# Patient Record
Sex: Male | Born: 1965 | Race: White | Hispanic: No | Marital: Married
Health system: Southern US, Community
[De-identification: ages and names within clinical notes are randomized; demographics above are authoritative.]

## PROBLEM LIST (undated history)

## (undated) DIAGNOSIS — I10 Essential (primary) hypertension: Secondary | ICD-10-CM

## (undated) DIAGNOSIS — I4891 Unspecified atrial fibrillation: Secondary | ICD-10-CM

## (undated) DIAGNOSIS — N289 Disorder of kidney and ureter, unspecified: Secondary | ICD-10-CM

## (undated) DIAGNOSIS — R51 Headache: Secondary | ICD-10-CM

## (undated) DIAGNOSIS — T8859XA Other complications of anesthesia, initial encounter: Secondary | ICD-10-CM

## (undated) DIAGNOSIS — T4145XA Adverse effect of unspecified anesthetic, initial encounter: Secondary | ICD-10-CM

## (undated) DIAGNOSIS — N189 Chronic kidney disease, unspecified: Secondary | ICD-10-CM

## (undated) DIAGNOSIS — D696 Thrombocytopenia, unspecified: Secondary | ICD-10-CM

## (undated) DIAGNOSIS — I770 Arteriovenous fistula, acquired: Secondary | ICD-10-CM

## (undated) DIAGNOSIS — M542 Cervicalgia: Secondary | ICD-10-CM

## (undated) DIAGNOSIS — N186 End stage renal disease: Secondary | ICD-10-CM

## (undated) DIAGNOSIS — K219 Gastro-esophageal reflux disease without esophagitis: Secondary | ICD-10-CM

## (undated) DIAGNOSIS — F419 Anxiety disorder, unspecified: Secondary | ICD-10-CM

## (undated) DIAGNOSIS — D649 Anemia, unspecified: Secondary | ICD-10-CM

## (undated) DIAGNOSIS — F1010 Alcohol abuse, uncomplicated: Secondary | ICD-10-CM

## (undated) DIAGNOSIS — D869 Sarcoidosis, unspecified: Secondary | ICD-10-CM

## (undated) DIAGNOSIS — J189 Pneumonia, unspecified organism: Secondary | ICD-10-CM

## (undated) HISTORY — PX: COLONOSCOPY: SHX174

## (undated) HISTORY — PX: WISDOM TOOTH EXTRACTION: SHX21

## (undated) HISTORY — PX: KNEE ARTHROSCOPY: SUR90

## (undated) HISTORY — PX: AV FISTULA PLACEMENT: SHX1204

## (undated) HISTORY — DX: End stage renal disease: N18.6

## (undated) HISTORY — DX: Cervicalgia: M54.2

## (undated) HISTORY — PX: TONSILLECTOMY: SUR1361

---

## 2006-08-29 ENCOUNTER — Ambulatory Visit (HOSPITAL_BASED_OUTPATIENT_CLINIC_OR_DEPARTMENT_OTHER): Admission: RE | Admit: 2006-08-29 | Discharge: 2006-08-29 | Payer: Self-pay | Admitting: Orthopaedic Surgery

## 2012-09-19 ENCOUNTER — Inpatient Hospital Stay (HOSPITAL_COMMUNITY)
Admission: EM | Admit: 2012-09-19 | Discharge: 2012-09-20 | DRG: 316 | Disposition: A | Discharge status: Home / Self Care | Payer: BC Managed Care – PPO | Attending: Internal Medicine | Admitting: Internal Medicine

## 2012-09-19 ENCOUNTER — Encounter (HOSPITAL_COMMUNITY): Payer: Self-pay

## 2012-09-19 DIAGNOSIS — N179 Acute kidney failure, unspecified: Principal | ICD-10-CM | POA: Diagnosis present

## 2012-09-19 DIAGNOSIS — D709 Neutropenia, unspecified: Secondary | ICD-10-CM | POA: Diagnosis present

## 2012-09-19 DIAGNOSIS — F419 Anxiety disorder, unspecified: Secondary | ICD-10-CM

## 2012-09-19 DIAGNOSIS — E872 Acidosis, unspecified: Secondary | ICD-10-CM | POA: Diagnosis present

## 2012-09-19 DIAGNOSIS — I10 Essential (primary) hypertension: Secondary | ICD-10-CM | POA: Diagnosis present

## 2012-09-19 DIAGNOSIS — M549 Dorsalgia, unspecified: Secondary | ICD-10-CM | POA: Diagnosis present

## 2012-09-19 DIAGNOSIS — E871 Hypo-osmolality and hyponatremia: Secondary | ICD-10-CM | POA: Diagnosis present

## 2012-09-19 DIAGNOSIS — F411 Generalized anxiety disorder: Secondary | ICD-10-CM | POA: Diagnosis present

## 2012-09-19 DIAGNOSIS — D696 Thrombocytopenia, unspecified: Secondary | ICD-10-CM | POA: Diagnosis present

## 2012-09-19 HISTORY — DX: Essential (primary) hypertension: I10

## 2012-09-19 HISTORY — DX: Anxiety disorder, unspecified: F41.9

## 2012-09-19 LAB — URINALYSIS, ROUTINE W REFLEX MICROSCOPIC
Bilirubin Urine: NEGATIVE
Glucose, UA: NEGATIVE mg/dL
Hgb urine dipstick: NEGATIVE
Specific Gravity, Urine: 1.006 (ref 1.005–1.030)
Urobilinogen, UA: 0.2 mg/dL (ref 0.0–1.0)

## 2012-09-19 LAB — COMPREHENSIVE METABOLIC PANEL
ALT: 22 U/L (ref 0–53)
AST: 41 U/L — ABNORMAL HIGH (ref 0–37)
CO2: 15 mEq/L — ABNORMAL LOW (ref 19–32)
Calcium: 10.5 mg/dL (ref 8.4–10.5)
Chloride: 92 mEq/L — ABNORMAL LOW (ref 96–112)
GFR calc non Af Amer: 18 mL/min — ABNORMAL LOW (ref >90)
Sodium: 127 mEq/L — ABNORMAL LOW (ref 135–145)
Total Bilirubin: 0.4 mg/dL (ref 0.3–1.2)

## 2012-09-19 LAB — CBC WITH DIFFERENTIAL/PLATELET
Basophils Absolute: 0 10*3/uL (ref 0.0–0.1)
Lymphocytes Relative: 20 % (ref 12–46)
Neutro Abs: 2 10*3/uL (ref 1.7–7.7)
Platelets: 123 10*3/uL — ABNORMAL LOW (ref 150–400)
RDW: 12.2 % (ref 11.5–15.5)
WBC: 3.2 10*3/uL — ABNORMAL LOW (ref 4.0–10.5)

## 2012-09-19 MED ORDER — ADULT MULTIVITAMIN W/MINERALS CH
1.0000 | ORAL_TABLET | Freq: Every day | ORAL | Status: DC
  Administered 2012-09-19 – 2012-09-20 (×2): 1 via ORAL
  Filled 2012-09-19 (×2): qty 1

## 2012-09-19 MED ORDER — SODIUM BICARBONATE 650 MG PO TABS
650.0000 mg | ORAL_TABLET | Freq: Three times a day (TID) | ORAL | Status: DC
  Administered 2012-09-19 – 2012-09-20 (×2): 650 mg via ORAL
  Filled 2012-09-19 (×5): qty 1

## 2012-09-19 MED ORDER — CYCLOBENZAPRINE HCL 10 MG PO TABS: 10.0000 mg | ORAL_TABLET | Freq: Three times a day (TID) | ORAL | Status: DC | PRN

## 2012-09-19 MED ORDER — VITAMIN B-1 100 MG PO TABS
100.0000 mg | ORAL_TABLET | Freq: Every day | ORAL | Status: DC
  Administered 2012-09-19 – 2012-09-20 (×2): 100 mg via ORAL
  Filled 2012-09-19 (×2): qty 1

## 2012-09-19 MED ORDER — THIAMINE HCL 100 MG/ML IJ SOLN
100.0000 mg | Freq: Every day | INTRAMUSCULAR | Status: DC
  Filled 2012-09-19 (×2): qty 1

## 2012-09-19 MED ORDER — SODIUM CHLORIDE 0.9 % IV SOLN: INTRAVENOUS | Status: DC

## 2012-09-19 MED ORDER — ONDANSETRON HCL 4 MG/2ML IJ SOLN: 4.0000 mg | Freq: Four times a day (QID) | INTRAMUSCULAR | Status: DC | PRN

## 2012-09-19 MED ORDER — LORAZEPAM 2 MG/ML IJ SOLN: 1.0000 mg | Freq: Four times a day (QID) | INTRAMUSCULAR | Status: DC | PRN

## 2012-09-19 MED ORDER — ALBUTEROL SULFATE (5 MG/ML) 0.5% IN NEBU: 2.5000 mg | INHALATION_SOLUTION | RESPIRATORY_TRACT | Status: DC | PRN

## 2012-09-19 MED ORDER — GUAIFENESIN-DM 100-10 MG/5ML PO SYRP
5.0000 mL | ORAL_SOLUTION | ORAL | Status: DC | PRN
  Filled 2012-09-19: qty 5

## 2012-09-19 MED ORDER — ONDANSETRON HCL 4 MG PO TABS: 4.0000 mg | ORAL_TABLET | Freq: Four times a day (QID) | ORAL | Status: DC | PRN

## 2012-09-19 MED ORDER — HYDROCODONE-ACETAMINOPHEN 5-325 MG PO TABS: 1.0000 | ORAL_TABLET | ORAL | Status: DC | PRN

## 2012-09-19 MED ORDER — FOLIC ACID 1 MG PO TABS
1.0000 mg | ORAL_TABLET | Freq: Every day | ORAL | Status: DC
  Administered 2012-09-19 – 2012-09-20 (×2): 1 mg via ORAL
  Filled 2012-09-19 (×2): qty 1

## 2012-09-19 MED ORDER — SODIUM CHLORIDE 0.9 % IV SOLN
INTRAVENOUS | Status: AC
  Administered 2012-09-19: 125 mL via INTRAVENOUS

## 2012-09-19 MED ORDER — SODIUM CHLORIDE 0.9 % IV BOLUS (SEPSIS)
1000.0000 mL | Freq: Once | INTRAVENOUS | Status: AC
  Administered 2012-09-19: 1000 mL via INTRAVENOUS

## 2012-09-19 MED ORDER — ALPRAZOLAM 0.5 MG PO TABS: 1.0000 mg | ORAL_TABLET | Freq: Two times a day (BID) | ORAL | Status: DC | PRN

## 2012-09-19 MED ORDER — CHLORDIAZEPOXIDE HCL 5 MG PO CAPS
10.0000 mg | ORAL_CAPSULE | Freq: Three times a day (TID) | ORAL | Status: DC
  Administered 2012-09-19 – 2012-09-20 (×2): 10 mg via ORAL
  Filled 2012-09-19 (×2): qty 2

## 2012-09-19 MED ORDER — LORAZEPAM 1 MG PO TABS: 1.0000 mg | ORAL_TABLET | Freq: Four times a day (QID) | ORAL | Status: DC | PRN

## 2012-09-19 MED ORDER — SODIUM BICARBONATE 8.4 % IV SOLN
INTRAVENOUS | Status: DC
  Filled 2012-09-19 (×2): qty 100

## 2012-09-19 MED ORDER — HEPARIN SODIUM (PORCINE) 5000 UNIT/ML IJ SOLN
5000.0000 [IU] | Freq: Three times a day (TID) | INTRAMUSCULAR | Status: DC
  Administered 2012-09-19 – 2012-09-20 (×3): 5000 [IU] via SUBCUTANEOUS
  Filled 2012-09-19 (×5): qty 1

## 2012-09-19 NOTE — ED Provider Notes (Signed)
History     CSN: [id number]  Arrival date & time 09/19/12  1253   First MD Initiated Contact with Patient 09/19/12 1523      Chief Complaint  Patient presents with  . Labs Only    (Consider location/radiation/quality/duration/timing/severity/associated sxs/prior treatment) HPI Comments: Patient was sent to the ED by his PCP Shirline Frees after labs in the office revealed a Creatine of 4.07.  Patient presented to the office of his PCP today for a routine 6 month check up.   Patient does not have any previous history of Renal Failure.  He thinks that his Creatine was normal at his check up 6 months ago.  Patient denies regular NSAID use.  He states that he has not been dehydrated recently.  No nausea or vomiting.  He denies SOB.  Denies peripheral edema.  He states that he has been urinating normally.  He denies any new medications.    The history is provided by the patient.    Past Medical History  Diagnosis Date  . Anxiety     No past surgical history on file.  No family history on file.  History  Substance Use Topics  . Smoking status: Not on file  . Smokeless tobacco: Not on file  . Alcohol Use: No      Review of Systems  Constitutional: Negative for fever and chills.  Respiratory: Negative for shortness of breath.   Cardiovascular: Negative for chest pain.  Gastrointestinal: Negative for nausea, vomiting and diarrhea.  Genitourinary: Negative for dysuria, frequency, decreased urine volume and difficulty urinating.    Allergies  Review of patient's allergies indicates no known allergies.  Home Medications   Current Outpatient Rx  Name Route Sig Dispense Refill  . ALPRAZOLAM 1 MG PO TABS Oral Take 1 mg by mouth 2 (two) times daily as needed. For anxiety    . CYCLOBENZAPRINE HCL 10 MG PO TABS Oral Take 10 mg by mouth 3 (three) times daily as needed. For muscle spasms.    Marland Kitchen HYDROCODONE-ACETAMINOPHEN 10-325 MG PO TABS Oral Take 2 tablets by mouth 2 (two) times  daily.    Marland Kitchen LISINOPRIL 20 MG PO TABS Oral Take 20 mg by mouth daily.    Marland Kitchen OVER THE COUNTER MEDICATION Both Eyes Place 2-3 drops into both eyes as needed. Liquid tears eye drops  For itchy/red eyes    . SILDENAFIL CITRATE 50 MG PO TABS Oral Take 50 mg by mouth daily as needed. For erectile dysfunction      BP 152/95  Pulse 100  Temp 97.8 F (36.6 C) (Oral)  Resp 20  SpO2 100%  Physical Exam  Nursing note and vitals reviewed. Constitutional: He appears well-developed and well-nourished. No distress.  HENT:  Head: Normocephalic and atraumatic.  Mouth/Throat: Oropharynx is clear and moist.  Neck: Normal range of motion. Neck supple.  Cardiovascular: Normal rate, regular rhythm and normal heart sounds.        Very mild non pitting edema bilaterally  Pulmonary/Chest: Effort normal and breath sounds normal.  Abdominal: Soft.  Musculoskeletal: Normal range of motion.  Neurological: He is alert.  Skin: Skin is warm and dry. He is not diaphoretic.  Psychiatric: He has a normal mood and affect.    ED Course  Procedures (including critical care time)  Labs Reviewed  CBC WITH DIFFERENTIAL - Abnormal; Notable for the following:    WBC 3.2 (*)     Platelets 123 (*)     All other components within normal  limits  COMPREHENSIVE METABOLIC PANEL - Abnormal; Notable for the following:    Sodium 127 (*)     Chloride 92 (*)     CO2 15 (*)     BUN 47 (*)     Creatinine, Ser 3.73 (*)     Total Protein 8.5 (*)     AST 41 (*)     GFR calc non Af Amer 18 (*)     GFR calc Af Amer 21 (*)     All other components within normal limits  URINALYSIS, ROUTINE W REFLEX MICROSCOPIC   No results found.   No diagnosis found.  4:29 PM Discussed with Dr. [name] with [facility] who has agreed to admit the patient.  MDM  Patient presenting today with new onset ARF of unknown etiology.  Creatine of 3.73 in the ED.  Patient is completely asymptomatic.  Patient admitted to [facility] for  further evaluation of ARF.        Sherlyn Lees [place], PA-C 09/20/12 [phone]

## 2012-09-19 NOTE — Progress Notes (Signed)
Pt arrived to the unit via wheel chair. Pt alert and oriented x4. Ambulatory. Pt noted with abrasions on bilateral knees. Pt denies any discomfort. No distress noted. Pt oriented to the unit and staff. Will cont to monitor.

## 2012-09-19 NOTE — ED Notes (Signed)
Admit MD at bedside

## 2012-09-19 NOTE — H&P (Signed)
[facility]                                                                                    Patient Demographics  Steven Best, is a 46 y.o. male  CSN: [id number]  MRN: [id number]  DOB - September 10, 1966  Admit Date - 09/19/2012  Outpatient Primary MD for the patient is Shirline Frees, MD   With History of -  Past Medical History  Diagnosis Date  . Anxiety   . HTN (hypertension)       No past surgical history on file.  in for   Chief Complaint  Patient presents with  . Labs Only     HPI  Steven Best  is a 46 y.o. male,was started on lisinopril in February of this year, at that time his creatinine was noted to be 1.26 per his primary care physician Dr. Kenton Kingfisher, presents to the ER after on routine lab testing he was found to have acute renal failure. Patient himself does not have any subjective complaints, in the ER lab work again confirms acute renal failure along with metabolic acidosis and I was called to admit the patient. Patient has completely negative review of systems.he denies any NSAID use.    Review of Systems    In addition to the HPI above,  No Fever-chills, No Headache, No changes with Vision or hearing, No problems swallowing food or Liquids, No Chest pain, Cough or Shortness of Breath, No Abdominal pain, No Nausea or Vommitting, Bowel movements are regular, No Blood in stool or Urine, No dysuria, No new skin rashes or bruises, No new joints pains-aches,  No new weakness, tingling, numbness in any extremity, +ve weight loss, No polyuria, polydypsia or polyphagia, No significant Mental Stressors.  A full 10 point Review of Systems was done, except as stated above, all other Review of Systems were negative.   Social History History  Substance Use Topics  . Smoking status: Not on file  . Smokeless tobacco: Not on file  . Alcohol Use: Takes 1 to 2 alcoholic drinks everyday     Family History No ESRD  Prior to  Admission medications   Medication Sig Start Date End Date Taking? Authorizing Provider  ALPRAZolam Duanne Moron) 1 MG tablet Take 1 mg by mouth 2 (two) times daily as needed. For anxiety   Yes Historical Provider, MD  cyclobenzaprine (FLEXERIL) 10 MG tablet Take 10 mg by mouth 3 (three) times daily as needed. For muscle spasms.   Yes Historical Provider, MD  HYDROcodone-acetaminophen (NORCO) 10-325 MG per tablet Take 2 tablets by mouth 2 (two) times daily.   Yes Historical Provider, MD  lisinopril (PRINIVIL,ZESTRIL) 20 MG tablet Take 20 mg by mouth daily.   Yes Historical Provider, MD  OVER THE COUNTER MEDICATION Place 2-3 drops into both eyes as needed. Liquid tears eye drops  For itchy/red eyes   Yes Historical Provider, MD  sildenafil (VIAGRA) 50 MG tablet Take 50 mg by mouth daily as needed. For erectile dysfunction   Yes Historical Provider, MD    No Known Allergies  Physical Exam  Vitals  Blood pressure 115/76, pulse 97, temperature 97.8 F (36.6 C),  temperature source Oral, resp. rate 20, SpO2 96.00%.   1. General well-built Caucasian male lying in bed in NAD,    2. Normal affect and insight, Not Suicidal or Homicidal, Awake Alert, Oriented X 3.  3. No F.N deficits, ALL C.Nerves Intact, Strength 5/5 all 4 extremities, Sensation intact all 4 extremities, Plantars down going.  4. Ears and Eyes appear Normal, Conjunctivae clear, PERRLA. Moist Oral Mucosa.  5. Supple Neck, No JVD, No cervical lymphadenopathy appriciated, No Carotid Bruits.  6. Symmetrical Chest wall movement, Good air movement bilaterally, CTAB.  7. RRR, No Gallops, Rubs or Murmurs, No Parasternal Heave.  8. Positive Bowel Sounds, Abdomen Soft, Non tender, No organomegaly appriciated,No rebound -guarding or rigidity.  9.  No Cyanosis, Normal Skin Turgor, No Skin Rash or Bruise.  10. Good muscle tone,  joints appear normal , no effusions, Normal ROM.  11. No Palpable Lymph Nodes in Neck or Axillae    Data  Review  CBC  Lab 09/19/12 1338  WBC 3.2*  HGB 15.6  HCT 43.7  PLT 123*  MCV 93.8  MCH 33.5  MCHC 35.7  RDW 12.2  LYMPHSABS 0.7  MONOABS 0.4  EOSABS 0.1  BASOSABS 0.0  BANDABS --   ------------------------------------------------------------------------------------------------------------------  Chemistries   Lab 09/19/12 1338  NA 127*  K 5.0  CL 92*  CO2 15*  GLUCOSE 90  BUN 47*  CREATININE 3.73*  CALCIUM 10.5  MG --  AST 41*  ALT 22  ALKPHOS 68  BILITOT 0.4    Urinalysis    Component Value Date/Time   COLORURINE YELLOW 09/19/2012 1434   APPEARANCEUR CLEAR 09/19/2012 1434   LABSPEC 1.006 09/19/2012 1434   PHURINE 5.5 09/19/2012 1434   GLUCOSEU NEGATIVE 09/19/2012 1434   HGBUR NEGATIVE 09/19/2012 1434   BILIRUBINUR NEGATIVE 09/19/2012 1434   [place] 09/19/2012 1434   PROTEINUR NEGATIVE 09/19/2012 1434   UROBILINOGEN 0.2 09/19/2012 1434   NITRITE NEGATIVE 09/19/2012 1434   LEUKOCYTESUR NEGATIVE 09/19/2012 1434       Assessment & Plan  1.Acute renal failure likely secondary to lisinopril - lisinopril was started in February at which time his creatinine was 1.26, patient will be admitted to [facility] bed, UA with urine electrolytes,CK will be ordered, renal artery ultrasound to rule out renal artery stenosis, IV fluids along with oral sodium bicarbonate as he has concomitant metabolic acidosis, repeat BMP in the morning if not improved renal will be consulted.   2. Hyponatremia - likely due to #1 above along with questioned prenatal ischemia, serum osmolality, urine sodium and osmolality will be ordered, IV fluids for now repeat BMP in the morning.   3.History of alcohol use - unclear how much alcohol he uses, have counseled him not to drink excessively at home, schedule Librium along with CIWA protocol, folic acid and thiamine.   4.Chronic back pain. No acute issues home dose Flexeril with hydrocodone will be continued.    DVT  Prophylaxis Heparin   AM Labs Ordered, also please review Full Orders  Family Communication: Admission, patients condition and plan of care including tests being ordered have been discussed with the patient, wife, son who indicate understanding and agree with the plan and Code Status.  Code Status Full  Disposition Plan: Home  Time spent in minutes : 35  Condition Jill Side K M.D on 09/19/2012 at 4:37 PM  Between 7am to 7pm - Pager - [phone]  After 7pm go to [email or web address] - password [email or web address]  And look for the  night coverage person covering me after hours  [facility] Office  [phone]

## 2012-09-19 NOTE — ED Notes (Signed)
Sent by md for abn kidney labs, sts had blood drawn today  Received a call after he got home and tld to come for kidney functions

## 2012-09-19 NOTE — ED Notes (Signed)
PATIENT STATES HE WENT TO [facility] TO HAVE HIS ROUTINE VISIT. THE DOCTOR DREW LABS PER NORMAL AND STATES THAT HE FEELS FINE. STATES HE WENT HOME AFTER DR VISIT AND WAS DRINKING A FEW BEERS AND THE NURSE CALLED AND TOLD HIM TO COME TO THE EMERGENCY DEPARTMENT DUE TO PROBLEM WITH THE FINDINGS ON HIS LAB WORK

## 2012-09-20 ENCOUNTER — Encounter (HOSPITAL_COMMUNITY): Payer: Self-pay | Admitting: Emergency Medicine

## 2012-09-20 DIAGNOSIS — N19 Unspecified kidney failure: Secondary | ICD-10-CM

## 2012-09-20 LAB — BASIC METABOLIC PANEL
BUN: 40 mg/dL — ABNORMAL HIGH (ref 6–23)
CO2: 22 mEq/L (ref 19–32)
CO2: 22 mEq/L (ref 19–32)
Calcium: 10.5 mg/dL (ref 8.4–10.5)
Calcium: 10.3 mg/dL (ref 8.4–10.5)
Creatinine, Ser: 3.36 mg/dL — ABNORMAL HIGH (ref 0.50–1.35)
Creatinine, Ser: 3.21 mg/dL — ABNORMAL HIGH (ref 0.50–1.35)
Glucose, Bld: 91 mg/dL (ref 70–99)
Glucose, Bld: 112 mg/dL — ABNORMAL HIGH (ref 70–99)

## 2012-09-20 LAB — URINE CULTURE
Colony Count: NO GROWTH
Culture: NO GROWTH

## 2012-09-20 LAB — CBC
MCH: 33.4 pg (ref 26.0–34.0)
MCV: 94.9 fL (ref 78.0–100.0)
Platelets: 113 10*3/uL — ABNORMAL LOW (ref 150–400)
RDW: 12.3 % (ref 11.5–15.5)

## 2012-09-20 MED ORDER — PNEUMOCOCCAL VAC POLYVALENT 25 MCG/0.5ML IJ INJ: 0.5000 mL | INJECTION | INTRAMUSCULAR | Status: DC

## 2012-09-20 MED ORDER — FOLIC ACID 1 MG PO TABS: 1.0000 mg | ORAL_TABLET | Freq: Every day | ORAL | Status: DC

## 2012-09-20 MED ORDER — THIAMINE HCL 100 MG PO TABS: 100.0000 mg | ORAL_TABLET | Freq: Every day | ORAL | Status: DC

## 2012-09-20 MED ORDER — AMLODIPINE BESYLATE 10 MG PO TABS: 10.0000 mg | ORAL_TABLET | Freq: Every day | ORAL | Status: DC

## 2012-09-20 MED ORDER — SIMETHICONE 80 MG PO CHEW
160.0000 mg | CHEWABLE_TABLET | Freq: Four times a day (QID) | ORAL | Status: DC | PRN
  Administered 2012-09-20: 160 mg via ORAL
  Filled 2012-09-20: qty 2

## 2012-09-20 NOTE — Progress Notes (Signed)
Pt. Got d/c instructions and prescriptions,IV was removed.pt. Ready to go home.

## 2012-09-20 NOTE — Progress Notes (Signed)
*  PRELIMINARY RESULTS* Vascular Ultrasound Renal Artery Duplex has been completed.  There is no obvious evidence of renal artery stenosis bilaterally.   09/20/2012 4:50 PM Maudry Mayhew, RDMS, RDCS

## 2012-09-20 NOTE — Discharge Summary (Signed)
Physician Discharge Summary  Oleh Keath [id number] DOB: 08-29-1966 DOA: 09/19/2012  PCP: Shirline Frees, MD  Admit date: 09/19/2012 Discharge date: 09/20/2012    Recommendations for Outpatient Follow-up:  1. Repeat BMET and CBC in 1 week   Discharge Diagnoses:   Anxiety  HTN (hypertension)  ARF (acute renal failure) vs CKD - ? Due to acei use - asymptomatic - if creatinine remains elevated 1 week after stopping acei - we recommend outpatient nephrology visit Neutropenia and Thrombocytopenia - ? Due to alcohol use    Discharge Condition: good  Diet recommendation: low salt , no alcohol   Filed Weights   09/19/12 2048  Weight: 137.395 kg (302 lb 14.4 oz)    History of present illness:  Steven Best is a 46 y.o. male,was started on lisinopril in February of this year, at that time his creatinine was noted to be 1.26 per his primary care physician Dr. Kenton Kingfisher, presents to the ER after on routine lab testing he was found to have acute renal failure. Patient himself does not have any subjective complaints, in the ER lab work again confirms acute renal failure along with metabolic acidosis and I was called to admit the patient. Patient has completely negative review of systems.he denies any NSAID use.   Hospital Course:  1. Renal failure -the patient was admitted after he was found to have an elevated creatinine and metabolic acidosis at a routine basic metabolic profile. He was asymptomatic. His urinary output was good. Urinalysis from October 23 was negative for white blood cells, red blood cells, protein - effectively ruling out glomerulonephritis, interstitial nephritis. Renal duplex ultrasound was negative for renal artery stenosis. The patient was observed in the hospital for 24 hours and we have noticed that his creatinine remains stable at 3.2 Lisinopril was discontinued and the patient will followup with his primary care physician for repeat basic metabolic profile  in one week. If creatinine remains elevated he needs referral for outpatient visit with nephrology. 2. mild neutropenia and thrombocytopenia - suspect related to alcohol use. Patient is afebrile, without any signs of bleeding. We recommended he cut down on his alcohol consumption and to repeat CBC in the office in one week.  Procedures: Renal duplex US   Consultations:  none  Discharge Exam: Filed Vitals:   09/19/12 2048 09/20/12 0629 09/20/12 0946 09/20/12 1418  BP: 131/69 132/85 127/74 144/87  Pulse: 86 79 90 83  Temp: 97.8 F (36.6 C) 98.1 F (36.7 C) 97.6 F (36.4 C) 98 F (36.7 C)  TempSrc: Oral Oral Oral Oral  Resp: 20 20 20 20   Height: 6\' 4"  (1.93 m)     Weight: 137.395 kg (302 lb 14.4 oz)     SpO2: 97% 95% 100% 99%    General: axox3 Cardiovascular: rrr Respiratory: ctab  Discharge Instructions  Discharge Orders    Future Orders Please Complete By Expires   Diet - low sodium heart healthy      Increase activity slowly          Medication List     As of 09/20/2012  5:27 PM    STOP taking these medications         lisinopril 20 MG tablet   Commonly known as: PRINIVIL,ZESTRIL      TAKE these medications         ALPRAZolam 1 MG tablet   Commonly known as: XANAX   Take 1 mg by mouth 2 (two) times daily as needed. For anxiety  amLODipine 10 MG tablet   Commonly known as: NORVASC   Take 1 tablet (10 mg total) by mouth daily.      cyclobenzaprine 10 MG tablet   Commonly known as: FLEXERIL   Take 10 mg by mouth 3 (three) times daily as needed. For muscle spasms.      folic acid 1 MG tablet   Commonly known as: FOLVITE   Take 1 tablet (1 mg total) by mouth daily.      HYDROcodone-acetaminophen 10-325 MG per tablet   Commonly known as: NORCO   Take 2 tablets by mouth 2 (two) times daily.      OVER THE COUNTER MEDICATION   Place 2-3 drops into both eyes as needed. Liquid tears eye drops    For itchy/red eyes      sildenafil 50 MG tablet    Commonly known as: VIAGRA   Take 50 mg by mouth daily as needed. For erectile dysfunction      thiamine 100 MG tablet   Take 1 tablet (100 mg total) by mouth daily.           Follow-up Information    Follow up with Shirline Frees, MD. Schedule an appointment as soon as possible for a visit in 7 days.   Contact information:   [facility] [place] [address] [phone]           The results of significant diagnostics from this hospitalization (including imaging, microbiology, ancillary and laboratory) are listed below for reference.    Significant Diagnostic Studies: No results found.  Microbiology: No results found for this or any previous visit (from the past 240 hour(s)).   Labs: Basic Metabolic Panel:  Lab [id number] 09/20/12 0535 09/19/12 1338  NA 136 137 127*  K 5.1 5.4* 5.0  CL 102 102 92*  CO2 22 22 15*  GLUCOSE 112* 91 90  BUN 40* 43* 47*  CREATININE 3.21* 3.36* 3.73*  CALCIUM 10.3 10.5 10.5  MG -- -- --  PHOS -- -- --   Liver Function Tests:  Lab 09/19/12 1338  AST 41*  ALT 22  ALKPHOS 68  BILITOT 0.4  PROT 8.5*  ALBUMIN 4.1   No results found for this basename: LIPASE:5,AMYLASE:5 in the last 168 hours No results found for this basename: AMMONIA:5 in the last 168 hours CBC:  Lab 09/20/12 0535 09/19/12 1338  WBC 2.1* 3.2*  NEUTROABS -- 2.0  HGB 14.5 15.6  HCT 41.2 43.7  MCV 94.9 93.8  PLT 113* 123*   Cardiac Enzymes:  Lab 09/19/12 1629  CKTOTAL 29  CKMB --  CKMBINDEX --  TROPONINI --   BNP: BNP (last 3 results) No results found for this basename: PROBNP:3 in the last 8760 hours CBG: No results found for this basename: GLUCAP:5 in the last 168 hours   Time spent 45 minutes  Signed:  Dyllan Kats  [facility] 09/20/2012, 5:27 PM

## 2012-09-25 NOTE — ED Provider Notes (Signed)
Medical screening examination/treatment/procedure(s) were performed by non-physician practitioner and as supervising physician I was immediately available for consultation/collaboration.  Virgel Manifold, MD 09/25/12 [phone]

## 2013-04-02 ENCOUNTER — Telehealth: Payer: Self-pay | Admitting: Oncology

## 2013-04-02 NOTE — Telephone Encounter (Signed)
S/W PT IN RE NP APPT 06/04 @ 10:30 W/DR. [place], Renea Ee PACKET MAILED.

## 2013-04-03 ENCOUNTER — Telehealth: Payer: Self-pay | Admitting: Oncology

## 2013-04-03 NOTE — Telephone Encounter (Signed)
C/D 04/03/13 for appt. 05/01/13

## 2013-04-26 ENCOUNTER — Other Ambulatory Visit: Payer: Self-pay | Admitting: Oncology

## 2013-04-26 DIAGNOSIS — D696 Thrombocytopenia, unspecified: Secondary | ICD-10-CM

## 2013-05-01 ENCOUNTER — Ambulatory Visit (HOSPITAL_BASED_OUTPATIENT_CLINIC_OR_DEPARTMENT_OTHER): Payer: BC Managed Care – PPO | Admitting: Oncology

## 2013-05-01 ENCOUNTER — Ambulatory Visit (HOSPITAL_BASED_OUTPATIENT_CLINIC_OR_DEPARTMENT_OTHER): Payer: BC Managed Care – PPO

## 2013-05-01 ENCOUNTER — Telehealth: Payer: Self-pay | Admitting: Oncology

## 2013-05-01 ENCOUNTER — Other Ambulatory Visit (HOSPITAL_BASED_OUTPATIENT_CLINIC_OR_DEPARTMENT_OTHER): Payer: BC Managed Care – PPO | Admitting: Lab

## 2013-05-01 VITALS — BP 162/101 | HR 77 | Temp 96.7°F | Resp 18 | Ht 76.0 in | Wt 262.6 lb

## 2013-05-01 DIAGNOSIS — D696 Thrombocytopenia, unspecified: Secondary | ICD-10-CM

## 2013-05-01 DIAGNOSIS — N289 Disorder of kidney and ureter, unspecified: Secondary | ICD-10-CM

## 2013-05-01 DIAGNOSIS — D72819 Decreased white blood cell count, unspecified: Secondary | ICD-10-CM

## 2013-05-01 DIAGNOSIS — D649 Anemia, unspecified: Secondary | ICD-10-CM

## 2013-05-01 LAB — CBC WITH DIFFERENTIAL/PLATELET
Eosinophils Absolute: 0.1 10*3/uL (ref 0.0–0.5)
HCT: 32 % — ABNORMAL LOW (ref 38.4–49.9)
LYMPH%: 13.1 % — ABNORMAL LOW (ref 14.0–49.0)
MONO#: 0.5 10*3/uL (ref 0.1–0.9)
NEUT#: 2.1 10*3/uL (ref 1.5–6.5)
NEUT%: 67.4 % (ref 39.0–75.0)
Platelets: 110 10*3/uL — ABNORMAL LOW (ref 140–400)
RBC: 3.26 10*6/uL — ABNORMAL LOW (ref 4.20–5.82)
WBC: 3.2 10*3/uL — ABNORMAL LOW (ref 4.0–10.3)

## 2013-05-01 LAB — COMPREHENSIVE METABOLIC PANEL (CC13)
CO2: 17 mEq/L — ABNORMAL LOW (ref 22–29)
Glucose: 89 mg/dl (ref 70–99)
Sodium: 137 mEq/L (ref 136–145)
Total Bilirubin: 0.59 mg/dL (ref 0.20–1.20)
Total Protein: 8.1 g/dL (ref 6.4–8.3)

## 2013-05-01 LAB — CHCC SMEAR

## 2013-05-01 NOTE — Progress Notes (Signed)
Reason for Referral: Pancytopenia.   HPI: Steven Best is a pleasant 47 year old gentleman native of [place] and currently living in [place] in the last 20 years. He is a gentleman with a past medical history significant for hypertension anxiety as well as obesity. He was in his normal state of health where he presented with an acute illness including a upper respiratory symptoms and found to have acute renal failure and required hospitalization in October of 2013. His acute renal injury have stabilized and have been followed by nephrology since that time. He has not required any intervention including dialysis. During that hospitalization, he was noted to have thrombocytopenia with a platelet counts have ranged 120 with mild leukocytopenia with a white cell count around 3000. He's been followed by his primary care physician very closely and his most recent CBC on 03/21/2013 it was noted his white cell count was 3.3 his hemoglobin was 11.5 and his platelet count was 96,000. His lymphocyte percentage was slightly low at 15.5 and he had slight monocytosis. His MCV was slightly elevated at 97.9. His MCV have been chronically as high as 100.8.  Patient referred to me for evaluation for his pancytopenia. Clinically, he is asymptomatic he had not reported any bleeding problems had not reported any fatigue or tiredness had not report any constitutional symptoms had not reported any opportunistic infection. He is continued to work full time and continue to be active and perform most activities of daily living without any hindrance or decline. He is continued to drink alcohol daily about 2-3 beers a day close to 5-7 days a week.   Past Medical History  Diagnosis Date  . Anxiety   . HTN (hypertension)   :  Current Outpatient Prescriptions  Medication Sig Dispense Refill  . ALPRAZolam (XANAX) 1 MG tablet Take 1 mg by mouth 2 (two) times daily as needed. For anxiety      . cyclobenzaprine  (FLEXERIL) 10 MG tablet Take 10 mg by mouth 3 (three) times daily as needed. For muscle spasms.      . folic acid (FOLVITE) 1 MG tablet Take 1 tablet (1 mg total) by mouth daily.  30 tablet  0  . HYDROcodone-acetaminophen (NORCO) 10-325 MG per tablet Take 2 tablets by mouth 2 (two) times daily.      Marland Kitchen OVER THE COUNTER MEDICATION Place 2-3 drops into both eyes as needed. Liquid tears eye drops  For itchy/red eyes      . thiamine 100 MG tablet Take 100 mg by mouth daily. Vitamin B-1       No current facility-administered medications for this visit.      No Known Allergies:  Family history: His mother has history of heart disease and his father deceased and 02-21-2001. No history of blood disorders noted in his family   History   Social History  . Marital Status: Married    Spouse Name: N/A    Number of Children: N/A  . Years of Education: N/A   Occupational History  . Not on file.   Social History Main Topics  . Smoking status: Former Smoker -- 2.00 packs/day for 16 years    Types: Cigarettes    Quit date: 09/20/1996  . Smokeless tobacco: Never Used  . Alcohol Use: 20.9 oz/week    24 Cans of beer, 5 Shots of liquor, 7 Drinks containing 0.5 oz of alcohol, 0 Glasses of wine per week  . Drug Use: No  . Sexually Active: Yes  Other Topics Concern  . Not on file   Social History Narrative  . No narrative on file  :  A comprehensive review of systems was negative.  Exam: Blood pressure 162/101, pulse 77, temperature 96.7 F (35.9 C), temperature source Oral, resp. rate 18, height 6\' 4"  (1.93 m), weight 262 lb 9.6 oz (119.115 kg). General appearance: alert and cooperative Head: Normocephalic, without obvious abnormality, atraumatic Throat: lips, mucosa, and tongue normal; teeth and gums normal Neck: no adenopathy, no carotid bruit, no JVD, supple, symmetrical, trachea midline and thyroid not enlarged, symmetric, no tenderness/mass/nodules Resp: clear to auscultation  bilaterally Chest wall: no tenderness Cardio: regular rate and rhythm, S1, S2 normal, no murmur, click, rub or gallop GI: soft, non-tender; bowel sounds normal; no masses,  no organomegaly Extremities: extremities normal, atraumatic, no cyanosis or edema Skin: Skin color, texture, turgor normal. No rashes or lesions Lymph nodes: Cervical, supraclavicular, and axillary nodes normal.   Recent Labs  05/01/13 1033  WBC 3.2*  HGB 11.0*  HCT 32.0*  PLT 110*      Blood smear review: Hypochromia was noted in the red cells without any evidence of red cell fragmentation or schistocytes. No platelet clumping was noted the white cells appeared normal with a normal differential.  Assessment and Plan:   47 year old gentleman with the following issues:  1. Leukopenia and thrombocytopenia: These have been relatively mild and chronic in nature dating back to at least 2012. The differential diagnosis discussed today including medication, alcohol intake, myelosuppression due to liver disease and possibly sequestration from portal hypertension. A lymphoproliferative disorder is also a possibility but I think it is unlikely at this time. Certainly his excessive alcohol intake could cause myelosuppression and consequently can cause liver disease and portal hypertension bleeding to these laboratory results. Autoimmune disorders as well as HIV and also manifests with lymphocytopenia and mild thrombocytopenia.  In terms of management, he is counseled been relatively stable and he is asymptomatic so continued followup for the time being and will consider bone marrow biopsy as well as imaging studies occluding complete body CT scan if his counts drop any further. I will also consider viral serology if that has not been done in the past. Discussed today are actually stable and his blood counts is back to his normal baseline at this time.  2. Mild anemia: The differential diagnosis is similar to the one discussed  above but also a plasma cell disorder as well as anemia due to renal disease is probably a consideration. I will obtain a serum protein electrophoresis as well as [id number] folic acid and iron levels to complete the workup.   Have asked him to come back to recheck his counts in about 4 months. All his questions were answered today.

## 2013-09-03 ENCOUNTER — Other Ambulatory Visit: Payer: Self-pay | Admitting: *Deleted

## 2013-09-03 DIAGNOSIS — N184 Chronic kidney disease, stage 4 (severe): Secondary | ICD-10-CM

## 2013-09-03 DIAGNOSIS — Z01810 Encounter for preprocedural cardiovascular examination: Secondary | ICD-10-CM

## 2013-09-06 ENCOUNTER — Telehealth: Payer: Self-pay | Admitting: Oncology

## 2013-09-06 NOTE — Telephone Encounter (Signed)
Pt called today to cx 101/14 lb/fu. Pt being worked up @ [facility] for kidney transplant next wk and doesn't see any need in coming here also. Message to FS.

## 2013-09-10 ENCOUNTER — Ambulatory Visit: Payer: BC Managed Care – PPO | Admitting: Oncology

## 2013-09-10 ENCOUNTER — Other Ambulatory Visit: Payer: BC Managed Care – PPO | Admitting: Lab

## 2013-09-19 ENCOUNTER — Encounter: Payer: Self-pay | Admitting: Vascular Surgery

## 2013-09-20 ENCOUNTER — Other Ambulatory Visit (HOSPITAL_COMMUNITY): Payer: BC Managed Care – PPO

## 2013-09-20 ENCOUNTER — Ambulatory Visit: Payer: BC Managed Care – PPO | Admitting: Vascular Surgery

## 2013-09-20 ENCOUNTER — Encounter (HOSPITAL_COMMUNITY): Payer: BC Managed Care – PPO

## 2013-10-01 ENCOUNTER — Encounter: Payer: Self-pay | Admitting: Vascular Surgery

## 2013-10-02 ENCOUNTER — Encounter (INDEPENDENT_AMBULATORY_CARE_PROVIDER_SITE_OTHER): Payer: Self-pay

## 2013-10-02 ENCOUNTER — Ambulatory Visit (HOSPITAL_COMMUNITY)
Admission: RE | Admit: 2013-10-02 | Discharge: 2013-10-02 | Disposition: A | Discharge status: Home / Self Care | Payer: BC Managed Care – PPO | Source: Ambulatory Visit | Attending: Vascular Surgery | Admitting: Vascular Surgery

## 2013-10-02 ENCOUNTER — Ambulatory Visit (INDEPENDENT_AMBULATORY_CARE_PROVIDER_SITE_OTHER): Payer: BC Managed Care – PPO | Admitting: Vascular Surgery

## 2013-10-02 ENCOUNTER — Ambulatory Visit (INDEPENDENT_AMBULATORY_CARE_PROVIDER_SITE_OTHER)
Admission: RE | Admit: 2013-10-02 | Discharge: 2013-10-02 | Disposition: A | Discharge status: Home / Self Care | Payer: BC Managed Care – PPO | Source: Ambulatory Visit | Attending: Vascular Surgery | Admitting: Vascular Surgery

## 2013-10-02 ENCOUNTER — Encounter: Payer: Self-pay | Admitting: Vascular Surgery

## 2013-10-02 VITALS — BP 123/80 | HR 88 | Ht 76.0 in | Wt 269.0 lb

## 2013-10-02 DIAGNOSIS — Z01810 Encounter for preprocedural cardiovascular examination: Secondary | ICD-10-CM | POA: Insufficient documentation

## 2013-10-02 DIAGNOSIS — N184 Chronic kidney disease, stage 4 (severe): Secondary | ICD-10-CM

## 2013-10-02 DIAGNOSIS — N186 End stage renal disease: Secondary | ICD-10-CM | POA: Insufficient documentation

## 2013-10-02 NOTE — Assessment & Plan Note (Signed)
This patient appears to be a good candidate for a left upper extremity AV fistula. This has been scheduled for 10/18/2013. I have explained the indications for placement of an AV fistula. I've explained that if at all possible we will place an AV fistula.  I have reviewed the risks of placement of an AV fistula including but not limited to: failure of the fistula to mature, need for subsequent interventions, and thrombosis. All the patient's questions were answered and they are agreeable to proceed with surgery.

## 2013-10-02 NOTE — Progress Notes (Signed)
[facility] [place]  Patient name: Steven Best MRN: [id number] DOB: [birth year] Sex: male  REASON FOR CONSULT: Evaluate for hemodialysis access. Referred by Dr. Vanetta Mulders.  HPI: Steven Best is a 47 y.o. male who is not yet on dialysis. He states that he had problems with hypertension and was started on lisinopril. He later developed renal insufficiency. He plans on doing peritoneal dialysis but we were asked to place a fistula for a backup. He is right-handed. He denies any recent uremic symptoms. Specifically, he denies nausea, vomiting, fatigue, anorexia, or palpitations.  I have reviewed his records from [facility]. He is felt to be a good candidate for peritoneal dialysis. The fistula is for a backup. He is also apparently going to be scheduled for evaluation for transplant. His blood pressure has been under good control.    Past Medical History  Diagnosis Date  . Anxiety   . HTN (hypertension)    Family History  Problem Relation Age of Onset  . Diabetes Mother   . Heart disease Mother     before age 66  . Hyperlipidemia Mother   . Hypertension Mother   . Diabetes Father   . Heart disease Father     before age 54  . Hyperlipidemia Father   . Hypertension Father   . Cancer Sister   . Hyperlipidemia Sister   . Hypertension Sister    SOCIAL HISTORY: History  Substance Use Topics  . Smoking status: Former Smoker -- 2.00 packs/day for 16 years    Types: Cigarettes    Quit date: 09/20/1996  . Smokeless tobacco: Never Used  . Alcohol Use: 4.8 oz/week    4 Cans of beer, 4 Shots of liquor, 0 Glasses of wine per week   No Known Allergies Current Outpatient Prescriptions  Medication Sig Dispense Refill  . ALPRAZolam (XANAX) 1 MG tablet Take 1 mg by mouth 2 (two) times daily as needed. For anxiety      . amLODipine (NORVASC) 10 MG tablet Take 10 mg by mouth daily. Take 1/2 tablet daily      . cyclobenzaprine (FLEXERIL)  10 MG tablet Take 10 mg by mouth 3 (three) times daily as needed. For muscle spasms.      . folic acid (FOLVITE) 1 MG tablet Take 1 tablet (1 mg total) by mouth daily.  30 tablet  0  . HYDROcodone-acetaminophen (NORCO) 10-325 MG per tablet Take 2 tablets by mouth 2 (two) times daily.      Marland Kitchen OVER THE COUNTER MEDICATION Place 2-3 drops into both eyes as needed. Liquid tears eye drops  For itchy/red eyes      . thiamine 100 MG tablet Take 100 mg by mouth daily. Vitamin B-1       No current facility-administered medications for this visit.   REVIEW OF SYSTEMS: Valu.Nieves ] denotes positive finding; [  ] denotes negative finding  CARDIOVASCULAR:  [ ]  chest pain   [ ]  chest pressure   [ ]  palpitations   [ ]  orthopnea   [ ]  dyspnea on exertion   [ ]  claudication   [ ]  rest pain   [ ]  DVT   [ ]  phlebitis PULMONARY:   [ ]  productive cough   [ ]  asthma   [ ]  wheezing NEUROLOGIC:   [ ]  weakness  [ ]  paresthesias  [ ]  aphasia  [ ]  amaurosis  [ ]  dizziness HEMATOLOGIC:   [ ]  bleeding problems   [ ]   clotting disorders MUSCULOSKELETAL:  [ ]  joint pain   [ ]  joint swelling [ ]  leg swelling GASTROINTESTINAL: [ ]   blood in stool  [ ]   hematemesis GENITOURINARY:  [ ]   dysuria  [ ]   hematuria PSYCHIATRIC:  [ ]  history of major depression INTEGUMENTARY:  [ ]  rashes  [ ]  ulcers CONSTITUTIONAL:  [ ]  fever   [ ]  chills  PHYSICAL EXAM: Filed Vitals:   10/02/13 1458  BP: 123/80  Pulse: 88  Height: 6\' 4"  (1.93 m)  Weight: 269 lb (122.018 kg)  SpO2: 100%   Body mass index is 32.76 kg/(m^2). GENERAL: The patient is a well-nourished male, in no acute distress. The vital signs are documented above. CARDIOVASCULAR: There is a regular rate and rhythm. Do not detect carotid bruits. He has palpable brachial and radial pulses bilaterally. PULMONARY: There is good air exchange bilaterally without wheezing or rales. ABDOMEN: Soft and non-tender with normal pitched bowel sounds.  MUSCULOSKELETAL: There are no major  deformities or cyanosis. NEUROLOGIC: No focal weakness or paresthesias are detected. SKIN: There are no ulcers or rashes noted. PSYCHIATRIC: The patient has a normal affect.  DATA:  I have independently interpreted his arterial Doppler study today. He has triphasic Doppler signals in the radial and ulnar positions bilaterally. Brachial artery diameter on the right is 0.57 cm. Brachial artery diameter on the left is 0.61 cm.  I have independently interpreted his vein mapping. His forearm and upper arm cephalic vein in both arms appeared be reasonable for a fistula. Likewise both basilic veins appear to be reasonable for a basilic vein transposition.  MEDICAL ISSUES:  End stage renal disease This patient appears to be a good candidate for a left upper extremity AV fistula. This has been scheduled for 10/18/2013. I have explained the indications for placement of an AV fistula. I've explained that if at all possible we will place an AV fistula.  I have reviewed the risks of placement of an AV fistula including but not limited to: failure of the fistula to mature, need for subsequent interventions, and thrombosis. All the patient's questions were answered and they are agreeable to proceed with surgery.     [place] [facility] [place] Beeper: [phone]

## 2013-10-07 ENCOUNTER — Encounter (HOSPITAL_COMMUNITY): Payer: Self-pay

## 2013-10-09 ENCOUNTER — Other Ambulatory Visit: Payer: Self-pay

## 2013-10-14 ENCOUNTER — Encounter (HOSPITAL_COMMUNITY): Payer: Self-pay | Admitting: Pharmacy Technician

## 2013-10-14 ENCOUNTER — Encounter (HOSPITAL_COMMUNITY)
Admission: RE | Admit: 2013-10-14 | Discharge: 2013-10-14 | Disposition: A | Discharge status: Home / Self Care | Payer: BC Managed Care – PPO | Source: Ambulatory Visit | Attending: Vascular Surgery | Admitting: Vascular Surgery

## 2013-10-14 ENCOUNTER — Other Ambulatory Visit (HOSPITAL_COMMUNITY): Payer: Self-pay | Admitting: *Deleted

## 2013-10-14 ENCOUNTER — Encounter (HOSPITAL_COMMUNITY): Payer: Self-pay

## 2013-10-14 DIAGNOSIS — Z01812 Encounter for preprocedural laboratory examination: Secondary | ICD-10-CM | POA: Insufficient documentation

## 2013-10-14 DIAGNOSIS — Z01818 Encounter for other preprocedural examination: Secondary | ICD-10-CM | POA: Insufficient documentation

## 2013-10-14 DIAGNOSIS — Z01810 Encounter for preprocedural cardiovascular examination: Secondary | ICD-10-CM | POA: Insufficient documentation

## 2013-10-14 HISTORY — DX: Adverse effect of unspecified anesthetic, initial encounter: T41.45XA

## 2013-10-14 HISTORY — DX: Headache: R51

## 2013-10-14 HISTORY — DX: Other complications of anesthesia, initial encounter: T88.59XA

## 2013-10-14 HISTORY — DX: Gastro-esophageal reflux disease without esophagitis: K21.9

## 2013-10-14 HISTORY — DX: Anemia, unspecified: D64.9

## 2013-10-14 HISTORY — DX: Pneumonia, unspecified organism: J18.9

## 2013-10-14 HISTORY — DX: Chronic kidney disease, unspecified: N18.9

## 2013-10-14 LAB — CBC
Hemoglobin: 10.6 g/dL — ABNORMAL LOW (ref 13.0–17.0)
MCH: 33.7 pg (ref 26.0–34.0)
MCHC: 36.4 g/dL — ABNORMAL HIGH (ref 30.0–36.0)
MCV: 92.4 fL (ref 78.0–100.0)
Platelets: 108 10*3/uL — ABNORMAL LOW (ref 150–400)
RDW: 13.8 % (ref 11.5–15.5)

## 2013-10-14 LAB — BASIC METABOLIC PANEL
Calcium: 11.7 mg/dL — ABNORMAL HIGH (ref 8.4–10.5)
Creatinine, Ser: 5.95 mg/dL — ABNORMAL HIGH (ref 0.50–1.35)
GFR calc Af Amer: 12 mL/min — ABNORMAL LOW (ref >90)

## 2013-10-14 NOTE — Pre-Procedure Instructions (Signed)
Steven Best  10/14/2013   Your procedure is scheduled on:  Friday, October 18, 2013 at 7:30 AM.   Report to [facility] Entrance "A" at 5:30 AM.   Call this number if you have problems the morning of surgery: [phone]   Remember:   Do not eat food or drink liquids after midnight Thursday, 10/17/13.   Take these medicines the morning of surgery with A SIP OF WATER: amLODipine (NORVASC), cyclobenzaprine (FLEXERIL), ALPRAZolam (XANAX), HYDROcodone-acetaminophen ([place]), eye drops if needed.  Stop Thiamine as of today 10/14/13.               Do not wear jewelry.  Do not wear lotions, powders, or cologne. You may wear deodorant.  Men may shave face and neck.  Do not bring valuables to the hospital.  [facility] is not responsible                  for any belongings or valuables.               Contacts, dentures or bridgework may not be worn into surgery.  Leave suitcase in the car. After surgery it may be brought to your room.  For patients admitted to the hospital, discharge time is determined by your                treatment team.               Patients discharged the day of surgery will not be allowed to drive  home.  Name and phone number of your driver: Family/friend   Special Instructions: Shower using CHG 2 nights before surgery and the night before surgery.  If you shower the day of surgery use CHG.  Use special wash - you have one bottle of CHG for all showers.  You should use approximately 1/3 of the bottle for each shower.   Please read over the following fact sheets that you were given: Pain Booklet, Coughing and Deep Breathing and Surgical Site Infection Prevention

## 2013-10-15 NOTE — Progress Notes (Signed)
Anesthesia Chart Review:  Patient is a 47 year old male scheduled for creation of LUE AVF on 10/18/13 by Dr. Scot Dock.  He is not yet on hemodialysis.  He actually plans to eventually start peritoneal dialysis, but needs a fistula for back-up.  Nephrologist is Dr. Moshe Cipro.  History noted.  He has difficulty with hypotension with a prior knee surgery.  Preoperative EKG, CXR, and labs noted.  He will get an ISTAT on the day of surgery.  If results are acceptable then anticipate that he can proceed as planned.  George Hugh Otto [facility] Stay Center/Anesthesiology Phone [phone] 10/15/2013 12:37 PM

## 2013-10-17 MED ORDER — CEFUROXIME SODIUM 1.5 G IJ SOLR
1.5000 g | INTRAMUSCULAR | Status: AC
  Administered 2013-10-18: 1.5 g via INTRAVENOUS
  Filled 2013-10-17: qty 1.5

## 2013-10-18 ENCOUNTER — Encounter (HOSPITAL_COMMUNITY): Payer: Self-pay | Admitting: Anesthesiology

## 2013-10-18 ENCOUNTER — Encounter (HOSPITAL_COMMUNITY): Payer: BC Managed Care – PPO | Admitting: Vascular Surgery

## 2013-10-18 ENCOUNTER — Ambulatory Visit (HOSPITAL_COMMUNITY): Payer: BC Managed Care – PPO | Admitting: Anesthesiology

## 2013-10-18 ENCOUNTER — Ambulatory Visit (HOSPITAL_COMMUNITY)
Admission: RE | Admit: 2013-10-18 | Discharge: 2013-10-18 | Disposition: A | Discharge status: Home / Self Care | Payer: BC Managed Care – PPO | Source: Ambulatory Visit | Attending: Vascular Surgery | Admitting: Vascular Surgery

## 2013-10-18 ENCOUNTER — Other Ambulatory Visit: Payer: Self-pay

## 2013-10-18 ENCOUNTER — Encounter (HOSPITAL_COMMUNITY)
Admission: RE | Disposition: A | Discharge status: Home / Self Care | Payer: Self-pay | Source: Ambulatory Visit | Attending: Vascular Surgery

## 2013-10-18 ENCOUNTER — Telehealth: Payer: Self-pay | Admitting: Vascular Surgery

## 2013-10-18 DIAGNOSIS — N186 End stage renal disease: Secondary | ICD-10-CM

## 2013-10-18 DIAGNOSIS — N184 Chronic kidney disease, stage 4 (severe): Secondary | ICD-10-CM | POA: Insufficient documentation

## 2013-10-18 DIAGNOSIS — Z87891 Personal history of nicotine dependence: Secondary | ICD-10-CM | POA: Insufficient documentation

## 2013-10-18 DIAGNOSIS — I129 Hypertensive chronic kidney disease with stage 1 through stage 4 chronic kidney disease, or unspecified chronic kidney disease: Secondary | ICD-10-CM | POA: Insufficient documentation

## 2013-10-18 DIAGNOSIS — J449 Chronic obstructive pulmonary disease, unspecified: Secondary | ICD-10-CM | POA: Insufficient documentation

## 2013-10-18 DIAGNOSIS — J4489 Other specified chronic obstructive pulmonary disease: Secondary | ICD-10-CM | POA: Insufficient documentation

## 2013-10-18 DIAGNOSIS — K219 Gastro-esophageal reflux disease without esophagitis: Secondary | ICD-10-CM | POA: Insufficient documentation

## 2013-10-18 HISTORY — PX: AV FISTULA PLACEMENT: SHX1204

## 2013-10-18 LAB — POCT I-STAT 4, (NA,K, GLUC, HGB,HCT)
HCT: 31 % — ABNORMAL LOW (ref 39.0–52.0)
Sodium: 136 mEq/L (ref 135–145)

## 2013-10-18 SURGERY — ARTERIOVENOUS (AV) FISTULA CREATION
Anesthesia: General | Site: Arm Lower | Laterality: Left | Wound class: Clean

## 2013-10-18 MED ORDER — OXYCODONE HCL 5 MG/5ML PO SOLN: 5.0000 mg | Freq: Once | ORAL | Status: AC | PRN

## 2013-10-18 MED ORDER — FENTANYL CITRATE 0.05 MG/ML IJ SOLN: 50.0000 ug | Freq: Once | INTRAMUSCULAR | Status: DC

## 2013-10-18 MED ORDER — FENTANYL CITRATE 0.05 MG/ML IJ SOLN: 25.0000 ug | INTRAMUSCULAR | Status: DC | PRN

## 2013-10-18 MED ORDER — LIDOCAINE HCL (PF) 1 % IJ SOLN
INTRAMUSCULAR | Status: AC
  Filled 2013-10-18: qty 30

## 2013-10-18 MED ORDER — THROMBIN 20000 UNITS EX SOLR
CUTANEOUS | Status: AC
  Filled 2013-10-18: qty 20000

## 2013-10-18 MED ORDER — LIDOCAINE HCL (PF) 1 % IJ SOLN
INTRAMUSCULAR | Status: DC | PRN
  Administered 2013-10-18: 30 mL

## 2013-10-18 MED ORDER — PROTAMINE SULFATE 10 MG/ML IV SOLN
INTRAVENOUS | Status: DC | PRN
  Administered 2013-10-18 (×3): 10 mg via INTRAVENOUS

## 2013-10-18 MED ORDER — FENTANYL CITRATE 0.05 MG/ML IJ SOLN
INTRAMUSCULAR | Status: DC | PRN
  Administered 2013-10-18 (×4): 50 ug via INTRAVENOUS

## 2013-10-18 MED ORDER — SODIUM CHLORIDE 0.9 % IV SOLN
INTRAVENOUS | Status: DC
  Administered 2013-10-18: 08:00:00 via INTRAVENOUS

## 2013-10-18 MED ORDER — 0.9 % SODIUM CHLORIDE (POUR BTL) OPTIME
TOPICAL | Status: DC | PRN
  Administered 2013-10-18: 1000 mL

## 2013-10-18 MED ORDER — PROMETHAZINE HCL 25 MG/ML IJ SOLN: 6.2500 mg | INTRAMUSCULAR | Status: DC | PRN

## 2013-10-18 MED ORDER — OXYCODONE HCL 5 MG PO TABS
5.0000 mg | ORAL_TABLET | Freq: Once | ORAL | Status: AC | PRN
  Administered 2013-10-18: 5 mg via ORAL

## 2013-10-18 MED ORDER — PAPAVERINE HCL 30 MG/ML IJ SOLN
INTRAMUSCULAR | Status: AC
  Filled 2013-10-18: qty 2

## 2013-10-18 MED ORDER — LIDOCAINE HCL (CARDIAC) 20 MG/ML IV SOLN
INTRAVENOUS | Status: DC | PRN
  Administered 2013-10-18: 60 mg via INTRAVENOUS

## 2013-10-18 MED ORDER — HEPARIN SODIUM (PORCINE) 1000 UNIT/ML IJ SOLN
INTRAMUSCULAR | Status: DC | PRN
  Administered 2013-10-18: 7000 [IU] via INTRAVENOUS

## 2013-10-18 MED ORDER — OXYCODONE HCL 5 MG PO TABS: 5.0000 mg | ORAL_TABLET | Freq: Four times a day (QID) | ORAL | Status: DC | PRN

## 2013-10-18 MED ORDER — MIDAZOLAM HCL 2 MG/2ML IJ SOLN: 1.0000 mg | INTRAMUSCULAR | Status: DC | PRN

## 2013-10-18 MED ORDER — SODIUM CHLORIDE 0.9 % IR SOLN
Status: DC | PRN
  Administered 2013-10-18: 08:00:00

## 2013-10-18 MED ORDER — ONDANSETRON HCL 4 MG/2ML IJ SOLN
INTRAMUSCULAR | Status: DC | PRN
  Administered 2013-10-18: 4 mg via INTRAVENOUS

## 2013-10-18 MED ORDER — OXYCODONE HCL 5 MG PO TABS
ORAL_TABLET | ORAL | Status: AC
  Filled 2013-10-18: qty 1

## 2013-10-18 MED ORDER — PROPOFOL 10 MG/ML IV BOLUS
INTRAVENOUS | Status: DC | PRN
  Administered 2013-10-18: 200 mg via INTRAVENOUS
  Administered 2013-10-18: 100 mg via INTRAVENOUS

## 2013-10-18 SURGICAL SUPPLY — 38 items
ARMBAND PINK RESTRICT EXTREMIT (MISCELLANEOUS) ×2 IMPLANT
CANISTER SUCTION 2500CC (MISCELLANEOUS) ×2 IMPLANT
CLIP TI MEDIUM 6 (CLIP) ×2 IMPLANT
CLIP TI WIDE RED SMALL 6 (CLIP) ×2 IMPLANT
COVER PROBE W GEL 5X96 (DRAPES) ×2 IMPLANT
COVER SURGICAL LIGHT HANDLE (MISCELLANEOUS) ×2 IMPLANT
DECANTER SPIKE VIAL GLASS SM (MISCELLANEOUS) IMPLANT
DERMABOND ADVANCED (GAUZE/BANDAGES/DRESSINGS) ×1
DERMABOND ADVANCED .7 DNX12 (GAUZE/BANDAGES/DRESSINGS) ×1 IMPLANT
DRAIN PENROSE 1/2X12 LTX STRL (WOUND CARE) IMPLANT
ELECT REM PT RETURN 9FT ADLT (ELECTROSURGICAL) ×2
ELECTRODE REM PT RTRN 9FT ADLT (ELECTROSURGICAL) ×1 IMPLANT
GLOVE BIO SURGEON STRL SZ 6.5 (GLOVE) ×4 IMPLANT
GLOVE BIO SURGEON STRL SZ7.5 (GLOVE) ×2 IMPLANT
GLOVE BIOGEL PI IND STRL 6.5 (GLOVE) ×2 IMPLANT
GLOVE BIOGEL PI IND STRL 8 (GLOVE) ×1 IMPLANT
GLOVE BIOGEL PI INDICATOR 6.5 (GLOVE) ×2
GLOVE BIOGEL PI INDICATOR 8 (GLOVE) ×1
GLOVE ECLIPSE 7.5 STRL STRAW (GLOVE) ×4 IMPLANT
GLOVE SURG SS PI 6.5 STRL IVOR (GLOVE) ×2 IMPLANT
GOWN STRL NON-REIN LRG LVL3 (GOWN DISPOSABLE) ×6 IMPLANT
GOWN STRL REIN XL XLG (GOWN DISPOSABLE) ×4 IMPLANT
KIT BASIN OR (CUSTOM PROCEDURE TRAY) ×2 IMPLANT
KIT ROOM TURNOVER OR (KITS) ×2 IMPLANT
NEEDLE HYPO 25GX1X1/2 BEV (NEEDLE) IMPLANT
NS IRRIG 1000ML POUR BTL (IV SOLUTION) ×2 IMPLANT
PACK CV ACCESS (CUSTOM PROCEDURE TRAY) ×2 IMPLANT
PAD ARMBOARD 7.5X6 YLW CONV (MISCELLANEOUS) ×4 IMPLANT
SPONGE GAUZE 4X4 12PLY (GAUZE/BANDAGES/DRESSINGS) ×2 IMPLANT
SPONGE SURGIFOAM ABS GEL 100 (HEMOSTASIS) IMPLANT
SUT PROLENE 6 0 BV (SUTURE) ×2 IMPLANT
SUT VIC AB 3-0 SH 27 (SUTURE) ×1
SUT VIC AB 3-0 SH 27X BRD (SUTURE) ×1 IMPLANT
SUT VICRYL 4-0 PS2 18IN ABS (SUTURE) ×2 IMPLANT
TOWEL OR 17X24 6PK STRL BLUE (TOWEL DISPOSABLE) ×2 IMPLANT
TOWEL OR 17X26 10 PK STRL BLUE (TOWEL DISPOSABLE) ×2 IMPLANT
UNDERPAD 30X30 INCONTINENT (UNDERPADS AND DIAPERS) ×2 IMPLANT
WATER STERILE IRR 1000ML POUR (IV SOLUTION) ×2 IMPLANT

## 2013-10-18 NOTE — Interval H&P Note (Signed)
History and Physical Interval Note:  10/18/2013 7:53 AM  Steven Best  has presented today for surgery, with the diagnosis of End Stage Renal Disease   The various methods of treatment have been discussed with the patient and family. After consideration of risks, benefits and other options for treatment, the patient has consented to  Procedure(s): ARTERIOVENOUS (AV) FISTULA CREATION-LEFT ARM (Left) as a surgical intervention .  The patient's history has been reviewed, patient examined, no change in status, stable for surgery.  I have reviewed the patient's chart and labs.  Questions were answered to the patient's satisfaction.     [name] S

## 2013-10-18 NOTE — Transfer of Care (Signed)
Immediate Anesthesia Transfer of Care Note  Patient: Steven Best  Procedure(s) Performed: Procedure(s): ARTERIOVENOUS (AV) FISTULA CREATION; ULTRASOUND GUIDED (Left)  Patient Location: PACU  Anesthesia Type:General  Level of Consciousness: awake, alert , oriented and patient cooperative  Airway & Oxygen Therapy: Patient Spontanous Breathing and Patient connected to nasal cannula oxygen  Post-op Assessment: Report given to PACU RN and Post -op Vital signs reviewed and stable  Post vital signs: Reviewed  Complications: No apparent anesthesia complications

## 2013-10-18 NOTE — H&P (View-Only) (Signed)
[facility] of [place]  Patient name: Steven Best MRN: [id number] DOB: 1966/10/09 Sex: male  REASON FOR CONSULT: Evaluate for hemodialysis access. Referred by Dr. Vanetta Mulders.  HPI: Steven Best is a 47 y.o. male who is not yet on dialysis. He states that he had problems with hypertension and was started on lisinopril. He later developed renal insufficiency. He plans on doing peritoneal dialysis but we were asked to place a fistula for a backup. He is right-handed. He denies any recent uremic symptoms. Specifically, he denies nausea, vomiting, fatigue, anorexia, or palpitations.  I have reviewed his records from [facility]. He is felt to be a good candidate for peritoneal dialysis. The fistula is for a backup. He is also apparently going to be scheduled for evaluation for transplant. His blood pressure has been under good control.    Past Medical History  Diagnosis Date  . Anxiety   . HTN (hypertension)    Family History  Problem Relation Age of Onset  . Diabetes Mother   . Heart disease Mother     before age 52  . Hyperlipidemia Mother   . Hypertension Mother   . Diabetes Father   . Heart disease Father     before age 14  . Hyperlipidemia Father   . Hypertension Father   . Cancer Sister   . Hyperlipidemia Sister   . Hypertension Sister    SOCIAL HISTORY: History  Substance Use Topics  . Smoking status: Former Smoker -- 2.00 packs/day for 16 years    Types: Cigarettes    Quit date: 09/20/1996  . Smokeless tobacco: Never Used  . Alcohol Use: 4.8 oz/week    4 Cans of beer, 4 Shots of liquor, 0 Glasses of wine per week   No Known Allergies Current Outpatient Prescriptions  Medication Sig Dispense Refill  . ALPRAZolam (XANAX) 1 MG tablet Take 1 mg by mouth 2 (two) times daily as needed. For anxiety      . amLODipine (NORVASC) 10 MG tablet Take 10 mg by mouth daily. Take 1/2 tablet daily      . cyclobenzaprine (FLEXERIL)  10 MG tablet Take 10 mg by mouth 3 (three) times daily as needed. For muscle spasms.      . folic acid (FOLVITE) 1 MG tablet Take 1 tablet (1 mg total) by mouth daily.  30 tablet  0  . HYDROcodone-acetaminophen (NORCO) 10-325 MG per tablet Take 2 tablets by mouth 2 (two) times daily.      Marland Kitchen OVER THE COUNTER MEDICATION Place 2-3 drops into both eyes as needed. Liquid tears eye drops  For itchy/red eyes      . thiamine 100 MG tablet Take 100 mg by mouth daily. Vitamin B-1       No current facility-administered medications for this visit.   REVIEW OF SYSTEMS: Valu.Nieves ] denotes positive finding; [  ] denotes negative finding  CARDIOVASCULAR:  [ ]  chest pain   [ ]  chest pressure   [ ]  palpitations   [ ]  orthopnea   [ ]  dyspnea on exertion   [ ]  claudication   [ ]  rest pain   [ ]  DVT   [ ]  phlebitis PULMONARY:   [ ]  productive cough   [ ]  asthma   [ ]  wheezing NEUROLOGIC:   [ ]  weakness  [ ]  paresthesias  [ ]  aphasia  [ ]  amaurosis  [ ]  dizziness HEMATOLOGIC:   [ ]  bleeding problems   [ ]   clotting disorders MUSCULOSKELETAL:  [ ]  joint pain   [ ]  joint swelling [ ]  leg swelling GASTROINTESTINAL: [ ]   blood in stool  [ ]   hematemesis GENITOURINARY:  [ ]   dysuria  [ ]   hematuria PSYCHIATRIC:  [ ]  history of major depression INTEGUMENTARY:  [ ]  rashes  [ ]  ulcers CONSTITUTIONAL:  [ ]  fever   [ ]  chills  PHYSICAL EXAM: Filed Vitals:   10/02/13 1458  BP: 123/80  Pulse: 88  Height: 6\' 4"  (1.93 m)  Weight: 269 lb (122.018 kg)  SpO2: 100%   Body mass index is 32.76 kg/(m^2). GENERAL: The patient is a well-nourished male, in no acute distress. The vital signs are documented above. CARDIOVASCULAR: There is a regular rate and rhythm. Do not detect carotid bruits. He has palpable brachial and radial pulses bilaterally. PULMONARY: There is good air exchange bilaterally without wheezing or rales. ABDOMEN: Soft and non-tender with normal pitched bowel sounds.  MUSCULOSKELETAL: There are no major  deformities or cyanosis. NEUROLOGIC: No focal weakness or paresthesias are detected. SKIN: There are no ulcers or rashes noted. PSYCHIATRIC: The patient has a normal affect.  DATA:  I have independently interpreted his arterial Doppler study today. He has triphasic Doppler signals in the radial and ulnar positions bilaterally. Brachial artery diameter on the right is 0.57 cm. Brachial artery diameter on the left is 0.61 cm.  I have independently interpreted his vein mapping. His forearm and upper arm cephalic vein in both arms appeared be reasonable for a fistula. Likewise both basilic veins appear to be reasonable for a basilic vein transposition.  MEDICAL ISSUES:  End stage renal disease This patient appears to be a good candidate for a left upper extremity AV fistula. This has been scheduled for 10/18/2013. I have explained the indications for placement of an AV fistula. I've explained that if at all possible we will place an AV fistula.  I have reviewed the risks of placement of an AV fistula including but not limited to: failure of the fistula to mature, need for subsequent interventions, and thrombosis. All the patient's questions were answered and they are agreeable to proceed with surgery.     [place] [facility] [place] Beeper: [phone]

## 2013-10-18 NOTE — Anesthesia Postprocedure Evaluation (Signed)
  Anesthesia Post-op Note  Patient: Steven Best  Procedure(s) Performed: Procedure(s): ARTERIOVENOUS (AV) FISTULA CREATION; ULTRASOUND GUIDED (Left)  Patient Location: PACU  Anesthesia Type:General  Level of Consciousness: awake  Airway and Oxygen Therapy: Patient Spontanous Breathing  Post-op Pain: mild  Post-op Assessment: Post-op Vital signs reviewed, Patient's Cardiovascular Status Stable, Respiratory Function Stable, Patent Airway, No signs of Nausea or vomiting and Pain level controlled  Post-op Vital Signs: Reviewed and stable  Complications: No apparent anesthesia complications

## 2013-10-18 NOTE — Op Note (Signed)
   NAME: JUANANDRES PAYANT    MRN: [id number] DOB: November 23, 1966    DATE OF OPERATION: 10/18/2013  PREOP DIAGNOSIS: stage IV chronic kidney disease  POSTOP DIAGNOSIS: same  PROCEDURE: left radial cephalic AV fistula  SURGEON: Judeth Cornfield. Scot Dock, MD, FACS  ASSIST: Leontine Locket, PA  ANESTHESIA: Gen.   EBL: minimal  INDICATIONS: CHIDUBEM PAE is a 47 y.o. male who is not yet on dialysis. He presents for new access.  FINDINGS: the forearm cephalic vein was approximately 4 mm. The radial artery was approximately 2-1/2 mm.  TECHNIQUE: The patient was taken to the operating room and received a general anesthetic. The left upper extremity was prepped and draped in usual sterile fashion. An oblique incision was made at the left wrist. Through this incision the cephalic vein was dissected free and ligated. Was a fairly small vein about 4 mm. Radial artery was dissected free beneath the fascia. The patient was heparinized. I was able to spatulate a small branch to allow for a more spatulated anastomosis. The radial artery was clamped proximally and distally and a longitudinal arteriotomy was made. The vein was sewn end-to-side to the artery using continuous 6-0 Prolene suture. There was a palpable thrill in the fistula at the completion. The heparin was partially reversed with protamine. The wound was closed the deep layer 3-0 Vicryl and the skin closed with 4-0 Vicryl. Dermabond was applied. The patient tolerated the procedure well and was transferred to the recovery room in stable condition. All needle and sponge counts were correct.  Deitra Mayo, MD, FACS [facility]  DATE OF DICTATION:   10/18/2013

## 2013-10-18 NOTE — Preoperative (Signed)
Beta Blockers   Reason not to administer Beta Blockers:Not Applicable 

## 2013-10-18 NOTE — Telephone Encounter (Addendum)
Message copied by Gena Fray on Fri Oct 18, 2013  3:21 PM ------      Message from: Denman George      Created: Fri Oct 18, 2013  1:59 PM      Regarding: FW: charge and f/u       Please sched. 6 wk f/u with CSD            ----- Message -----         From: Angelia Mould, MD         Sent: 10/18/2013   9:35 AM           To: Patrici Ranks, Alfonso Patten, RN, #      Subject: charge and f/u                                           PROCEDURE: left radial cephalic AV fistula            SURGEON: Judeth Cornfield. Scot Dock, MD, FACS            ASSIST: Leontine Locket, PA            He needs a follow up visit in 6 weeks with a duplex of this fistula to check on the maturation of this fistula.      Thanks      CD       ------  10/18/13: lm for patient, dpm

## 2013-10-18 NOTE — Anesthesia Procedure Notes (Signed)
Procedure Name: LMA Insertion Date/Time: 10/18/2013 8:16 AM Performed by: Luciana Axe K Pre-anesthesia Checklist: Patient identified, Emergency Drugs available, Suction available, Patient being monitored and Timeout performed Patient Re-evaluated:Patient Re-evaluated prior to inductionOxygen Delivery Method: Circle system utilized Preoxygenation: Pre-oxygenation with 100% oxygen Intubation Type: IV induction Ventilation: Mask ventilation without difficulty and Oral airway inserted - appropriate to patient size LMA: LMA inserted LMA Size: 5.0 Number of attempts: 1 Placement Confirmation: positive ETCO2,  CO2 detector and breath sounds checked- equal and bilateral Tube secured with: Tape Dental Injury: Teeth and Oropharynx as per pre-operative assessment

## 2013-10-18 NOTE — Anesthesia Preprocedure Evaluation (Addendum)
Anesthesia Evaluation  Patient identified by MRN, date of birth, ID band Patient awake    Reviewed: Allergy & Precautions, H&P , NPO status , Patient's Chart, lab work & pertinent test results  History of Anesthesia Complications (+) Family history of anesthesia reaction and history of anesthetic complications  Airway Mallampati: II TM Distance: >3 FB Neck ROM: Full    Dental  (+) Chipped and Dental Advisory Given   Pulmonary COPDformer smoker,  breath sounds clear to auscultation- rhonchi  Pulmonary exam normal       Cardiovascular hypertension, Pt. on medications Rhythm:Regular Rate:Normal     Neuro/Psych  Headaches, Anxiety    GI/Hepatic Neg liver ROS, GERD-  Controlled,  Endo/Other  negative endocrine ROS  Renal/GU ESRFRenal disease (K+ 3.8, no dialysis yet)     Musculoskeletal   Abdominal (+) + obese,   Peds  Hematology  (+) anemia ,   Anesthesia Other Findings plt 108  Reproductive/Obstetrics                        Anesthesia Physical Anesthesia Plan  ASA: III  Anesthesia Plan: General   Post-op Pain Management:    Induction: Intravenous  Airway Management Planned: LMA  Additional Equipment:   Intra-op Plan:   Post-operative Plan: Extubation in OR  Informed Consent: I have reviewed the patients History and Physical, chart, labs and discussed the procedure including the risks, benefits and alternatives for the proposed anesthesia with the patient or authorized representative who has indicated his/her understanding and acceptance.   Dental advisory given  Plan Discussed with: CRNA and Surgeon  Anesthesia Plan Comments: (Plan routine monitors, GA- LMA OK)       Anesthesia Quick Evaluation

## 2013-10-22 ENCOUNTER — Encounter (HOSPITAL_COMMUNITY): Payer: Self-pay | Admitting: Vascular Surgery

## 2013-10-22 NOTE — Addendum Note (Signed)
Addendum created 10/22/13 1526 by Leda Quail, MD   Modules edited: Anesthesia Attestations

## 2013-11-05 ENCOUNTER — Other Ambulatory Visit: Payer: Self-pay | Admitting: *Deleted

## 2013-11-05 DIAGNOSIS — N186 End stage renal disease: Secondary | ICD-10-CM

## 2013-11-05 DIAGNOSIS — Z4931 Encounter for adequacy testing for hemodialysis: Secondary | ICD-10-CM

## 2013-12-04 ENCOUNTER — Encounter: Payer: BC Managed Care – PPO | Admitting: Vascular Surgery

## 2013-12-04 ENCOUNTER — Other Ambulatory Visit (HOSPITAL_COMMUNITY): Payer: BC Managed Care – PPO

## 2013-12-10 ENCOUNTER — Encounter: Payer: Self-pay | Admitting: Vascular Surgery

## 2013-12-11 ENCOUNTER — Encounter: Payer: BC Managed Care – PPO | Admitting: Vascular Surgery

## 2013-12-11 ENCOUNTER — Other Ambulatory Visit (HOSPITAL_COMMUNITY): Payer: BC Managed Care – PPO

## 2013-12-24 ENCOUNTER — Encounter: Payer: Self-pay | Admitting: Vascular Surgery

## 2013-12-25 ENCOUNTER — Encounter: Payer: Self-pay | Admitting: Vascular Surgery

## 2013-12-25 ENCOUNTER — Ambulatory Visit (INDEPENDENT_AMBULATORY_CARE_PROVIDER_SITE_OTHER): Payer: BC Managed Care – PPO | Admitting: Vascular Surgery

## 2013-12-25 ENCOUNTER — Encounter (INDEPENDENT_AMBULATORY_CARE_PROVIDER_SITE_OTHER): Payer: Self-pay

## 2013-12-25 ENCOUNTER — Ambulatory Visit (HOSPITAL_COMMUNITY)
Admission: RE | Admit: 2013-12-25 | Discharge: 2013-12-25 | Disposition: A | Discharge status: Home / Self Care | Payer: BC Managed Care – PPO | Source: Ambulatory Visit | Attending: Vascular Surgery | Admitting: Vascular Surgery

## 2013-12-25 VITALS — BP 131/85 | HR 100 | Ht 76.0 in | Wt 275.0 lb

## 2013-12-25 DIAGNOSIS — N186 End stage renal disease: Secondary | ICD-10-CM | POA: Insufficient documentation

## 2013-12-25 DIAGNOSIS — Z992 Dependence on renal dialysis: Principal | ICD-10-CM

## 2013-12-25 DIAGNOSIS — Z4931 Encounter for adequacy testing for hemodialysis: Secondary | ICD-10-CM | POA: Insufficient documentation

## 2013-12-25 NOTE — Assessment & Plan Note (Signed)
This fistula appears to be maturing nicely. This should be adequate for dialysis if it were needed in approximately 6-8 weeks. I will see him back as needed.

## 2013-12-25 NOTE — Progress Notes (Signed)
   Patient name: Steven Best MRN: [id number] DOB: 01-24-1966 Sex: male  REASON FOR VISIT: Follow up after left radiocephalic AV fistula  HPI: Steven Best is a 48 y.o. male who had a left radiocephalic AV fistula placed on 10/18/2013. He comes in for a 6 week follow up visit. He is not yet on dialysis. He denies any pain or paresthesias in his left upper extremity.  REVIEW OF SYSTEMS: Valu.Nieves ] denotes positive finding; [  ] denotes negative finding  CARDIOVASCULAR:  [ ]  chest pain   [ ]  dyspnea on exertion    CONSTITUTIONAL:  [ ]  fever   [ ]  chills  PHYSICAL EXAM: Filed Vitals:   12/25/13 1011  BP: 131/85  Pulse: 100  Height: 6\' 4"  (1.93 m)  Weight: 275 lb (124.739 kg)  SpO2: 100%   Body mass index is 33.49 kg/(m^2). GENERAL: The patient is a well-nourished male, in no acute distress. The vital signs are documented above. CARDIOVASCULAR: There is a regular rate and rhythm. PULMONARY: There is good air exchange bilaterally without wheezing or rales. His left radiocephalic fistula has an excellent thrill and bruit. His incision is healed nicely.  DUPLEX: Duplex of his fistula shows that the fistula was maturing nicely. There were no areas of significantly increased callosity. Diameters range from 0.61 cm-0.83 cm. Depths range from 0.32 cm-0.54 cm.  MEDICAL ISSUES:  End stage renal disease This fistula appears to be maturing nicely. This should be adequate for dialysis if it were needed in approximately 6-8 weeks. I will see him back as needed.   [place] [facility] [place] Beeper: [phone]

## 2014-01-09 ENCOUNTER — Other Ambulatory Visit (HOSPITAL_COMMUNITY): Payer: Self-pay | Admitting: Gastroenterology

## 2014-01-09 DIAGNOSIS — K7460 Unspecified cirrhosis of liver: Secondary | ICD-10-CM

## 2014-01-15 ENCOUNTER — Ambulatory Visit (HOSPITAL_COMMUNITY): Payer: BC Managed Care – PPO

## 2014-01-24 ENCOUNTER — Ambulatory Visit (HOSPITAL_COMMUNITY)
Admission: RE | Admit: 2014-01-24 | Discharge: 2014-01-24 | Disposition: A | Discharge status: Home / Self Care | Payer: BC Managed Care – PPO | Source: Ambulatory Visit | Attending: Gastroenterology | Admitting: Gastroenterology

## 2014-01-24 DIAGNOSIS — I129 Hypertensive chronic kidney disease with stage 1 through stage 4 chronic kidney disease, or unspecified chronic kidney disease: Secondary | ICD-10-CM | POA: Insufficient documentation

## 2014-01-24 DIAGNOSIS — K7460 Unspecified cirrhosis of liver: Secondary | ICD-10-CM

## 2014-01-24 DIAGNOSIS — N189 Chronic kidney disease, unspecified: Secondary | ICD-10-CM | POA: Insufficient documentation

## 2014-01-26 HISTORY — PX: ESOPHAGOGASTRODUODENOSCOPY: SHX1529

## 2014-01-30 ENCOUNTER — Encounter (HOSPITAL_COMMUNITY): Payer: Self-pay | Admitting: Pharmacist

## 2014-01-30 ENCOUNTER — Encounter (HOSPITAL_COMMUNITY): Payer: Self-pay | Admitting: *Deleted

## 2014-01-30 DIAGNOSIS — I770 Arteriovenous fistula, acquired: Secondary | ICD-10-CM

## 2014-01-30 DIAGNOSIS — J189 Pneumonia, unspecified organism: Secondary | ICD-10-CM

## 2014-01-30 HISTORY — DX: Pneumonia, unspecified organism: J18.9

## 2014-01-30 HISTORY — DX: Arteriovenous fistula, acquired: I77.0

## 2014-02-07 ENCOUNTER — Other Ambulatory Visit: Payer: Self-pay | Admitting: Gastroenterology

## 2014-02-07 NOTE — Addendum Note (Signed)
Addended by: Arta Silence on: 02/07/2014 05:08 PM   Modules accepted: Orders

## 2014-02-12 ENCOUNTER — Encounter (HOSPITAL_COMMUNITY): Payer: BC Managed Care – PPO | Admitting: Anesthesiology

## 2014-02-12 ENCOUNTER — Ambulatory Visit (HOSPITAL_COMMUNITY): Payer: BC Managed Care – PPO | Admitting: Anesthesiology

## 2014-02-12 ENCOUNTER — Encounter (HOSPITAL_COMMUNITY)
Admission: RE | Disposition: A | Discharge status: Home / Self Care | Payer: Self-pay | Source: Ambulatory Visit | Attending: Gastroenterology

## 2014-02-12 ENCOUNTER — Ambulatory Visit (HOSPITAL_COMMUNITY)
Admission: RE | Admit: 2014-02-12 | Discharge: 2014-02-12 | Disposition: A | Discharge status: Home / Self Care | Payer: BC Managed Care – PPO | Source: Ambulatory Visit | Attending: Gastroenterology | Admitting: Gastroenterology

## 2014-02-12 ENCOUNTER — Encounter (HOSPITAL_COMMUNITY): Payer: Self-pay | Admitting: *Deleted

## 2014-02-12 DIAGNOSIS — Z87891 Personal history of nicotine dependence: Secondary | ICD-10-CM | POA: Insufficient documentation

## 2014-02-12 DIAGNOSIS — I120 Hypertensive chronic kidney disease with stage 5 chronic kidney disease or end stage renal disease: Secondary | ICD-10-CM | POA: Insufficient documentation

## 2014-02-12 DIAGNOSIS — K319 Disease of stomach and duodenum, unspecified: Secondary | ICD-10-CM | POA: Insufficient documentation

## 2014-02-12 DIAGNOSIS — K766 Portal hypertension: Secondary | ICD-10-CM | POA: Insufficient documentation

## 2014-02-12 DIAGNOSIS — N186 End stage renal disease: Secondary | ICD-10-CM | POA: Insufficient documentation

## 2014-02-12 DIAGNOSIS — K7460 Unspecified cirrhosis of liver: Secondary | ICD-10-CM | POA: Insufficient documentation

## 2014-02-12 HISTORY — PX: GASTRIC VARICES BANDING: SHX5519

## 2014-02-12 HISTORY — DX: Arteriovenous fistula, acquired: I77.0

## 2014-02-12 HISTORY — PX: ESOPHAGOGASTRODUODENOSCOPY (EGD) WITH PROPOFOL: SHX5813

## 2014-02-12 SURGERY — ESOPHAGOGASTRODUODENOSCOPY (EGD) WITH PROPOFOL
Anesthesia: Monitor Anesthesia Care

## 2014-02-12 MED ORDER — PROPOFOL 10 MG/ML IV BOLUS
INTRAVENOUS | Status: AC
  Filled 2014-02-12: qty 20

## 2014-02-12 MED ORDER — FENTANYL CITRATE 0.05 MG/ML IJ SOLN
INTRAMUSCULAR | Status: DC | PRN
  Administered 2014-02-12 (×2): 50 ug via INTRAVENOUS

## 2014-02-12 MED ORDER — MIDAZOLAM HCL 2 MG/2ML IJ SOLN
INTRAMUSCULAR | Status: AC
  Filled 2014-02-12: qty 2

## 2014-02-12 MED ORDER — FENTANYL CITRATE 0.05 MG/ML IJ SOLN
INTRAMUSCULAR | Status: AC
  Filled 2014-02-12: qty 2

## 2014-02-12 MED ORDER — GLYCOPYRROLATE 0.2 MG/ML IJ SOLN
INTRAMUSCULAR | Status: DC | PRN
  Administered 2014-02-12: 0.2 mg via INTRAVENOUS

## 2014-02-12 MED ORDER — PROPOFOL INFUSION 10 MG/ML OPTIME
INTRAVENOUS | Status: DC | PRN
  Administered 2014-02-12: 140 ug/kg/min via INTRAVENOUS

## 2014-02-12 MED ORDER — SODIUM CHLORIDE 0.9 % IV SOLN
INTRAVENOUS | Status: DC
  Administered 2014-02-12: 1000 mL via INTRAVENOUS

## 2014-02-12 MED ORDER — LIDOCAINE HCL (CARDIAC) 20 MG/ML IV SOLN
INTRAVENOUS | Status: AC
  Filled 2014-02-12: qty 5

## 2014-02-12 MED ORDER — LACTATED RINGERS IV SOLN: INTRAVENOUS | Status: DC | PRN

## 2014-02-12 MED ORDER — BUTAMBEN-TETRACAINE-BENZOCAINE 2-2-14 % EX AERO
INHALATION_SPRAY | CUTANEOUS | Status: DC | PRN
  Administered 2014-02-12: 2 via TOPICAL

## 2014-02-12 MED ORDER — MIDAZOLAM HCL 5 MG/5ML IJ SOLN
INTRAMUSCULAR | Status: DC | PRN
  Administered 2014-02-12 (×2): 1 mg via INTRAVENOUS

## 2014-02-12 MED ORDER — LIDOCAINE HCL (CARDIAC) 20 MG/ML IV SOLN
INTRAVENOUS | Status: DC | PRN
  Administered 2014-02-12: 100 mg via INTRAVENOUS

## 2014-02-12 SURGICAL SUPPLY — 14 items

## 2014-02-12 NOTE — Op Note (Signed)
[facility] [place], [address]   ENDOSCOPY PROCEDURE REPORT  PATIENT: Steven Best, Steven Best  MR#: [id number] BIRTHDATE: Mar 22, 1966 , 47  yrs. old GENDER: Male ENDOSCOPIST: Arta Silence, MD REFERRED BY:  Azalia Bilis, M.D. PROCEDURE DATE:  02/12/2014 PROCEDURE:  EGD, diagnostic ASA CLASS:     Class IV INDICATIONS:  cirrhosis, variceal screening. MEDICATIONS: MAC sedation, administered by CRNA TOPICAL ANESTHETIC: Cetacaine Spray  DESCRIPTION OF PROCEDURE: After the risks benefits and alternatives of the procedure were thoroughly explained, informed consent was obtained.  The [phone] endoscope was introduced through the mouth and advanced to the second portion of the duodenum. Without limitations.  The instrument was slowly withdrawn as the mucosa was fully examined.     Findings:  Irregular Z-line, otherwise normal esophagus; specifically, no evidence of esophageal varices.  Lots of retained food in stomach, obscuring several views of the gastric wall.  As best I could tell, there was mild portal hypertensive gastropathy. Retroflexion into the cardia showed no evidence of gastric varices. Widely patent pylorus without evidence of gastric outlet obstruction.  Normal duodenum to the second portion.          The scope was then withdrawn from the patient and the procedure completed.  ENDOSCOPIC IMPRESSION:     As above.  No gastric or esophageal varices.  Mild portal gastropathy.  Retained gastric contents raises possibility of gastroparesis.  RECOMMENDATIONS:     1.  Watch for potential complications of procedure. 2.  Repeat EGD for variceal screening in 2 years. 3.  I have asked patient to give me number of his kidney transplant surgeon, so I can discuss the case with them re: his kidney transplant candidacy. 4.  Follow-up with [facility] in 6 months.  eSigned:  Arta Silence, MD 02/12/2014 10:31 AM   CC:

## 2014-02-12 NOTE — Transfer of Care (Signed)
Immediate Anesthesia Transfer of Care Note  Patient: Steven Best  Procedure(s) Performed: Procedure(s) with comments: ESOPHAGOGASTRODUODENOSCOPY (EGD) WITH PROPOFOL (N/A) GASTRIC VARICES BANDING (N/A) - possible banding  Patient Location: PACU  Anesthesia Type:MAC  Level of Consciousness: awake  Airway & Oxygen Therapy: Patient Spontanous Breathing  Post-op Assessment: Report given to PACU RN and Post -op Vital signs reviewed and stable  Post vital signs: Reviewed and stable  Complications: No apparent anesthesia complications

## 2014-02-12 NOTE — Discharge Instructions (Signed)
Endoscopy °Care After °Please read the instructions outlined below and refer to this sheet in the next few weeks. These discharge instructions provide you with general information on caring for yourself after you leave the hospital. Your doctor may also give you specific instructions. While your treatment has been planned according to the most current medical practices available, unavoidable complications occasionally occur. If you have any problems or questions after discharge, please call Dr. Mykel Sponaugle ([facility]) at [phone]. ° °HOME CARE INSTRUCTIONS °Activity °· You may resume your regular activity but move at a slower pace for the next 24 hours.  °· Take frequent rest periods for the next 24 hours.  °· Walking will help expel (get rid of) the air and reduce the bloated feeling in your abdomen.  °· No driving for 24 hours (because of the anesthesia (medicine) used during the test).  °· You may shower.  °· Do not sign any important legal documents or operate any machinery for 24 hours (because of the anesthesia used during the test).  °Nutrition °· Drink plenty of fluids.  °· You may resume your normal diet.  °· Begin with a light meal and progress to your normal diet.  °· Avoid alcoholic beverages for 24 hours or as instructed by your caregiver.  °Medications °You may resume your normal medications unless your caregiver tells you otherwise. °What you can expect today °· You may experience abdominal discomfort such as a feeling of fullness or "gas" pains.  °· You may experience a sore throat for 2 to 3 days. This is normal. Gargling with salt water may help this.  °·  °SEEK IMMEDIATE MEDICAL CARE IF: °· You have excessive nausea (feeling sick to your stomach) and/or vomiting.  °· You have severe abdominal pain and distention (swelling).  °· You have trouble swallowing.  °· You have a temperature over 100° F (37.8° C).  °· You have rectal bleeding or vomiting of blood.  °Document Released:  06/28/2004 Document Revised: 07/27/2011 Document Reviewed: 01/09/2008 °ExitCare® Patient Information ©2012 [employer]. °

## 2014-02-12 NOTE — H&P (Signed)
Patient interval history reviewed.  Patient examined again.  There has been no change from documented H/P dated 01/29/14 (scanned into chart from our office) except as documented above.  Assessment:  1.  Cirrhosis.  Plan:  1. Endoscopy to screen for varices. 2.  Risks (bleeding, infection, bowel perforation that could require surgery, sedation-related changes in cardiopulmonary systems), benefits (identification and possible treatment of source of symptoms, exclusion of certain causes of symptoms), and alternatives (watchful waiting, radiographic imaging studies, empiric medical treatment) of upper endoscopy (EGD) were explained to patient/family in detail and patient wishes to proceed.

## 2014-02-12 NOTE — Preoperative (Signed)
Beta Blockers   Reason not to administer Beta Blockers:Not Applicable 

## 2014-02-12 NOTE — Anesthesia Preprocedure Evaluation (Signed)
Anesthesia Evaluation  Patient identified by MRN, date of birth, ID band Patient awake    Reviewed: Allergy & Precautions, H&P , NPO status , Patient's Chart, lab work & pertinent test results  History of Anesthesia Complications Negative for: history of anesthetic complications  Airway Mallampati: II TM Distance: >3 FB Neck ROM: Full    Dental no notable dental hx.    Pulmonary neg pulmonary ROS, former smoker,  breath sounds clear to auscultation  Pulmonary exam normal       Cardiovascular hypertension, Pt. on medications Rhythm:Regular Rate:Normal     Neuro/Psych negative neurological ROS  negative psych ROS   GI/Hepatic negative GI ROS, (+) Cirrhosis -       ,   Endo/Other  negative endocrine ROS  Renal/GU CRF and ESRFRenal disease  negative genitourinary   Musculoskeletal negative musculoskeletal ROS (+)   Abdominal   Peds negative pediatric ROS (+)  Hematology negative hematology ROS (+)   Anesthesia Other Findings   Reproductive/Obstetrics negative OB ROS                           Anesthesia Physical Anesthesia Plan  ASA: IV  Anesthesia Plan: MAC   Post-op Pain Management:    Induction: Intravenous  Airway Management Planned:   Additional Equipment:   Intra-op Plan:   Post-operative Plan: Extubation in OR  Informed Consent: I have reviewed the patients History and Physical, chart, labs and discussed the procedure including the risks, benefits and alternatives for the proposed anesthesia with the patient or authorized representative who has indicated his/her understanding and acceptance.   Dental advisory given  Plan Discussed with: CRNA  Anesthesia Plan Comments:         Anesthesia Quick Evaluation

## 2014-02-13 ENCOUNTER — Encounter (HOSPITAL_COMMUNITY): Payer: Self-pay | Admitting: Gastroenterology

## 2014-02-13 NOTE — Anesthesia Postprocedure Evaluation (Signed)
  Anesthesia Post-op Note  Patient: Steven Best  Procedure(s) Performed: Procedure(s) (LRB): ESOPHAGOGASTRODUODENOSCOPY (EGD) WITH PROPOFOL (N/A) GASTRIC VARICES BANDING (N/A)  Patient Location: PACU  Anesthesia Type: MAC  Level of Consciousness: awake and alert   Airway and Oxygen Therapy: Patient Spontanous Breathing  Post-op Pain: mild  Post-op Assessment: Post-op Vital signs reviewed, Patient's Cardiovascular Status Stable, Respiratory Function Stable, Patent Airway and No signs of Nausea or vomiting  Last Vitals:  Filed Vitals:   02/12/14 1044  BP: 129/71  Temp:   Resp: 18    Post-op Vital Signs: stable   Complications: No apparent anesthesia complications

## 2014-03-05 ENCOUNTER — Other Ambulatory Visit (HOSPITAL_COMMUNITY): Payer: Self-pay | Admitting: Gastroenterology

## 2014-03-05 DIAGNOSIS — K7460 Unspecified cirrhosis of liver: Secondary | ICD-10-CM

## 2014-03-06 ENCOUNTER — Other Ambulatory Visit (HOSPITAL_COMMUNITY): Payer: Self-pay | Admitting: Gastroenterology

## 2014-03-06 ENCOUNTER — Telehealth (HOSPITAL_COMMUNITY): Payer: Self-pay | Admitting: Gastroenterology

## 2014-03-06 DIAGNOSIS — K7460 Unspecified cirrhosis of liver: Secondary | ICD-10-CM

## 2014-03-06 NOTE — Telephone Encounter (Signed)
Called pt and then called his wife's phone #. Left VM on both phones for them to call to get his appt info JM

## 2014-03-11 ENCOUNTER — Encounter (HOSPITAL_COMMUNITY): Payer: Self-pay | Admitting: Pharmacy Technician

## 2014-03-11 ENCOUNTER — Other Ambulatory Visit: Payer: Self-pay | Admitting: Radiology

## 2014-03-12 ENCOUNTER — Ambulatory Visit (HOSPITAL_COMMUNITY)
Admission: RE | Admit: 2014-03-12 | Discharge: 2014-03-12 | Disposition: A | Discharge status: Home / Self Care | Payer: BC Managed Care – PPO | Source: Ambulatory Visit | Attending: Gastroenterology | Admitting: Gastroenterology

## 2014-03-12 ENCOUNTER — Encounter (HOSPITAL_COMMUNITY): Payer: Self-pay

## 2014-03-12 ENCOUNTER — Other Ambulatory Visit (HOSPITAL_COMMUNITY): Payer: Self-pay | Admitting: Gastroenterology

## 2014-03-12 DIAGNOSIS — G43909 Migraine, unspecified, not intractable, without status migrainosus: Secondary | ICD-10-CM | POA: Insufficient documentation

## 2014-03-12 DIAGNOSIS — N189 Chronic kidney disease, unspecified: Secondary | ICD-10-CM | POA: Insufficient documentation

## 2014-03-12 DIAGNOSIS — Z87891 Personal history of nicotine dependence: Secondary | ICD-10-CM | POA: Insufficient documentation

## 2014-03-12 DIAGNOSIS — F411 Generalized anxiety disorder: Secondary | ICD-10-CM | POA: Insufficient documentation

## 2014-03-12 DIAGNOSIS — K7460 Unspecified cirrhosis of liver: Secondary | ICD-10-CM | POA: Insufficient documentation

## 2014-03-12 DIAGNOSIS — K219 Gastro-esophageal reflux disease without esophagitis: Secondary | ICD-10-CM | POA: Insufficient documentation

## 2014-03-12 DIAGNOSIS — I129 Hypertensive chronic kidney disease with stage 1 through stage 4 chronic kidney disease, or unspecified chronic kidney disease: Secondary | ICD-10-CM | POA: Insufficient documentation

## 2014-03-12 DIAGNOSIS — K759 Inflammatory liver disease, unspecified: Secondary | ICD-10-CM | POA: Insufficient documentation

## 2014-03-12 HISTORY — PX: LIVER BIOPSY: SHX301

## 2014-03-12 LAB — PROTIME-INR
INR: 1.05 (ref 0.00–1.49)
Prothrombin Time: 13.5 seconds (ref 11.6–15.2)

## 2014-03-12 LAB — CBC WITH DIFFERENTIAL/PLATELET
BASOS PCT: 1 % (ref 0–1)
Basophils Absolute: 0 10*3/uL (ref 0.0–0.1)
Eosinophils Absolute: 0.1 10*3/uL (ref 0.0–0.7)
Eosinophils Relative: 4 % (ref 0–5)
HCT: 29.4 % — ABNORMAL LOW (ref 39.0–52.0)
HEMOGLOBIN: 10.4 g/dL — AB (ref 13.0–17.0)
LYMPHS ABS: 0.4 10*3/uL — AB (ref 0.7–4.0)
Lymphocytes Relative: 13 % (ref 12–46)
MCH: 32.8 pg (ref 26.0–34.0)
MCHC: 35.4 g/dL (ref 30.0–36.0)
MCV: 92.7 fL (ref 78.0–100.0)
MONO ABS: 0.4 10*3/uL (ref 0.1–1.0)
MONOS PCT: 13 % — AB (ref 3–12)
NEUTROS ABS: 2 10*3/uL (ref 1.7–7.7)
NEUTROS PCT: 69 % (ref 43–77)
Platelets: 122 10*3/uL — ABNORMAL LOW (ref 150–400)
RBC: 3.17 MIL/uL — AB (ref 4.22–5.81)
RDW: 13.2 % (ref 11.5–15.5)
WBC: 2.9 10*3/uL — ABNORMAL LOW (ref 4.0–10.5)

## 2014-03-12 LAB — BASIC METABOLIC PANEL
BUN: 62 mg/dL — ABNORMAL HIGH (ref 6–23)
CALCIUM: 10.6 mg/dL — AB (ref 8.4–10.5)
CHLORIDE: 97 meq/L (ref 96–112)
CO2: 19 meq/L (ref 19–32)
Creatinine, Ser: 7.43 mg/dL — ABNORMAL HIGH (ref 0.50–1.35)
GFR calc Af Amer: 9 mL/min — ABNORMAL LOW (ref >90)
GFR calc non Af Amer: 8 mL/min — ABNORMAL LOW (ref >90)
GLUCOSE: 101 mg/dL — AB (ref 70–99)
POTASSIUM: 4.5 meq/L (ref 3.7–5.3)
SODIUM: 137 meq/L (ref 137–147)

## 2014-03-12 LAB — APTT: APTT: 35 s (ref 24–37)

## 2014-03-12 MED ORDER — SODIUM CHLORIDE 0.9 % IV SOLN: INTRAVENOUS | Status: DC

## 2014-03-12 MED ORDER — HYDROCODONE-ACETAMINOPHEN 5-325 MG PO TABS
1.0000 | ORAL_TABLET | ORAL | Status: DC | PRN
  Administered 2014-03-12: 2 via ORAL
  Filled 2014-03-12: qty 2

## 2014-03-12 MED ORDER — MIDAZOLAM HCL 2 MG/2ML IJ SOLN
INTRAMUSCULAR | Status: AC
  Filled 2014-03-12: qty 4

## 2014-03-12 MED ORDER — FENTANYL CITRATE 0.05 MG/ML IJ SOLN
INTRAMUSCULAR | Status: AC
  Filled 2014-03-12: qty 4

## 2014-03-12 MED ORDER — MIDAZOLAM HCL 2 MG/2ML IJ SOLN
INTRAMUSCULAR | Status: AC | PRN
  Administered 2014-03-12: 2 mg via INTRAVENOUS
  Administered 2014-03-12 (×2): 1 mg via INTRAVENOUS

## 2014-03-12 MED ORDER — FENTANYL CITRATE 0.05 MG/ML IJ SOLN
INTRAMUSCULAR | Status: AC | PRN
  Administered 2014-03-12 (×2): 50 ug via INTRAVENOUS
  Administered 2014-03-12 (×2): 25 ug via INTRAVENOUS
  Administered 2014-03-12: 50 ug via INTRAVENOUS
  Administered 2014-03-12 (×2): 25 ug via INTRAVENOUS

## 2014-03-12 MED ORDER — FENTANYL CITRATE 0.05 MG/ML IJ SOLN
INTRAMUSCULAR | Status: AC
  Filled 2014-03-12: qty 2

## 2014-03-12 NOTE — Sedation Documentation (Signed)
pressure IVC 50mmhg  RA 20/13 (16)

## 2014-03-12 NOTE — Procedures (Signed)
Interventional Radiology Procedure Note  Procedure: 1.) Transjugular hepatic venous pressures 2.) Transjugular liver biopsy Complications: None Recommendations: - Bedrest x 2 hrs - Path pending  Signed,  Criselda Peaches, MD Vascular & Interventional Radiologist [facility]

## 2014-03-12 NOTE — H&P (Signed)
Chief Complaint: "I'm here for a liver biopsy" Referring Physician:Outlaw HPI: Steven Best is an 48 y.o. male with cirrhosis as well as renal disease. He is referred for transjugular liver biopsy with wedge pressures. PMHx and meds reviewed. Has CKD and a (L)forearm AVF that he is not currently using. He plans to start Peritoneal dialysis at some point, but the AVF is just in case he needs to have urgent HD.   Past Medical History:  Past Medical History  Diagnosis Date  . Anxiety   . HTN (hypertension)   . GERD (gastroesophageal reflux disease)     no longer, since having weight loss  . Headache(784.0)     Migraines, not as bad now  . Anemia   . Complication of anesthesia     bp dropped, difficulty to wake up with 1st knee surgery  . Pneumonia 01-30-14    "bacterial infection in lungs"-none recent  . Chronic kidney disease     [facility]Dr. Abner Greenspan dialysis yet.  . AVF (arteriovenous fistula) 01-30-14    Left arm created 11'14    Past Surgical History:  Past Surgical History  Procedure Laterality Date  . Knee arthroscopy Bilateral   . Av fistula placement Left 10/18/2013    Procedure: ARTERIOVENOUS (AV) FISTULA CREATION; ULTRASOUND GUIDED;  Surgeon: Angelia Mould, MD;  Location: [facility];  Service: Vascular;  Laterality: Left;  . Tonsillectomy      child  . Esophagogastroduodenoscopy (egd) with propofol N/A 02/12/2014    Procedure: ESOPHAGOGASTRODUODENOSCOPY (EGD) WITH PROPOFOL;  Surgeon: Arta Silence, MD;  Location: [facility];  Service: Endoscopy;  Laterality: N/A;  . Gastric varices banding N/A 02/12/2014    Procedure: GASTRIC VARICES BANDING;  Surgeon: Arta Silence, MD;  Location: [facility];  Service: Endoscopy;  Laterality: N/A;  possible banding    Family History:  Family History  Problem Relation Age of Onset  . Diabetes Mother   . Heart disease Mother     before age 16  . Hyperlipidemia Mother   . Hypertension Mother   .  COPD Mother   . Diabetes Father   . Heart disease Father     before age 62  . Hyperlipidemia Father   . Hypertension Father   . Cancer Sister   . Hyperlipidemia Sister   . Hypertension Sister     Social History:  reports that he quit smoking about 17 years ago. His smoking use included Cigarettes. He has a 32 pack-year smoking history. He has never used smokeless tobacco. He reports that he drinks about 4.8 ounces of alcohol per week. He reports that he does not use illicit drugs.  Allergies: No Known Allergies  Medications:   Medication List    ASK your doctor about these medications       ALPRAZolam 1 MG tablet  Commonly known as:  XANAX  Take 1 mg by mouth 2 (two) times daily as needed. For anxiety     amLODipine 10 MG tablet  Commonly known as:  NORVASC  Take 5 mg by mouth daily.     cyclobenzaprine 10 MG tablet  Commonly known as:  FLEXERIL  Take 10 mg by mouth 3 (three) times daily as needed. For muscle spasms.     HYDROcodone-acetaminophen 10-325 MG per tablet  Commonly known as:  NORCO  Take 2 tablets by mouth 2 (two) times daily as needed for severe pain.     sevelamer carbonate 800 MG tablet  Commonly known as:  RENVELA  Take 800 mg by mouth 3 (three) times daily with meals.     sodium chloride 2 % ophthalmic solution  Commonly known as:  MURO 128  Place 2 drops into both eyes every 4 (four) hours as needed for irritation.     vitamin B-12 250 MCG tablet  Commonly known as:  CYANOCOBALAMIN  Take 250 mcg by mouth daily.        Please HPI for pertinent positives, otherwise complete 10 system ROS negative.  Physical Exam: BP 135/69  Pulse 98  Temp(Src) 98 F (36.7 C) (Oral)  Resp 18  Ht 6' 4.5" (1.943 m)  Wt 290 lb (131.543 kg)  BMI 34.84 kg/m2  SpO2 100% Body mass index is 34.84 kg/(m^2).   General Appearance:  Alert, cooperative, no distress, appears stated age  Head:  Normocephalic, without obvious abnormality, atraumatic  ENT:  Unremarkable airway  Neck: Supple, symmetrical, trachea midline. No JVD  Lungs:   Clear to auscultation bilaterally, no w/r/r, respirations unlabored without use of accessory muscles.  Chest Wall:  No tenderness or deformity  Heart:  Regular rate and rhythm, S1, S2 normal, no murmur, rub or gallop.  Abdomen:   Soft, non-tender, non distended.  Extremities: Palpable (L)forearm AVF with good thrill  Pulses: 2+ and symmetric  Neurologic: Normal affect, no gross deficits.   Results for orders placed during the hospital encounter of 03/12/14 (from the past 48 hour(s))  APTT     Status: None   Collection Time    03/12/14  8:45 AM      Result Value Ref Range   aPTT 35  24 - 37 seconds  CBC WITH DIFFERENTIAL     Status: Abnormal   Collection Time    03/12/14  8:45 AM      Result Value Ref Range   WBC 2.9 (*) 4.0 - 10.5 K/uL   RBC 3.17 (*) 4.22 - 5.81 MIL/uL   Hemoglobin 10.4 (*) 13.0 - 17.0 g/dL   HCT 29.4 (*) 39.0 - 52.0 %   MCV 92.7  78.0 - 100.0 fL   MCH 32.8  26.0 - 34.0 pg   MCHC 35.4  30.0 - 36.0 g/dL   RDW 13.2  11.5 - 15.5 %   Platelets 122 (*) 150 - 400 K/uL   Neutrophils Relative % 69  43 - 77 %   Neutro Abs 2.0  1.7 - 7.7 K/uL   Lymphocytes Relative 13  12 - 46 %   Lymphs Abs 0.4 (*) 0.7 - 4.0 K/uL   Monocytes Relative 13 (*) 3 - 12 %   Monocytes Absolute 0.4  0.1 - 1.0 K/uL   Eosinophils Relative 4  0 - 5 %   Eosinophils Absolute 0.1  0.0 - 0.7 K/uL   Basophils Relative 1  0 - 1 %   Basophils Absolute 0.0  0.0 - 0.1 K/uL  PROTIME-INR     Status: None   Collection Time    03/12/14  8:45 AM      Result Value Ref Range   Prothrombin Time 13.5  11.6 - 15.2 seconds   INR 1.05  0.00 - 9.38  BASIC METABOLIC PANEL     Status: Abnormal   Collection Time    03/12/14  8:45 AM      Result Value Ref Range   Sodium 137  137 - 147 mEq/L   Potassium 4.5  3.7 - 5.3 mEq/L   Chloride 97  96 - 112 mEq/L   CO2 19  19 - 32 mEq/L   Glucose, Bld 101 (*) 70 - 99 mg/dL   BUN 62 (*) 6  - 23 mg/dL   Creatinine, Ser 7.43 (*) 0.50 - 1.35 mg/dL   Calcium 10.6 (*) 8.4 - 10.5 mg/dL   GFR calc non Af Amer 8 (*) >90 mL/min   GFR calc Af Amer 9 (*) >90 mL/min   Comment: (NOTE)     The eGFR has been calculated using the CKD EPI equation.     This calculation has not been validated in all clinical situations.     eGFR's persistently <90 mL/min signify possible Chronic Kidney     Disease.   No results found.  Assessment/Plan Cirrhosis CKD For transjugular liver biopsy and wedge pressures. Discussed procedure, risks, complications, use of sedation with patient. Labs reviewed Consent signed in chart  Ascencion Dike PA-C 03/12/2014, 10:03 AM

## 2014-03-12 NOTE — Sedation Documentation (Signed)
2nd run Free hepatic 93mmhg Occluded hepatic 31 mmhg

## 2014-03-12 NOTE — Sedation Documentation (Signed)
Free hepatic pressure 16mmhg Occluded hepatic vein pressure 52mmhg

## 2014-03-12 NOTE — Sedation Documentation (Signed)
3rd Hepatic free 20 mmhg Occluded  78mmhg

## 2014-03-12 NOTE — Discharge Instructions (Signed)
Needle Biopsy °Care After °These instructions give you information on caring for yourself after your procedure. Your doctor may also give you more specific instructions. Call your doctor if you have any problems or questions after your procedure. °HOME CARE °· Rest for 4 hours after your biopsy, except for getting up to go to the bathroom or as told. °· Keep the places where the needles were put in clean and dry. °· Do not put powder or lotion on the sites. °· Do not shower until 24 hours after the test. Remove all bandages (dressings) before showering. °· Remove all bandages at least once every day. Gently clean the sites with soap and water. Keep putting a new bandage on until the skin is closed. °Finding out the results of your test °Ask your doctor when your test results will be ready. Make sure you follow up and get the test results. °GET HELP RIGHT AWAY IF:  °· You have shortness of breath or trouble breathing. °· You have pain or cramping in your belly (abdomen). °· You feel sick to your stomach (nauseous) or throw up (vomit). °· Any of the places where the needles were put in: °· Are puffy (swollen) or red. °· Are sore or hot to the touch. °· Are draining yellowish-white fluid (pus). °· Are bleeding after 10 minutes of pressing down on the site. Have someone keep pressing on any place that is bleeding until you see a doctor. °· You have any unusual pain that will not stop. °· You have a fever. °If you go to the emergency room, tell the nurse that you had a biopsy. Take this paper with you to show the nurse. °MAKE SURE YOU:  °· Understand these instructions. °· Will watch your condition. °· Will get help right away if you are not doing well or get worse. °Document Released: 10/27/2008 Document Revised: 02/06/2012 Document Reviewed: 10/27/2008 °ExitCare® Patient Information ©2014 [employer]. ° ° °

## 2014-03-17 ENCOUNTER — Inpatient Hospital Stay (HOSPITAL_COMMUNITY)
Admission: EM | Admit: 2014-03-17 | Discharge: 2014-03-25 | DRG: 417 | Disposition: A | Discharge status: Home / Self Care | Payer: BC Managed Care – PPO | Attending: Internal Medicine | Admitting: Internal Medicine

## 2014-03-17 ENCOUNTER — Emergency Department (HOSPITAL_COMMUNITY): Payer: BC Managed Care – PPO

## 2014-03-17 ENCOUNTER — Other Ambulatory Visit: Payer: Self-pay

## 2014-03-17 ENCOUNTER — Encounter (HOSPITAL_COMMUNITY): Payer: Self-pay | Admitting: Emergency Medicine

## 2014-03-17 ENCOUNTER — Inpatient Hospital Stay (HOSPITAL_COMMUNITY): Payer: BC Managed Care – PPO

## 2014-03-17 DIAGNOSIS — E785 Hyperlipidemia, unspecified: Secondary | ICD-10-CM | POA: Diagnosis present

## 2014-03-17 DIAGNOSIS — N179 Acute kidney failure, unspecified: Secondary | ICD-10-CM

## 2014-03-17 DIAGNOSIS — R599 Enlarged lymph nodes, unspecified: Secondary | ICD-10-CM | POA: Diagnosis present

## 2014-03-17 DIAGNOSIS — I120 Hypertensive chronic kidney disease with stage 5 chronic kidney disease or end stage renal disease: Secondary | ICD-10-CM | POA: Diagnosis present

## 2014-03-17 DIAGNOSIS — K219 Gastro-esophageal reflux disease without esophagitis: Secondary | ICD-10-CM | POA: Diagnosis present

## 2014-03-17 DIAGNOSIS — G8918 Other acute postprocedural pain: Secondary | ICD-10-CM | POA: Diagnosis not present

## 2014-03-17 DIAGNOSIS — N185 Chronic kidney disease, stage 5: Secondary | ICD-10-CM | POA: Diagnosis present

## 2014-03-17 DIAGNOSIS — Z992 Dependence on renal dialysis: Secondary | ICD-10-CM

## 2014-03-17 DIAGNOSIS — K759 Inflammatory liver disease, unspecified: Secondary | ICD-10-CM | POA: Diagnosis present

## 2014-03-17 DIAGNOSIS — E872 Acidosis, unspecified: Secondary | ICD-10-CM | POA: Diagnosis present

## 2014-03-17 DIAGNOSIS — E876 Hypokalemia: Secondary | ICD-10-CM | POA: Diagnosis not present

## 2014-03-17 DIAGNOSIS — Z8249 Family history of ischemic heart disease and other diseases of the circulatory system: Secondary | ICD-10-CM

## 2014-03-17 DIAGNOSIS — D6959 Other secondary thrombocytopenia: Secondary | ICD-10-CM | POA: Diagnosis present

## 2014-03-17 DIAGNOSIS — Z6836 Body mass index (BMI) 36.0-36.9, adult: Secondary | ICD-10-CM

## 2014-03-17 DIAGNOSIS — K8020 Calculus of gallbladder without cholecystitis without obstruction: Secondary | ICD-10-CM

## 2014-03-17 DIAGNOSIS — K766 Portal hypertension: Secondary | ICD-10-CM | POA: Diagnosis present

## 2014-03-17 DIAGNOSIS — D631 Anemia in chronic kidney disease: Secondary | ICD-10-CM | POA: Diagnosis present

## 2014-03-17 DIAGNOSIS — R161 Splenomegaly, not elsewhere classified: Secondary | ICD-10-CM | POA: Diagnosis present

## 2014-03-17 DIAGNOSIS — E669 Obesity, unspecified: Secondary | ICD-10-CM | POA: Diagnosis present

## 2014-03-17 DIAGNOSIS — N170 Acute kidney failure with tubular necrosis: Secondary | ICD-10-CM | POA: Diagnosis not present

## 2014-03-17 DIAGNOSIS — C8590 Non-Hodgkin lymphoma, unspecified, unspecified site: Secondary | ICD-10-CM | POA: Diagnosis present

## 2014-03-17 DIAGNOSIS — D649 Anemia, unspecified: Secondary | ICD-10-CM | POA: Diagnosis present

## 2014-03-17 DIAGNOSIS — N039 Chronic nephritic syndrome with unspecified morphologic changes: Secondary | ICD-10-CM

## 2014-03-17 DIAGNOSIS — F1011 Alcohol abuse, in remission: Secondary | ICD-10-CM | POA: Diagnosis present

## 2014-03-17 DIAGNOSIS — D869 Sarcoidosis, unspecified: Secondary | ICD-10-CM | POA: Diagnosis present

## 2014-03-17 DIAGNOSIS — N320 Bladder-neck obstruction: Secondary | ICD-10-CM | POA: Diagnosis not present

## 2014-03-17 DIAGNOSIS — R109 Unspecified abdominal pain: Secondary | ICD-10-CM

## 2014-03-17 DIAGNOSIS — F411 Generalized anxiety disorder: Secondary | ICD-10-CM | POA: Diagnosis present

## 2014-03-17 DIAGNOSIS — R000 Tachycardia, unspecified: Secondary | ICD-10-CM | POA: Diagnosis not present

## 2014-03-17 DIAGNOSIS — K7030 Alcoholic cirrhosis of liver without ascites: Secondary | ICD-10-CM | POA: Diagnosis present

## 2014-03-17 DIAGNOSIS — N186 End stage renal disease: Secondary | ICD-10-CM | POA: Diagnosis present

## 2014-03-17 DIAGNOSIS — D696 Thrombocytopenia, unspecified: Secondary | ICD-10-CM | POA: Diagnosis present

## 2014-03-17 DIAGNOSIS — Z833 Family history of diabetes mellitus: Secondary | ICD-10-CM

## 2014-03-17 DIAGNOSIS — Z87891 Personal history of nicotine dependence: Secondary | ICD-10-CM

## 2014-03-17 DIAGNOSIS — R590 Localized enlarged lymph nodes: Secondary | ICD-10-CM | POA: Diagnosis present

## 2014-03-17 DIAGNOSIS — K810 Acute cholecystitis: Principal | ICD-10-CM | POA: Diagnosis present

## 2014-03-17 DIAGNOSIS — I10 Essential (primary) hypertension: Secondary | ICD-10-CM

## 2014-03-17 HISTORY — DX: Alcohol abuse, uncomplicated: F10.10

## 2014-03-17 HISTORY — DX: Thrombocytopenia, unspecified: D69.6

## 2014-03-17 LAB — CBC WITH DIFFERENTIAL/PLATELET
Basophils Absolute: 0 10*3/uL (ref 0.0–0.1)
Basophils Relative: 1 % (ref 0–1)
EOS PCT: 4 % (ref 0–5)
Eosinophils Absolute: 0.2 10*3/uL (ref 0.0–0.7)
HEMATOCRIT: 30 % — AB (ref 39.0–52.0)
HEMOGLOBIN: 10.5 g/dL — AB (ref 13.0–17.0)
LYMPHS ABS: 0.4 10*3/uL — AB (ref 0.7–4.0)
Lymphocytes Relative: 8 % — ABNORMAL LOW (ref 12–46)
MCH: 32.3 pg (ref 26.0–34.0)
MCHC: 35 g/dL (ref 30.0–36.0)
MCV: 92.3 fL (ref 78.0–100.0)
MONOS PCT: 13 % — AB (ref 3–12)
Monocytes Absolute: 0.6 10*3/uL (ref 0.1–1.0)
Neutro Abs: 3.3 10*3/uL (ref 1.7–7.7)
Neutrophils Relative %: 74 % (ref 43–77)
Platelets: 56 10*3/uL — ABNORMAL LOW (ref 150–400)
RBC: 3.25 MIL/uL — AB (ref 4.22–5.81)
RDW: 13.3 % (ref 11.5–15.5)
WBC: 4.5 10*3/uL (ref 4.0–10.5)

## 2014-03-17 LAB — LIPASE, BLOOD: Lipase: 52 U/L (ref 11–59)

## 2014-03-17 LAB — COMPREHENSIVE METABOLIC PANEL
ALBUMIN: 4.2 g/dL (ref 3.5–5.2)
ALK PHOS: 143 U/L — AB (ref 39–117)
ALT: 29 U/L (ref 0–53)
AST: 21 U/L (ref 0–37)
BUN: 72 mg/dL — ABNORMAL HIGH (ref 6–23)
CO2: 19 mEq/L (ref 19–32)
Calcium: 12.2 mg/dL — ABNORMAL HIGH (ref 8.4–10.5)
Chloride: 99 mEq/L (ref 96–112)
Creatinine, Ser: 7.86 mg/dL — ABNORMAL HIGH (ref 0.50–1.35)
GFR calc Af Amer: 8 mL/min — ABNORMAL LOW (ref >90)
GFR calc non Af Amer: 7 mL/min — ABNORMAL LOW (ref >90)
Glucose, Bld: 116 mg/dL — ABNORMAL HIGH (ref 70–99)
POTASSIUM: 4.6 meq/L (ref 3.7–5.3)
SODIUM: 138 meq/L (ref 137–147)
Total Bilirubin: 0.5 mg/dL (ref 0.3–1.2)
Total Protein: 9.1 g/dL — ABNORMAL HIGH (ref 6.0–8.3)

## 2014-03-17 LAB — PROTIME-INR
INR: 1.01 (ref 0.00–1.49)
Prothrombin Time: 13.1 seconds (ref 11.6–15.2)

## 2014-03-17 LAB — I-STAT TROPONIN, ED: TROPONIN I, POC: 0 ng/mL (ref 0.00–0.08)

## 2014-03-17 MED ORDER — DIPHENHYDRAMINE HCL 50 MG/ML IJ SOLN
25.0000 mg | Freq: Once | INTRAMUSCULAR | Status: AC
Start: 2014-03-17 — End: 2014-03-17
  Administered 2014-03-17: 25 mg via INTRAVENOUS

## 2014-03-17 MED ORDER — SODIUM CHLORIDE 0.9 % IV SOLN
INTRAVENOUS | Status: DC
  Administered 2014-03-17 – 2014-03-20 (×5): via INTRAVENOUS

## 2014-03-17 MED ORDER — AMLODIPINE BESYLATE 5 MG PO TABS
5.0000 mg | ORAL_TABLET | Freq: Every day | ORAL | Status: DC
  Administered 2014-03-19 – 2014-03-21 (×3): 5 mg via ORAL
  Filled 2014-03-17 (×4): qty 1

## 2014-03-17 MED ORDER — CYCLOBENZAPRINE HCL 10 MG PO TABS
10.0000 mg | ORAL_TABLET | Freq: Three times a day (TID) | ORAL | Status: DC | PRN
  Administered 2014-03-20: 10 mg via ORAL
  Filled 2014-03-17: qty 1

## 2014-03-17 MED ORDER — LEVOFLOXACIN IN D5W 500 MG/100ML IV SOLN
500.0000 mg | INTRAVENOUS | Status: DC
  Administered 2014-03-19 – 2014-03-23 (×3): 500 mg via INTRAVENOUS
  Filled 2014-03-17 (×4): qty 100

## 2014-03-17 MED ORDER — PANTOPRAZOLE SODIUM 40 MG IV SOLR
40.0000 mg | Freq: Two times a day (BID) | INTRAVENOUS | Status: DC
  Administered 2014-03-17 – 2014-03-21 (×9): 40 mg via INTRAVENOUS
  Filled 2014-03-17 (×12): qty 40

## 2014-03-17 MED ORDER — MORPHINE SULFATE 4 MG/ML IJ SOLN
4.0000 mg | Freq: Once | INTRAMUSCULAR | Status: AC
  Administered 2014-03-17: 4 mg via INTRAVENOUS
  Filled 2014-03-17: qty 1

## 2014-03-17 MED ORDER — SODIUM CHLORIDE 0.9 % IJ SOLN
3.0000 mL | Freq: Two times a day (BID) | INTRAMUSCULAR | Status: DC
  Administered 2014-03-19 – 2014-03-24 (×4): 3 mL via INTRAVENOUS

## 2014-03-17 MED ORDER — FENTANYL CITRATE 0.05 MG/ML IJ SOLN
50.0000 ug | INTRAMUSCULAR | Status: AC | PRN
  Administered 2014-03-17 (×2): 50 ug via INTRAVENOUS
  Filled 2014-03-17 (×2): qty 2

## 2014-03-17 MED ORDER — DIPHENHYDRAMINE HCL 50 MG/ML IJ SOLN
INTRAMUSCULAR | Status: AC
  Filled 2014-03-17: qty 1

## 2014-03-17 MED ORDER — HYDROMORPHONE HCL PF 1 MG/ML IJ SOLN
1.0000 mg | INTRAMUSCULAR | Status: DC | PRN
  Administered 2014-03-17 – 2014-03-22 (×40): 2 mg via INTRAVENOUS
  Administered 2014-03-23 – 2014-03-25 (×4): 1 mg via INTRAVENOUS
  Filled 2014-03-17 (×6): qty 2
  Filled 2014-03-17: qty 1
  Filled 2014-03-17 (×12): qty 2
  Filled 2014-03-17: qty 1
  Filled 2014-03-17 (×3): qty 2
  Filled 2014-03-17: qty 1
  Filled 2014-03-17 (×16): qty 2
  Filled 2014-03-17: qty 1
  Filled 2014-03-17 (×4): qty 2

## 2014-03-17 MED ORDER — SODIUM CHLORIDE (HYPERTONIC) 2 % OP SOLN: 2.0000 [drp] | OPHTHALMIC | Status: DC | PRN

## 2014-03-17 MED ORDER — CYANOCOBALAMIN 500 MCG PO TABS
250.0000 ug | ORAL_TABLET | Freq: Every day | ORAL | Status: DC
  Administered 2014-03-19 – 2014-03-25 (×7): 250 ug via ORAL
  Filled 2014-03-17 (×8): qty 1

## 2014-03-17 MED ORDER — POLYVINYL ALCOHOL 1.4 % OP SOLN
1.0000 [drp] | OPHTHALMIC | Status: DC | PRN
  Filled 2014-03-17: qty 15

## 2014-03-17 MED ORDER — ONDANSETRON HCL 4 MG/2ML IJ SOLN
4.0000 mg | Freq: Once | INTRAMUSCULAR | Status: AC
  Administered 2014-03-17: 4 mg via INTRAVENOUS
  Filled 2014-03-17: qty 2

## 2014-03-17 MED ORDER — LEVOFLOXACIN IN D5W 750 MG/150ML IV SOLN
750.0000 mg | Freq: Once | INTRAVENOUS | Status: AC
  Administered 2014-03-18: 750 mg via INTRAVENOUS
  Filled 2014-03-17: qty 150

## 2014-03-17 MED ORDER — IOHEXOL 300 MG/ML  SOLN: 25.0000 mL | INTRAMUSCULAR | Status: AC

## 2014-03-17 MED ORDER — METRONIDAZOLE IN NACL 5-0.79 MG/ML-% IV SOLN
500.0000 mg | Freq: Three times a day (TID) | INTRAVENOUS | Status: DC
  Administered 2014-03-17 – 2014-03-25 (×23): 500 mg via INTRAVENOUS
  Filled 2014-03-17 (×26): qty 100

## 2014-03-17 MED ORDER — SEVELAMER CARBONATE 800 MG PO TABS
800.0000 mg | ORAL_TABLET | Freq: Three times a day (TID) | ORAL | Status: DC
  Administered 2014-03-19 – 2014-03-22 (×7): 800 mg via ORAL
  Filled 2014-03-17 (×16): qty 1

## 2014-03-17 NOTE — ED Provider Notes (Signed)
  Physical Exam  BP 154/82  Pulse 97  Temp(Src) 98.2 F (36.8 C) (Oral)  Resp 17  Wt 285 lb (129.275 kg)  SpO2 100%  Physical Exam  ED Course  Procedures  Care assumed at sign out from Dr. Elise Benne. Patient had IR liver biopsy a week ago. Has persistent RUQ pain since then. Sign out pending CT. 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 Acute vs chronic chole. He also is more thrombocytopenic but not more anemic. I doubt TTP. He is in pain despite pain meds. Will admit for pain control. I called Dr. Shawn Stall from IR, who can biopsy the nodes. Will transfer to [facility] since oncology is located there.      Wandra Arthurs, MD 03/17/14 [phone]

## 2014-03-17 NOTE — ED Provider Notes (Signed)
CSN: [id number]     Arrival date & time 03/17/14  1155 History   First MD Initiated Contact with Patient 03/17/14 1428     Chief Complaint  Patient presents with  . Abdominal Pain  . Post-op Problem     HPI Pt was seen at 1430.  Per pt, c/o gradual onset and persistence of constant RUQ abd "pain" for the past 4 to 5 days.  Has been associated with decreased appetite.  Describes the abd pain as "stabbing," "dull," and "throbbing."  States his pain began after he had a liver biopsy 5 days ago. Denies N/V/D, no fevers, no back pain, no rash, no CP/SOB, no black or blood in stools.      Past Medical History  Diagnosis Date  . Anxiety   . HTN (hypertension)   . GERD (gastroesophageal reflux disease)     no longer, since having weight loss  . Headache(784.0)     Migraines, not as bad now  . Anemia   . Complication of anesthesia     bp dropped, difficulty to wake up with 1st knee surgery  . Pneumonia 01-30-14    "bacterial infection in lungs"-none recent  . Chronic kidney disease     [facility]Dr. Abner Greenspan dialysis yet.  . AVF (arteriovenous fistula) 01-30-14    Left arm created 11'14  . Liver cirrhosis   . Alcohol abuse     quit 2014  . Thrombocytopenia    Past Surgical History  Procedure Laterality Date  . Knee arthroscopy Bilateral   . Av fistula placement Left 10/18/2013    Procedure: ARTERIOVENOUS (AV) FISTULA CREATION; ULTRASOUND GUIDED;  Surgeon: Angelia Mould, MD;  Location: [facility];  Service: Vascular;  Laterality: Left;  . Tonsillectomy      child  . Esophagogastroduodenoscopy (egd) with propofol N/A 02/12/2014    Procedure: ESOPHAGOGASTRODUODENOSCOPY (EGD) WITH PROPOFOL;  Surgeon: Arta Silence, MD;  Location: [facility];  Service: Endoscopy;  Laterality: N/A;  . Gastric varices banding N/A 02/12/2014    Procedure: GASTRIC VARICES BANDING;  Surgeon: Arta Silence, MD;  Location: [facility];  Service: Endoscopy;  Laterality: N/A;  possible  banding  . Esophagogastroduodenoscopy  01/2014    no gastric or esophageal varies, question gastroparesis  . Liver biopsy  03/12/2014    IR transjugular liver biopsy   Family History  Problem Relation Age of Onset  . Diabetes Mother   . Heart disease Mother     before age 25  . Hyperlipidemia Mother   . Hypertension Mother   . COPD Mother   . Diabetes Father   . Heart disease Father     before age 51  . Hyperlipidemia Father   . Hypertension Father   . Cancer Sister   . Hyperlipidemia Sister   . Hypertension Sister    History  Substance Use Topics  . Smoking status: Former Smoker -- 2.00 packs/day for 16 years    Types: Cigarettes    Quit date: 09/20/1996  . Smokeless tobacco: Never Used  . Alcohol Use: 4.8 oz/week    0 Glasses of wine, 4 Cans of beer, 4 Shots of liquor per week     Comment: Quit 3 months ago 11'14    Review of Systems ROS: Statement: All systems negative except as marked or noted in the HPI; Constitutional: Negative for fever and chills. ; ; Eyes: Negative for eye pain, redness and discharge. ; ; ENMT: Negative for ear pain, hoarseness, nasal congestion, sinus pressure and  sore throat. ; ; Cardiovascular: Negative for chest pain, palpitations, diaphoresis, dyspnea and peripheral edema. ; ; Respiratory: Negative for cough, wheezing and stridor. ; ; Gastrointestinal: +abd pain. Negative for nausea, vomiting, diarrhea, blood in stool, hematemesis, jaundice and rectal bleeding. . ; ; Genitourinary: Negative for dysuria, flank pain and hematuria. ; ; Musculoskeletal: Negative for back pain and neck pain. Negative for swelling and trauma.; ; Skin: Negative for pruritus, rash, abrasions, blisters, bruising and skin lesion.; ; Neuro: Negative for headache, lightheadedness and neck stiffness. Negative for weakness, altered level of consciousness , altered mental status, extremity weakness, paresthesias, involuntary movement, seizure and syncope.      Allergies  Review  of patient's allergies indicates no known allergies.  Home Medications   Prior to Admission medications   Medication Sig Start Date End Date Taking? Authorizing Provider  ALPRAZolam Duanne Moron) 1 MG tablet Take 1 mg by mouth 2 (two) times daily as needed. For anxiety   Yes Historical Provider, MD  amLODipine (NORVASC) 10 MG tablet Take 5 mg by mouth daily.    Yes Historical Provider, MD  cyclobenzaprine (FLEXERIL) 10 MG tablet Take 10 mg by mouth 3 (three) times daily as needed. For muscle spasms.   Yes Historical Provider, MD  HYDROcodone-acetaminophen (NORCO) 10-325 MG per tablet Take 2 tablets by mouth 2 (two) times daily as needed for severe pain.    Yes Historical Provider, MD  sevelamer carbonate (RENVELA) 800 MG tablet Take 800 mg by mouth 3 (three) times daily with meals.   Yes Historical Provider, MD  sodium chloride (MURO 128) 2 % ophthalmic solution Place 2 drops into both eyes every 4 (four) hours as needed for irritation.   Yes Historical Provider, MD  vitamin B-12 (CYANOCOBALAMIN) 250 MCG tablet Take 250 mcg by mouth daily.   Yes Historical Provider, MD   BP 149/82  Pulse 98  Temp(Src) 98.2 F (36.8 C) (Oral)  Resp 17  Wt 285 lb (129.275 kg)  SpO2 99% Physical Exam 1435: Physical examination:  Nursing notes reviewed; Vital signs and O2 SAT reviewed;  Constitutional: Well developed, Well nourished, Well hydrated, In no acute distress; Head:  Normocephalic, atraumatic; Eyes: EOMI, PERRL, No scleral icterus; ENMT: Mouth and pharynx normal, Mucous membranes moist; Neck: Supple, Full range of motion, No lymphadenopathy; Cardiovascular: Tachycardic rate and rhythm, No gallop; Respiratory: Breath sounds clear & equal bilaterally, No wheezes.  Speaking full sentences with ease, Normal respiratory effort/excursion; Chest: Nontender, Movement normal; Abdomen: Soft, +RUQ tenderness to palp. No rebound. Nondistended, Normal bowel sounds; Genitourinary: No CVA tenderness; Spine:  No midline CS,  TS, LS tenderness. No rash.;; Extremities: Pulses normal, No tenderness, No edema, No calf edema or asymmetry.; Neuro: AA&Ox3, Major CN grossly intact.  Speech clear. No gross focal motor or sensory deficits in extremities. Climbs on and off stretcher easily by himself. Gait steady.; Skin: Color normal, Warm, Dry.   ED Course  Procedures   EKG Interpretation   Date/Time:  Monday March 17 2014 12:05:37 EDT Ventricular Rate:  116 PR Interval:  150 QRS Duration: 92 QT Interval:  316 QTC Calculation: 439 R Axis:   -28 Text Interpretation:  Sinus tachycardia Left axis deviation Left  ventricular hypertrophy with repolarization abnormality Abnormal ECG When  compared with ECG of 10/14/2013 No significant change was found Confirmed  by [facility]  MD, Nunzio Cory [phone]) on 03/17/2014 3:18:22 PM      MDM  MDM Reviewed: previous chart, nursing note and vitals Reviewed previous: labs and ECG Interpretation: ECG,  labs and x-ray    Results for orders placed during the hospital encounter of 03/17/14  COMPREHENSIVE METABOLIC PANEL      Result Value Ref Range   Sodium 138  137 - 147 mEq/L   Potassium 4.6  3.7 - 5.3 mEq/L   Chloride 99  96 - 112 mEq/L   CO2 19  19 - 32 mEq/L   Glucose, Bld 116 (*) 70 - 99 mg/dL   BUN 72 (*) 6 - 23 mg/dL   Creatinine, Ser 7.86 (*) 0.50 - 1.35 mg/dL   Calcium 12.2 (*) 8.4 - 10.5 mg/dL   Total Protein 9.1 (*) 6.0 - 8.3 g/dL   Albumin 4.2  3.5 - 5.2 g/dL   AST 21  0 - 37 U/L   ALT 29  0 - 53 U/L   Alkaline Phosphatase 143 (*) 39 - 117 U/L   Total Bilirubin 0.5  0.3 - 1.2 mg/dL   GFR calc non Af Amer 7 (*) >90 mL/min   GFR calc Af Amer 8 (*) >90 mL/min  LIPASE, BLOOD      Result Value Ref Range   Lipase 52  11 - 59 U/L  CBC WITH DIFFERENTIAL      Result Value Ref Range   WBC 4.5  4.0 - 10.5 K/uL   RBC 3.25 (*) 4.22 - 5.81 MIL/uL   Hemoglobin 10.5 (*) 13.0 - 17.0 g/dL   HCT 30.0 (*) 39.0 - 52.0 %   MCV 92.3  78.0 - 100.0 fL   MCH 32.3  26.0 - 34.0 pg    MCHC 35.0  30.0 - 36.0 g/dL   RDW 13.3  11.5 - 15.5 %   Platelets 56 (*) 150 - 400 K/uL   Neutrophils Relative % 74  43 - 77 %   Lymphocytes Relative 8 (*) 12 - 46 %   Monocytes Relative 13 (*) 3 - 12 %   Eosinophils Relative 4  0 - 5 %   Basophils Relative 1  0 - 1 %   Neutro Abs 3.3  1.7 - 7.7 K/uL   Lymphs Abs 0.4 (*) 0.7 - 4.0 K/uL   Monocytes Absolute 0.6  0.1 - 1.0 K/uL   Eosinophils Absolute 0.2  0.0 - 0.7 K/uL   Basophils Absolute 0.0  0.0 - 0.1 K/uL  I-STAT TROPOININ, ED      Result Value Ref Range   Troponin i, poc 0.00  0.00 - 0.08 ng/mL   Comment 3            Dg Chest 2 View 03/17/2014   CLINICAL DATA:  Shortness of breath  EXAM: CHEST  2 VIEW  COMPARISON:  None.  FINDINGS: The cardiomediastinal silhouette is unremarkable.  There is no evidence of focal airspace disease, pulmonary edema, suspicious pulmonary nodule/mass, pleural effusion, or pneumothorax. No acute bony abnormalities are identified.  IMPRESSION: No active cardiopulmonary disease.   Electronically Signed   By: Hassan Rowan M.D.   On: 03/17/2014 15:30    1700:  Pt's VS remain stable. IV pain meds given. CT A/P pending, dispo based on results. Sign out to Dr. Darl Householder.          Alfonzo Feller, DO 03/17/14 1701

## 2014-03-17 NOTE — ED Notes (Addendum)
Pt reports he had a liver biopsy last Wednesday. States since procedure he has been having pain on his right side, has been SOB, and decreased appetite. Pt awake, pale, CR>2, reports 5lb weight loss since last week.

## 2014-03-17 NOTE — H&P (Signed)
Hospitalist Admission History and Physical  Patient name: Steven Best Medical record number: [id number] Date of birth: Oct 13, 1966 Age: 48 y.o. Gender: male  Primary Care Provider: Shirline Frees, MD Gastroenterology: [facility] Renal: Emmit Pomfret     Chief Complaint: abdominal pain   History of Present Illness:This is a 48 y.o. year old male with multiple medical problems including end-stage renal disease still making urine with maturing left upper extremity fistula, alcoholic cirrhosis, thrombocytopenia presenting with worsening pain. Patient is reported to have had a liver biopsy performed within last week by interventional radiology for further evaluation of cirrhosis. Patient states that since the biopsy was performed, he has had worsening abdominal pain that is minimally improved oral narcotics at home. Patient states he's also had worsening lower back and leg pain over the past 2 months. Patient denies any other significant systemic symptoms including nausea, hematemesis, diarrhea, dysuria, fever, chills, petechiae, jaundice. States that he quit drinking greater than 4 months ago. Was sent for biopsy by Dr. Paulita Fujita. Present to the ER today because of worsening pain. Had a CT scan of the abdomen showed no active complications from specific biopsy site but did show extensive upper abdominal and retroperitoneal adenopathy concerning for lymphoma. There is also mild distended gallbladder gallbladder thickening.? Lingular pneumonia versus atelectasis. Right adrenal adenoma. Creatinine today at 7.6. Baseline seems to be between 5 and 7. Calcium 12.2. Alkaline phosphatase 143. Lipase within normal limits at 52. Platelet count is 56. Hemodynamically stable. Afebrile. Satting 99-100% on room air.  Patient Active Problem List   Diagnosis Date Noted  . Lymphoma 03/17/2014  . End stage renal disease 10/02/2013  . ARF (acute renal failure) 09/19/2012  . Anxiety   . HTN (hypertension)    Past  Medical History: Past Medical History  Diagnosis Date  . Anxiety   . HTN (hypertension)   . GERD (gastroesophageal reflux disease)     no longer, since having weight loss  . Headache(784.0)     Migraines, not as bad now  . Anemia   . Complication of anesthesia     bp dropped, difficulty to wake up with 1st knee surgery  . Pneumonia 01-30-14    "bacterial infection in lungs"-none recent  . Chronic kidney disease     [facility]Dr. Abner Greenspan dialysis yet.  . AVF (arteriovenous fistula) 01-30-14    Left arm created 11'14  . Liver cirrhosis   . Alcohol abuse     quit 2014  . Thrombocytopenia     Past Surgical History: Past Surgical History  Procedure Laterality Date  . Knee arthroscopy Bilateral   . Av fistula placement Left 10/18/2013    Procedure: ARTERIOVENOUS (AV) FISTULA CREATION; ULTRASOUND GUIDED;  Surgeon: Angelia Mould, MD;  Location: [facility];  Service: Vascular;  Laterality: Left;  . Tonsillectomy      child  . Esophagogastroduodenoscopy (egd) with propofol N/A 02/12/2014    Procedure: ESOPHAGOGASTRODUODENOSCOPY (EGD) WITH PROPOFOL;  Surgeon: Arta Silence, MD;  Location: [facility];  Service: Endoscopy;  Laterality: N/A;  . Gastric varices banding N/A 02/12/2014    Procedure: GASTRIC VARICES BANDING;  Surgeon: Arta Silence, MD;  Location: [facility];  Service: Endoscopy;  Laterality: N/A;  possible banding  . Esophagogastroduodenoscopy  01/2014    no gastric or esophageal varies, question gastroparesis  . Liver biopsy  03/12/2014    IR transjugular liver biopsy    Social History: History   Social History  . Marital Status: Married    Spouse Name: N/A  Number of Children: N/A  . Years of Education: N/A   Social History Main Topics  . Smoking status: Former Smoker -- 2.00 packs/day for 16 years    Types: Cigarettes    Quit date: 09/20/1996  . Smokeless tobacco: Never Used  . Alcohol Use: 4.8 oz/week    0 Glasses of wine, 4 Cans of  beer, 4 Shots of liquor per week     Comment: Quit 3 months ago 11'14  . Drug Use: No  . Sexual Activity: Yes   Other Topics Concern  . Not on file   Social History Narrative  . No narrative on file    Family History: Family History  Problem Relation Age of Onset  . Diabetes Mother   . Heart disease Mother     before age 41  . Hyperlipidemia Mother   . Hypertension Mother   . COPD Mother   . Diabetes Father   . Heart disease Father     before age 49  . Hyperlipidemia Father   . Hypertension Father   . Cancer Sister   . Hyperlipidemia Sister   . Hypertension Sister     Allergies: No Known Allergies  Current Facility-Administered Medications  Medication Dose Route Frequency Provider Last Rate Last Dose  . amLODipine (NORVASC) tablet 5 mg  5 mg Oral Daily Shanda Howells, MD      . cyclobenzaprine (FLEXERIL) tablet 10 mg  10 mg Oral TID PRN Shanda Howells, MD      . HYDROmorphone (DILAUDID) injection 1-2 mg  1-2 mg Intravenous Q2H PRN Shanda Howells, MD      . Derrill Memo ON 03/18/2014] sevelamer carbonate (RENVELA) tablet 800 mg  800 mg Oral TID WC Shanda Howells, MD      . sodium chloride (MURO 128) 2 % ophthalmic solution 2 drop  2 drop Both Eyes Q4H PRN Shanda Howells, MD      . sodium chloride 0.9 % injection 3 mL  3 mL Intravenous Q12H Shanda Howells, MD      . vitamin B-12 (CYANOCOBALAMIN) tablet 250 mcg  250 mcg Oral Daily Shanda Howells, MD       Current Outpatient Prescriptions  Medication Sig Dispense Refill  . ALPRAZolam (XANAX) 1 MG tablet Take 1 mg by mouth 2 (two) times daily as needed. For anxiety      . amLODipine (NORVASC) 10 MG tablet Take 5 mg by mouth daily.       . cyclobenzaprine (FLEXERIL) 10 MG tablet Take 10 mg by mouth 3 (three) times daily as needed. For muscle spasms.      Marland Kitchen HYDROcodone-acetaminophen (NORCO) 10-325 MG per tablet Take 2 tablets by mouth 2 (two) times daily as needed for severe pain.       . sevelamer carbonate (RENVELA) 800 MG tablet Take  800 mg by mouth 3 (three) times daily with meals.      . sodium chloride (MURO 128) 2 % ophthalmic solution Place 2 drops into both eyes every 4 (four) hours as needed for irritation.      . vitamin B-12 (CYANOCOBALAMIN) 250 MCG tablet Take 250 mcg by mouth daily.       Review Of Systems: 12 point ROS negative except as noted above in HPI.  Physical Exam: Filed Vitals:   03/17/14 1845  BP:   Pulse: 103  Temp:   Resp:     General: alert, cooperative and moderately obese HEENT: PERRLA and extra ocular movement intact Heart: S1, S2 normal, no murmur,  rub or gallop, regular rate and rhythm Lungs: clear to auscultation, no wheezes or rales and unlabored breathing Abdomen:+ RUQ TTP, no fluid waves  Extremities: extremities normal, atraumatic, no cyanosis or edema Skin:no rashes, no ecchymoses Neurology: normal without focal findings  Labs and Imaging: Lab Results  Component Value Date/Time   NA 138 03/17/2014  2:30 PM   NA 137 05/01/2013 10:33 AM   K 4.6 03/17/2014  2:30 PM   K 4.5 05/01/2013 10:33 AM   CL 99 03/17/2014  2:30 PM   CL 105 05/01/2013 10:33 AM   CO2 19 03/17/2014  2:30 PM   CO2 17* 05/01/2013 10:33 AM   BUN 72* 03/17/2014  2:30 PM   BUN 85.7* 05/01/2013 10:33 AM   CREATININE 7.86* 03/17/2014  2:30 PM   CREATININE 6.6 Repeated and Verified* 05/01/2013 10:33 AM   GLUCOSE 116* 03/17/2014  2:30 PM   GLUCOSE 89 05/01/2013 10:33 AM   Lab Results  Component Value Date   WBC 4.5 03/17/2014   HGB 10.5* 03/17/2014   HCT 30.0* 03/17/2014   MCV 92.3 03/17/2014   PLT 56* 03/17/2014    Ct Abdomen Pelvis Wo Contrast  03/17/2014   CLINICAL DATA:  Right side pain, short of breath and decreased appetite since transjugular liver biopsy last Wednesday.  EXAM: CT ABDOMEN AND PELVIS WITHOUT CONTRAST  TECHNIQUE: Multidetector CT imaging of the abdomen and pelvis was performed following the standard protocol without intravenous contrast.  COMPARISON:  Chest x-ray earlier today 03/17/2014; images from  transjugular hepatic biopsy 03/12/2014  FINDINGS: Lower Chest: Incompletely imaged nonspecific peribronchovascular ground-glass attenuation opacity in the inferior lingula. No evidence of a pneumothorax or pleural effusion. No suspicious pulmonary mass or nodule. Visualized cardiac structures within normal limits for size. Suggestion of atherosclerotic calcification in the coronary arteries. No pericardial effusion. Unremarkable distal thoracic esophagus. Nonspecific 1 cm paraesophageal lymph node (image 13).  Abdomen: Unremarkable CT appearance of the stomach, duodenum, and left adrenal gland. 2 cm low-attenuation (4.25 HU) right adrenal nodule consistent with a benign adenoma. Normal hepatic contour and morphology. No evidence of perihepatic hemorrhage or subcapsular hematoma. No overtly cirrhotic morphology. The gallbladder is distended and contains high attenuation material consistent with sludge and/ stones. No intra or extrahepatic biliary ductal dilatation. There is evidence of mild gallbladder wall thickening as well as a small amounts of inflammatory stranding. The spleen is enlarged. Unremarkable appearance of the pancreas.  Numerous abnormally enlarged upper abdominal lymph nodes. A 1.7 cm short axis node is identified in the gastrohepatic ligament (image 25) numerous additional nodes in the porta hepatis and hepatoduodenal ligament. The largest lies posterior to the pancreatic head and measures 5.0 x 2.6 cm. There is interstitial stranding in the region with mild thickening of the right anterior para renal fascia. Numerous retroperitoneal lymph nodes are conspicuous in number if not by size. A left para-aortic node measures 1.3 cm in short axis. No hemo peritoneum.  No evidence of obstruction or focal bowel wall thickening. Normal appendix in the right lower quadrant. The terminal ileum is unremarkable.  Pelvis: Unremarkable bladder, prostate gland and seminal vesicles. No free fluid or suspicious  adenopathy.  Bones/Soft Tissues: No acute fracture or aggressive appearing lytic or blastic osseous lesion.  Vascular: Limited evaluation in the absence of intravenous contrast. Atherosclerotic vascular disease without significant stenosis or aneurysmal dilatation.  IMPRESSION: 1. No evidence of acute complication related to the patient's biopsy procedure last week. Specifically, no pneumothorax, pleural effusion, perihepatic hemorrhage or subcapsular hematoma.  2. Extensive upper abdominal and retroperitoneal adenopathy highly concerning for lymphoma. Metastatic adenopathy is considered less likely. The largest node measures 5 x 2.6 cm just posterior to the pancreatic head. There is associated interstitial stranding in the adjacent fat which is favored to reflect lymphatic congestion. Recommend further evaluation with PET-CT. 3. Mildly distended gallbladder containing sludge and/or small stones. There is mild thickening of the gallbladder wall. Acute or chronic cholecystitis is not excluded. Given the patient's right upper quadrant pain, consider further evaluation with right upper quadrant ultrasound to assess for cholecystitis. 4. Nonspecific and incompletely imaged ground-glass attenuation opacity in the inferior lingula. Differential considerations include atelectasis and bronchopneumonia. 5. Atherosclerotic vascular calcifications. Suspect coronary artery calcifications in the incompletely imaged heart. 6. Splenomegaly. This may be related to intrinsic liver disease and portal hypertension, or lymphoma. 7. Right adrenal adenoma.   Electronically Signed   By: Jacqulynn Cadet M.D.   On: 03/17/2014 18:02   Dg Chest 2 View  03/17/2014   CLINICAL DATA:  Shortness of breath  EXAM: CHEST  2 VIEW  COMPARISON:  None.  FINDINGS: The cardiomediastinal silhouette is unremarkable.  There is no evidence of focal airspace disease, pulmonary edema, suspicious pulmonary nodule/mass, pleural effusion, or pneumothorax. No  acute bony abnormalities are identified.  IMPRESSION: No active cardiopulmonary disease.   Electronically Signed   By: Hassan Rowan M.D.   On: 03/17/2014 15:30     Assessment and Plan: SAVVAS SHAMBERGER is a 48 y.o. year old male presenting with Pain   Neuro/Pain: Suspect  pain is likely multifactorial contributions of lymphoma noted in right upper abdomen and retroperitoneal space as well as possible cholecystitis. Hypercalcemia may also be a potential etiology. Clinically without evidence of ascending cholangitis on exam. Will place on levaquin and flagyl for GI coverage given ? Overlapping HCAP. Continue with IV dilaudid. May escalate to fentanyl IV/sub q if needed. GI and H-O consult. Per report ER discussed case with IR. They are willing to perform biopsy either at [facility] or [facility]. Defer to H-O recs.   GI: baseline cirrhotic disease. Noted portal gastropathy with esophageal varices on upper endoscopy 01/2014. Preliminarily Child-Pugh score  A pending INR (also for MELD score).  Followed by Dr. Paulita Fujita. Will consult. Check ammonia level. High dose PPI. GI coverage for ? Cholecystitis as above. Consider addition of low dose non selective beta blocker.   Heme: Imaging concerning for lymphoma as above. Continue pain control. Hematology consult. Thrombocytopenia. Likely hepatic in etiology. drop in platelets from 100-50 in the setting of recent procedure. Hemoglobin at baseline in the setting of anemia of chronic disease. Check INR. Avoid anticoagulation and NSAIDs.  Renal: End-stage disease. Still making urine. Maturing left upper extremity fistula. Noted to be a potential transplant candidate. We'll formally consult renal. Avoid NSAIDs and ACE inhibitors. Clinically euvolemic on exam. Noted hypercalcemia in the setting of potential metastatic disease. Check ionized calcium intact PTH. Gentle hydration. Will defer use of IV diuretics to renal given ESRD.  Consider IV calcitonin.    Prophylaxis:  SCDs Disposition: pending further evaluation  Code Status:Full Code.        Shanda Howells MD  Pager: [phone]

## 2014-03-17 NOTE — ED Notes (Signed)
Patient transported to CT 

## 2014-03-17 NOTE — ED Notes (Signed)
Red blotchy rash noted around site of IV.  Pt sts it feels "itchy" but has not scratched it. EDP made aware.  Benadryl given.

## 2014-03-17 NOTE — ED Notes (Signed)
1 failed IV attempt in the right hand

## 2014-03-17 NOTE — ED Notes (Signed)
CT notified that pt is finished with contrast.  

## 2014-03-17 NOTE — ED Notes (Signed)
Pt remains in radiology 

## 2014-03-18 ENCOUNTER — Encounter (HOSPITAL_COMMUNITY): Payer: Self-pay | Admitting: *Deleted

## 2014-03-18 DIAGNOSIS — K810 Acute cholecystitis: Principal | ICD-10-CM

## 2014-03-18 DIAGNOSIS — D649 Anemia, unspecified: Secondary | ICD-10-CM

## 2014-03-18 DIAGNOSIS — D696 Thrombocytopenia, unspecified: Secondary | ICD-10-CM | POA: Diagnosis present

## 2014-03-18 DIAGNOSIS — R590 Localized enlarged lymph nodes: Secondary | ICD-10-CM | POA: Diagnosis present

## 2014-03-18 DIAGNOSIS — R599 Enlarged lymph nodes, unspecified: Secondary | ICD-10-CM

## 2014-03-18 DIAGNOSIS — N185 Chronic kidney disease, stage 5: Secondary | ICD-10-CM | POA: Diagnosis present

## 2014-03-18 DIAGNOSIS — R1011 Right upper quadrant pain: Secondary | ICD-10-CM

## 2014-03-18 LAB — IRON AND TIBC
Iron: 69 ug/dL (ref 42–135)
SATURATION RATIOS: 24 % (ref 20–55)
TIBC: 288 ug/dL (ref 215–435)
UIBC: 219 ug/dL (ref 125–400)

## 2014-03-18 LAB — CBC WITH DIFFERENTIAL/PLATELET
BASOS PCT: 1 % (ref 0–1)
Basophils Absolute: 0 10*3/uL (ref 0.0–0.1)
Eosinophils Absolute: 0.2 10*3/uL (ref 0.0–0.7)
Eosinophils Relative: 4 % (ref 0–5)
HCT: 27.4 % — ABNORMAL LOW (ref 39.0–52.0)
Hemoglobin: 9.5 g/dL — ABNORMAL LOW (ref 13.0–17.0)
Lymphocytes Relative: 10 % — ABNORMAL LOW (ref 12–46)
Lymphs Abs: 0.5 10*3/uL — ABNORMAL LOW (ref 0.7–4.0)
MCH: 32.3 pg (ref 26.0–34.0)
MCHC: 34.7 g/dL (ref 30.0–36.0)
MCV: 93.2 fL (ref 78.0–100.0)
Monocytes Absolute: 0.7 10*3/uL (ref 0.1–1.0)
Monocytes Relative: 15 % — ABNORMAL HIGH (ref 3–12)
NEUTROS ABS: 3.3 10*3/uL (ref 1.7–7.7)
NEUTROS PCT: 70 % (ref 43–77)
PLATELETS: 55 10*3/uL — AB (ref 150–400)
RBC: 2.94 MIL/uL — ABNORMAL LOW (ref 4.22–5.81)
RDW: 13.4 % (ref 11.5–15.5)
WBC: 4.8 10*3/uL (ref 4.0–10.5)

## 2014-03-18 LAB — COMPREHENSIVE METABOLIC PANEL
ALK PHOS: 229 U/L — AB (ref 39–117)
ALT: 37 U/L (ref 0–53)
AST: 41 U/L — ABNORMAL HIGH (ref 0–37)
Albumin: 3.7 g/dL (ref 3.5–5.2)
BUN: 72 mg/dL — ABNORMAL HIGH (ref 6–23)
CO2: 16 meq/L — AB (ref 19–32)
Calcium: 11.2 mg/dL — ABNORMAL HIGH (ref 8.4–10.5)
Chloride: 99 mEq/L (ref 96–112)
Creatinine, Ser: 7.52 mg/dL — ABNORMAL HIGH (ref 0.50–1.35)
GFR calc Af Amer: 9 mL/min — ABNORMAL LOW (ref >90)
GFR, EST NON AFRICAN AMERICAN: 8 mL/min — AB (ref >90)
GLUCOSE: 103 mg/dL — AB (ref 70–99)
Potassium: 4.5 mEq/L (ref 3.7–5.3)
SODIUM: 137 meq/L (ref 137–147)
Total Bilirubin: 0.9 mg/dL (ref 0.3–1.2)
Total Protein: 8.2 g/dL (ref 6.0–8.3)

## 2014-03-18 LAB — PTH, INTACT AND CALCIUM
Calcium, Total (PTH): 11.1 mg/dL — ABNORMAL HIGH (ref 8.4–10.5)
PTH: 9.2 pg/mL — AB (ref 14.0–72.0)

## 2014-03-18 LAB — VITAMIN D 25 HYDROXY (VIT D DEFICIENCY, FRACTURES): Vit D, 25-Hydroxy: 10 ng/mL — ABNORMAL LOW (ref 30–89)

## 2014-03-18 MED ORDER — BIOTENE DRY MOUTH MT LIQD
15.0000 mL | Freq: Two times a day (BID) | OROMUCOSAL | Status: DC
  Administered 2014-03-18 – 2014-03-25 (×12): 15 mL via OROMUCOSAL

## 2014-03-18 NOTE — Consult Note (Signed)
Corinne Reason for consult: Abdominal Pain, cirrhosis, abnormal liver biopsy Referring Physician: [facility]. PCP: Dr. Kenton Kingfisher. Primary G.I.: Dr. Paulita Fujita. Nephrologist: Dr. Konrad Dolores is an 48 y.o. male.  HPI: he was seen by Dr. Paulita Fujita recently for an opinion about his liver disease. He has chronic renal failure and has been evaluated for possible renal transplant. He had imaging studies showing a nodular liver and splenomegaly consistent with cirrhosis. Has a history of heavy alcohol for several years. He also is been a quite obese and over the past several years is lost) hundred pounds with exercise and diet modification. He has had a history of cirrhosis and his family and 2 sisters both of whom were obese. It was felt that he likely had early cirrhosis on the basis of alcohol and possibly NASH. He had labs performed by Dr. Paulita Fujita to get a better handle on his liver disease. These did reveal hemoglobin 10.7 platelet count on 36. Patient has had thrombocytopenia with platelet counts as close 96 in the past. He also was found to have elevated monocytes up to 25% for unclear reasons. Hepatitis B and C. serology were negative. His potassium is been borderline elevated 5.7 M. creatinine has been running about 6.8. INR was normal as were liver tests. It was felt that the most helpful tests would be a liver biopsy. This was performed by IR via transjugular approach on 4/15. The biopsy showed granulomatous hepatitis would marked increase in iron. Stains were negative for acid fast organisms and fungus. There was some periportal fibrosis that the biopsy was not diagnostic of cirrhosis. The patient notes that during the procedure he had right lower quadrant pain which continue to worsen over the next several days despite the use of hydrocodone at home and he was readmitted with worsening pain 4/20. Senses in addition, his creatinine is risen to 7.86 liver tests of  remain basically normal other than elevated total protein. [place] been normal platelet count was low 56. The patient has been treated with Levaquin and Flagyl, protonix, and pain medication. CT scan was obtained and there was no evidence of aperihepatic hemorrhage or abscess. There were multiple lymph nodes or enlarged throughout the abdomen primary in the perihepatic area. There was also marked retroperitoneal lymphadenopathy and concern was for a primary lymphatic tumor. The gallbladder was distended containing sludge and stones with thickening. It was felt that there was no clear evidence of a complication from the box. Due to the patient's renal failure, the procedure was done without contrast. Ultrasound of the gallbladder showed a gallbladder full of sludge marked wall thickening with. Cholecystectomy fluid in a positive Murphy sign consistent with acute cholecystitis.. The patient notes that he was unaware that he had gallstones. His mother, father, and one sister have all had their gallbladder is removed for biliary disease.  Past Medical History  Diagnosis Date  . Anxiety   . HTN (hypertension)   . GERD (gastroesophageal reflux disease)     no longer, since having weight loss  . Headache(784.0)     Migraines, not as bad now  . Anemia   . Complication of anesthesia     bp dropped, difficulty to wake up with 1st knee surgery  . Pneumonia 01-30-14    "bacterial infection in lungs"-none recent  . Chronic kidney disease     [facility]Dr. Abner Greenspan dialysis yet.  . AVF (arteriovenous fistula) 01-30-14    Left arm created 11'14  . Liver  cirrhosis   . Alcohol abuse     quit 2014  . Thrombocytopenia     Past Surgical History  Procedure Laterality Date  . Knee arthroscopy Bilateral   . Av fistula placement Left 10/18/2013    Procedure: ARTERIOVENOUS (AV) FISTULA CREATION; ULTRASOUND GUIDED;  Surgeon: Angelia Mould, MD;  Location: [place];  Service: Vascular;   Laterality: Left;  . Tonsillectomy      child  . Esophagogastroduodenoscopy (egd) with propofol N/A 02/12/2014    Procedure: ESOPHAGOGASTRODUODENOSCOPY (EGD) WITH PROPOFOL;  Surgeon: Arta Silence, MD;  Location: [facility];  Service: Endoscopy;  Laterality: N/A;  . Gastric varices banding N/A 02/12/2014    Procedure: GASTRIC VARICES BANDING;  Surgeon: Arta Silence, MD;  Location: [facility];  Service: Endoscopy;  Laterality: N/A;  possible banding  . Esophagogastroduodenoscopy  01/2014    no gastric or esophageal varies, question gastroparesis  . Liver biopsy  03/12/2014    IR transjugular liver biopsy    Family History  Problem Relation Age of Onset  . Diabetes Mother   . Heart disease Mother     before age 29  . Hyperlipidemia Mother   . Hypertension Mother   . COPD Mother   . Diabetes Father   . Heart disease Father     before age 21  . Hyperlipidemia Father   . Hypertension Father   . Cancer Sister   . Hyperlipidemia Sister   . Hypertension Sister     Social History:  reports that he quit smoking about 17 years ago. His smoking use included Cigarettes. He has a 32 pack-year smoking history. He has never used smokeless tobacco. He reports that he drinks about 4.8 ounces of alcohol per week. He reports that he does not use illicit drugs.  Allergies: No Known Allergies  Medications; Prior to Admission medications   Medication Sig Start Date End Date Taking? Authorizing Provider  ALPRAZolam Duanne Moron) 1 MG tablet Take 1 mg by mouth 2 (two) times daily as needed. For anxiety   Yes Historical Provider, MD  amLODipine (NORVASC) 10 MG tablet Take 5 mg by mouth daily.    Yes Historical Provider, MD  cyclobenzaprine (FLEXERIL) 10 MG tablet Take 10 mg by mouth 3 (three) times daily as needed. For muscle spasms.   Yes Historical Provider, MD  HYDROcodone-acetaminophen (NORCO) 10-325 MG per tablet Take 2 tablets by mouth 2 (two) times daily as needed for severe pain.    Yes  Historical Provider, MD  sevelamer carbonate (RENVELA) 800 MG tablet Take 800 mg by mouth 3 (three) times daily with meals.   Yes Historical Provider, MD  sodium chloride (MURO 128) 2 % ophthalmic solution Place 2 drops into both eyes every 4 (four) hours as needed for irritation.   Yes Historical Provider, MD  vitamin B-12 (CYANOCOBALAMIN) 250 MCG tablet Take 250 mcg by mouth daily.   Yes Historical Provider, MD   . amLODipine  5 mg Oral Daily  . antiseptic oral rinse  15 mL Mouth Rinse BID  . cyanocobalamin  250 mcg Oral Daily  . [START ON 03/19/2014] levofloxacin (LEVAQUIN) IV  500 mg Intravenous Q48H  . metronidazole  500 mg Intravenous Q8H  . pantoprazole (PROTONIX) IV  40 mg Intravenous Q12H  . sevelamer carbonate  800 mg Oral TID WC  . sodium chloride  3 mL Intravenous Q12H   PRN Meds cyclobenzaprine, HYDROmorphone (DILAUDID) injection, polyvinyl alcohol Results for orders placed during the hospital encounter of 03/17/14 (from the past 48  hour(s))  COMPREHENSIVE METABOLIC PANEL     Status: Abnormal   Collection Time    03/17/14  2:30 PM      Result Value Ref Range   Sodium 138  137 - 147 mEq/L   Potassium 4.6  3.7 - 5.3 mEq/L   Chloride 99  96 - 112 mEq/L   CO2 19  19 - 32 mEq/L   Glucose, Bld 116 (*) 70 - 99 mg/dL   BUN 72 (*) 6 - 23 mg/dL   Creatinine, Ser 7.86 (*) 0.50 - 1.35 mg/dL   Calcium 12.2 (*) 8.4 - 10.5 mg/dL   Total Protein 9.1 (*) 6.0 - 8.3 g/dL   Albumin 4.2  3.5 - 5.2 g/dL   AST 21  0 - 37 U/L   ALT 29  0 - 53 U/L   Alkaline Phosphatase 143 (*) 39 - 117 U/L   Total Bilirubin 0.5  0.3 - 1.2 mg/dL   GFR calc non Af Amer 7 (*) >90 mL/min   GFR calc Af Amer 8 (*) >90 mL/min   Comment: (NOTE)     The eGFR has been calculated using the CKD EPI equation.     This calculation has not been validated in all clinical situations.     eGFR's persistently <90 mL/min signify possible Chronic Kidney     Disease.  LIPASE, BLOOD     Status: None   Collection Time     03/17/14  2:30 PM      Result Value Ref Range   Lipase 52  11 - 59 U/L  I-STAT TROPOININ, ED     Status: None   Collection Time    03/17/14  3:11 PM      Result Value Ref Range   Troponin i, poc 0.00  0.00 - 0.08 ng/mL   Comment 3            Comment: Due to the release kinetics of cTnI,     a negative result within the first hours     of the onset of symptoms does not rule out     myocardial infarction with certainty.     If myocardial infarction is still suspected,     repeat the test at appropriate intervals.  CBC WITH DIFFERENTIAL     Status: Abnormal   Collection Time    03/17/14  3:17 PM      Result Value Ref Range   WBC 4.5  4.0 - 10.5 K/uL   RBC 3.25 (*) 4.22 - 5.81 MIL/uL   Hemoglobin 10.5 (*) 13.0 - 17.0 g/dL   HCT 30.0 (*) 39.0 - 52.0 %   MCV 92.3  78.0 - 100.0 fL   MCH 32.3  26.0 - 34.0 pg   MCHC 35.0  30.0 - 36.0 g/dL   RDW 13.3  11.5 - 15.5 %   Platelets 56 (*) 150 - 400 K/uL   Comment: SPECIMEN CHECKED FOR CLOTS     REPEATED TO VERIFY     PLATELET COUNT CONFIRMED BY SMEAR   Neutrophils Relative % 74  43 - 77 %   Lymphocytes Relative 8 (*) 12 - 46 %   Monocytes Relative 13 (*) 3 - 12 %   Eosinophils Relative 4  0 - 5 %   Basophils Relative 1  0 - 1 %   Neutro Abs 3.3  1.7 - 7.7 K/uL   Lymphs Abs 0.4 (*) 0.7 - 4.0 K/uL   Monocytes Absolute 0.6  0.1 -  1.0 K/uL   Eosinophils Absolute 0.2  0.0 - 0.7 K/uL   Basophils Absolute 0.0  0.0 - 0.1 K/uL  PROTIME-INR     Status: None   Collection Time    03/17/14  7:15 PM      Result Value Ref Range   Prothrombin Time 13.1  11.6 - 15.2 seconds   INR 1.01  0.00 - 1.49  VITAMIN D 25 HYDROXY     Status: Abnormal   Collection Time    03/17/14  7:32 PM      Result Value Ref Range   Vit D, 25-Hydroxy 10 (*) 30 - 89 ng/mL   Comment: (NOTE)     Result repeated and verified.     This assay accurately quantifies Vitamin D, which is the sum of the     25-Hydroxy forms of Vitamin D2 and D3.  Studies have shown that the      optimum concentration of 25-Hydroxy Vitamin D is 30 ng/mL or higher.      Concentrations of Vitamin D between 20 and 29 ng/mL are considered to     be insufficient and concentrations less than 20 ng/mL are considered     to be deficient for Vitamin D.     Performed at [facility]  03/17/2014   CLINICAL DATA:  Right side pain, short of breath and decreased appetite since transjugular liver biopsy last Wednesday.  EXAM: CT ABDOMEN AND PELVIS WITHOUT CONTRAST  TECHNIQUE: Multidetector CT imaging of the abdomen and pelvis was performed following the standard protocol without intravenous contrast.  COMPARISON:  Chest x-ray earlier today 03/17/2014; images from transjugular hepatic biopsy 03/12/2014  FINDINGS: Lower Chest: Incompletely imaged nonspecific peribronchovascular ground-glass attenuation opacity in the inferior lingula. No evidence of a pneumothorax or pleural effusion. No suspicious pulmonary mass or nodule. Visualized cardiac structures within normal limits for size. Suggestion of atherosclerotic calcification in the coronary arteries. No pericardial effusion. Unremarkable distal thoracic esophagus. Nonspecific 1 cm paraesophageal lymph node (image 13).  Abdomen: Unremarkable CT appearance of the stomach, duodenum, and left adrenal gland. 2 cm low-attenuation (4.25 HU) right adrenal nodule consistent with a benign adenoma. Normal hepatic contour and morphology. No evidence of perihepatic hemorrhage or subcapsular hematoma. No overtly cirrhotic morphology. The gallbladder is distended and contains high attenuation material consistent with sludge and/ stones. No intra or extrahepatic biliary ductal dilatation. There is evidence of mild gallbladder wall thickening as well as a small amounts of inflammatory stranding. The spleen is enlarged. Unremarkable appearance of the pancreas.  Numerous abnormally enlarged upper abdominal lymph nodes. A 1.7 cm short axis node  is identified in the gastrohepatic ligament (image 25) numerous additional nodes in the porta hepatis and hepatoduodenal ligament. The largest lies posterior to the pancreatic head and measures 5.0 x 2.6 cm. There is interstitial stranding in the region with mild thickening of the right anterior para renal fascia. Numerous retroperitoneal lymph nodes are conspicuous in number if not by size. A left para-aortic node measures 1.3 cm in short axis. No hemo peritoneum.  No evidence of obstruction or focal bowel wall thickening. Normal appendix in the right lower quadrant. The terminal ileum is unremarkable.  Pelvis: Unremarkable bladder, prostate gland and seminal vesicles. No free fluid or suspicious adenopathy.  Bones/Soft Tissues: No acute fracture or aggressive appearing lytic or blastic osseous lesion.  Vascular: Limited evaluation in the absence of intravenous contrast. Atherosclerotic vascular disease without significant stenosis or aneurysmal  dilatation.  IMPRESSION: 1. No evidence of acute complication related to the patient's biopsy procedure last week. Specifically, no pneumothorax, pleural effusion, perihepatic hemorrhage or subcapsular hematoma. 2. Extensive upper abdominal and retroperitoneal adenopathy highly concerning for lymphoma. Metastatic adenopathy is considered less likely. The largest node measures 5 x 2.6 cm just posterior to the pancreatic head. There is associated interstitial stranding in the adjacent fat which is favored to reflect lymphatic congestion. Recommend further evaluation with PET-CT. 3. Mildly distended gallbladder containing sludge and/or small stones. There is mild thickening of the gallbladder wall. Acute or chronic cholecystitis is not excluded. Given the patient's right upper quadrant pain, consider further evaluation with right upper quadrant ultrasound to assess for cholecystitis. 4. Nonspecific and incompletely imaged ground-glass attenuation opacity in the inferior  lingula. Differential considerations include atelectasis and bronchopneumonia. 5. Atherosclerotic vascular calcifications. Suspect coronary artery calcifications in the incompletely imaged heart. 6. Splenomegaly. This may be related to intrinsic liver disease and portal hypertension, or lymphoma. 7. Right adrenal adenoma.   Electronically Signed   By: Jacqulynn Cadet M.D.   On: 03/17/2014 18:02   Dg Chest 2 View  03/17/2014   CLINICAL DATA:  Shortness of breath  EXAM: CHEST  2 VIEW  COMPARISON:  None.  FINDINGS: The cardiomediastinal silhouette is unremarkable.  There is no evidence of focal airspace disease, pulmonary edema, suspicious pulmonary nodule/mass, pleural effusion, or pneumothorax. No acute bony abnormalities are identified.  IMPRESSION: No active cardiopulmonary disease.   Electronically Signed   By: Hassan Rowan M.D.   On: 03/17/2014 15:30   US Abdomen Limited  03/18/2014   CLINICAL DATA:  Abdominal pain  EXAM: US ABDOMEN LIMITED - RIGHT UPPER QUADRANT  COMPARISON:  CT ABD/PELV WO CM dated 03/17/2014; SP TRANSCATHETER BX dated 03/12/2014  FINDINGS: Gallbladder:  The gallbladder contains heterogeneous sludge. There is wall thickening. Positive sonographic Murphy sign. There is also pericholecystic fluid. No definite stones are identified.  Common bile duct:  Diameter: 8 mm in caliber.  Liver:  No focal lesion identified. Within normal limits in parenchymal echogenicity.  IMPRESSION: Findings above are worrisome for a calculus acute cholecystitis. Correlate clinically as for the need for nuclear medicine imaging.  Common bile duct is dilated at 8 mm. Biliary obstruction is not excluded.   Electronically Signed   By: Maryclare Bean M.D.   On: 03/18/2014 00:06               Blood pressure 123/76, pulse 88, temperature 98.3 F (36.8 C), temperature source Oral, resp. rate 18, height '6\' 4"'  (1.93 m), weight 130.727 kg (288 lb 3.2 oz), SpO2 100.00%.  Physical exam:   General-- alert nonicteric  white male he states that he is having right upper quadrant pain Heart-- regular rate and rhythm without murmurs are gallops Lungs--clear Abdomen-- none distended bowel sounds absent. Marked right upper quadrant tenderness to palpation   Assessment: 1. Right Upper Quadrant Pain. CT and ultrasound strongly suggestive of cholecystitis. Patient is on pain medications on antibiotics. He has a strong family history of gallbladder disease and multiple members. The only confusing thing is that he just had this liver biopsy in the pain started at the time of the transjugular liver biopsy. In any event, there's enough radiographic evidence to suggest going ahead and obtained hepatobiliary scan and probable surgical opinion. 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 Patient also has significant alcohol history. No AFB or fungus on smear's. Most common explanation for this would be  sarcoid, PBC, drugs, or possibly Wegner's. A systemic disease could probably explain his lymphadenopathy and chronic renal failure. 3. CKD. Creatinine markedly elevated in patient is being considered for transplant.  Plan: 1. Agree with pain relief and empiric antibiotics. 2. We'll obtained hepatobiliary scan and likely will need surgical opinion. The patient ends up going to surgery for cholecystectomy, will certainly need to obtain better liver biopsies as well as biopsy lymph nodes.   Winfield Cunas. 03/18/2014, 11:09 AM

## 2014-03-18 NOTE — Consult Note (Signed)
I have seen and examined the patient and agree with the assessment and plans.  This is a very difficult situation given both his low platelet count and his adenopathy.  If I am able to do a lap chole, I would not be able to biopsy any of the retroperitoneal lymph nodes.  I would only be able to get to those with open surgery. He may benefit from consulting interventional radiology for an opinion as to whether they can biopsy the nodes. Given the platelet count and adenopathy, he would also benefit from a hem/onc consult. I currently could not palpate any superficial enlarged nodes. Will follow.  Jahari Billy A. Ninfa Linden  MD, FACS

## 2014-03-18 NOTE — Consult Note (Signed)
Steven Best 1966-04-27  [id number].    Requesting MD: Dr. Leonie Douglas  (GI) Chief Complaint/Reason for Consult: RUQ abdominal pain  HPI:  48 y/o white obese male with PMH HTN, HLD and H/o ETOH/tobacco abuse, thrombocytopenia, portal hypertension.  He has ESRD not yet on dialysis and has been evaluated for possible renal transplant at [facility].  Recently had AV fistula in left arm placed.  He had imaging studies showing a nodular liver and splenomegaly consistent with cirrhosis.  Has a history of heavy alcohol for several years but quit in January.  He is obese, but recently lost >100lbs due to exercise and diet modification (no unintentional weight loss).  He has had a history of cirrhosis and his family and in 2 sisters both of whom were obese.  It was felt that he likely had early cirrhosis on the basis of alcohol and possibly NASH.   He was seeing Dr. Paulita Fujita who had recommended a liver biopsy.  This was performed by IR via transjugular approach on 03/12/14. 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 Stains were negative for acid fast organisms and fungus. There was some periportal fibrosis, but the biopsy was not diagnostic of cirrhosis. The patient notes that during the procedure he had right lower quadrant pain which continue to worsen over the next several days despite the use of hydrocodone at home and he was readmitted with worsening pain 03/17/14.  Prior to the procedure he had no abdominal pain or other symptoms.  He was admitted by the medicine service and GI is consulting.  His pain was a 10/10 at home, but is currently a 3/10 with IV pain medication which he is receiving every 2 hours.  He complains of feeling fatigued/malaise, intermittent nausea and vomiting, but none in the last week.   He denies hematemesis, fever, chills, petechiae, jaundice, bleeding gums/nose,bloody stools, easy bruising, rash, changes in bowel/bladder function, jaundice.  He notes anorexia,  but food has never triggered pain like this before.  The pain is a constant achy pain which is intermittently sharp, without radiating pain.  He's never had a hernia or abdominal surgery.    His creatinine has risen to 7.52, alk phos 229 otherwise LFT's normal. WBC normal, but platelet count was low 54.  PT/INR normal.  His PTH is low (9.2) and total calcium is high (11.2).  Non-contrasted CT scan was obtained and there was no evidence of a perihepatic hemorrhage or abscess. There were multiple lymph nodes or enlarged throughout the abdomen primary in the perihepatic area. There was also marked retroperitoneal lymphadenopathy and concern was for a primary lymphatic tumor (lymphoma). The gallbladder was distended containing sludge and stones with thickening. No complications from the biopsy noted on the non-contrasted CT scan. Ultrasound of the gallbladder showed a gallbladder full of sludge marked wall thickening with. Cholecystic fluid, and a positive Murphy sign.  Dr. Oletta Lamas recommended a HIDA scan, but he is unable to undergo a HIDA while on pain medication and he doesn't feel he can stop for 6 hours needed for the test.  He's never had a colonoscopy before.  He had a cardiac stress test and echo which he said were fine as part of his workup for kidney transplant.    CT Abdomen/Pelvis without contrast IMPRESSION:  1. No evidence of acute complication related to the patient's biopsy  procedure last week. Specifically, no pneumothorax, pleural  effusion, perihepatic hemorrhage or subcapsular hematoma.  2. Extensive upper abdominal and retroperitoneal adenopathy  highly  concerning for lymphoma. Metastatic adenopathy is considered less  likely. The largest node measures 5 x 2.6 cm just posterior to the  pancreatic head. There is associated interstitial stranding in the  adjacent fat which is favored to reflect lymphatic congestion.  Recommend further evaluation with PET-CT.  3. Mildly distended  gallbladder containing sludge and/or small  stones. There is mild thickening of the gallbladder wall. Acute or  chronic cholecystitis is not excluded. Given the patient's right  upper quadrant pain, consider further evaluation with right upper  quadrant ultrasound to assess for cholecystitis.  4. Nonspecific and incompletely imaged ground-glass attenuation  opacity in the inferior lingula. Differential considerations include  atelectasis and bronchopneumonia.  5. Atherosclerotic vascular calcifications. Suspect coronary artery  calcifications in the incompletely imaged heart.  6. Splenomegaly. This may be related to intrinsic liver disease and  portal hypertension, or lymphoma.  7. Right adrenal adenoma.   ROS: All systems reviewed and otherwise negative except for as above  Family History  Problem Relation Age of Onset  . Diabetes Mother   . Heart disease Mother     before age 30  . Hyperlipidemia Mother   . Hypertension Mother   . COPD Mother   . Diabetes Father   . Heart disease Father     before age 85  . Hyperlipidemia Father   . Hypertension Father   . Cancer Sister   . Hyperlipidemia Sister   . Hypertension Sister     Past Medical History  Diagnosis Date  . Anxiety   . HTN (hypertension)   . GERD (gastroesophageal reflux disease)     no longer, since having weight loss  . Headache(784.0)     Migraines, not as bad now  . Anemia   . Complication of anesthesia     bp dropped, difficulty to wake up with 1st knee surgery  . Pneumonia 01-30-14    "bacterial infection in lungs"-none recent  . Chronic kidney disease     [facility]Dr. Abner Greenspan dialysis yet.  . AVF (arteriovenous fistula) 01-30-14    Left arm created 11'14  . Liver cirrhosis   . Alcohol abuse     quit 2014  . Thrombocytopenia     Past Surgical History  Procedure Laterality Date  . Knee arthroscopy Bilateral   . Av fistula placement Left 10/18/2013    Procedure: ARTERIOVENOUS  (AV) FISTULA CREATION; ULTRASOUND GUIDED;  Surgeon: Angelia Mould, MD;  Location: [facility];  Service: Vascular;  Laterality: Left;  . Tonsillectomy      child  . Esophagogastroduodenoscopy (egd) with propofol N/A 02/12/2014    Procedure: ESOPHAGOGASTRODUODENOSCOPY (EGD) WITH PROPOFOL;  Surgeon: Arta Silence, MD;  Location: [facility];  Service: Endoscopy;  Laterality: N/A;  . Gastric varices banding N/A 02/12/2014    Procedure: GASTRIC VARICES BANDING;  Surgeon: Arta Silence, MD;  Location: [facility];  Service: Endoscopy;  Laterality: N/A;  possible banding  . Esophagogastroduodenoscopy  01/2014    no gastric or esophageal varies, question gastroparesis  . Liver biopsy  03/12/2014    IR transjugular liver biopsy    Social History:  reports that he quit smoking about 17 years ago. His smoking use included Cigarettes. He has a 32 pack-year smoking history. He has never used smokeless tobacco. He reports that he drinks about 4.8 ounces of alcohol per week. He reports that he does not use illicit drugs.  Allergies: No Known Allergies  Medications Prior to Admission  Medication Sig Dispense Refill  .  ALPRAZolam (XANAX) 1 MG tablet Take 1 mg by mouth 2 (two) times daily as needed. For anxiety      . amLODipine (NORVASC) 10 MG tablet Take 5 mg by mouth daily.       . cyclobenzaprine (FLEXERIL) 10 MG tablet Take 10 mg by mouth 3 (three) times daily as needed. For muscle spasms.      Marland Kitchen HYDROcodone-acetaminophen (NORCO) 10-325 MG per tablet Take 2 tablets by mouth 2 (two) times daily as needed for severe pain.       . sevelamer carbonate (RENVELA) 800 MG tablet Take 800 mg by mouth 3 (three) times daily with meals.      . sodium chloride (MURO 128) 2 % ophthalmic solution Place 2 drops into both eyes every 4 (four) hours as needed for irritation.      . vitamin B-12 (CYANOCOBALAMIN) 250 MCG tablet Take 250 mcg by mouth daily.        Blood pressure 127/80, pulse 90, temperature 98 F (36.7  C), temperature source Oral, resp. rate 20, height 6' 4" (1.93 m), weight 288 lb 3.2 oz (130.727 kg), SpO2 100.00%. Physical Exam: General: pleasant, WD/WN white male who is laying in bed in NAD HEENT: head is normocephalic, atraumatic.  Sclera are noninjected/nonicteric.  PERRL.  Ears and nose without any masses or lesions.  Mouth is pink and moist Heart: regular, rate, and rhythm.  No obvious murmurs, gallops, or rubs noted.  Palpable pedal pulses bilaterally Lungs: CTAB, no wheezes, rhonchi, or rales noted.  Respiratory effort nonlabored Abd: Obese, soft, quite tender in RUQ, +muprhys sign, mild distension, no significant ascites felt, +BS, no masses, hernias, or organomegaly, no rebound or guarding, no abdominal scars noted MS: all 4 extremities are symmetrical with no cyanosis, clubbing, or edema, left arm AV fistula Skin: warm and dry with no masses, lesions, rashes, jaundice, or petechiae/purpura  Psych: A&Ox3 with an appropriate affect.   Results for orders placed during the hospital encounter of 03/17/14 (from the past 48 hour(s))  COMPREHENSIVE METABOLIC PANEL     Status: Abnormal   Collection Time    03/17/14  2:30 PM      Result Value Ref Range   Sodium 138  137 - 147 mEq/L   Potassium 4.6  3.7 - 5.3 mEq/L   Chloride 99  96 - 112 mEq/L   CO2 19  19 - 32 mEq/L   Glucose, Bld 116 (*) 70 - 99 mg/dL   BUN 72 (*) 6 - 23 mg/dL   Creatinine, Ser 7.86 (*) 0.50 - 1.35 mg/dL   Calcium 12.2 (*) 8.4 - 10.5 mg/dL   Total Protein 9.1 (*) 6.0 - 8.3 g/dL   Albumin 4.2  3.5 - 5.2 g/dL   AST 21  0 - 37 U/L   ALT 29  0 - 53 U/L   Alkaline Phosphatase 143 (*) 39 - 117 U/L   Total Bilirubin 0.5  0.3 - 1.2 mg/dL   GFR calc non Af Amer 7 (*) >90 mL/min   GFR calc Af Amer 8 (*) >90 mL/min   Comment: (NOTE)     The eGFR has been calculated using the CKD EPI equation.     This calculation has not been validated in all clinical situations.     eGFR's persistently <90 mL/min signify possible  Chronic Kidney     Disease.  LIPASE, BLOOD     Status: None   Collection Time    03/17/14  2:30 PM  Result Value Ref Range   Lipase 52  11 - 59 U/L  Randolm Idol, ED     Status: None   Collection Time    03/17/14  3:11 PM      Result Value Ref Range   Troponin i, poc 0.00  0.00 - 0.08 ng/mL   Comment 3            Comment: Due to the release kinetics of cTnI,     a negative result within the first hours     of the onset of symptoms does not rule out     myocardial infarction with certainty.     If myocardial infarction is still suspected,     repeat the test at appropriate intervals.  CBC WITH DIFFERENTIAL     Status: Abnormal   Collection Time    03/17/14  3:17 PM      Result Value Ref Range   WBC 4.5  4.0 - 10.5 K/uL   RBC 3.25 (*) 4.22 - 5.81 MIL/uL   Hemoglobin 10.5 (*) 13.0 - 17.0 g/dL   HCT 30.0 (*) 39.0 - 52.0 %   MCV 92.3  78.0 - 100.0 fL   MCH 32.3  26.0 - 34.0 pg   MCHC 35.0  30.0 - 36.0 g/dL   RDW 13.3  11.5 - 15.5 %   Platelets 56 (*) 150 - 400 K/uL   Comment: SPECIMEN CHECKED FOR CLOTS     REPEATED TO VERIFY     PLATELET COUNT CONFIRMED BY SMEAR   Neutrophils Relative % 74  43 - 77 %   Lymphocytes Relative 8 (*) 12 - 46 %   Monocytes Relative 13 (*) 3 - 12 %   Eosinophils Relative 4  0 - 5 %   Basophils Relative 1  0 - 1 %   Neutro Abs 3.3  1.7 - 7.7 K/uL   Lymphs Abs 0.4 (*) 0.7 - 4.0 K/uL   Monocytes Absolute 0.6  0.1 - 1.0 K/uL   Eosinophils Absolute 0.2  0.0 - 0.7 K/uL   Basophils Absolute 0.0  0.0 - 0.1 K/uL  PROTIME-INR     Status: None   Collection Time    03/17/14  7:15 PM      Result Value Ref Range   Prothrombin Time 13.1  11.6 - 15.2 seconds   INR 1.01  0.00 - 1.49  PTH, INTACT AND CALCIUM     Status: Abnormal   Collection Time    03/17/14  7:32 PM      Result Value Ref Range   PTH 9.2 (*) 14.0 - 72.0 pg/mL   Calcium, Total (PTH) 11.1 (*) 8.4 - 10.5 mg/dL   Comment: Performed at [place] D 25 HYDROXY      Status: Abnormal   Collection Time    03/17/14  7:32 PM      Result Value Ref Range   Vit D, 25-Hydroxy 10 (*) 30 - 89 ng/mL   Comment: (NOTE)     Result repeated and verified.     This assay accurately quantifies Vitamin D, which is the sum of the     25-Hydroxy forms of Vitamin D2 and D3.  Studies have shown that the     optimum concentration of 25-Hydroxy Vitamin D is 30 ng/mL or higher.      Concentrations of Vitamin D between 20 and 29 ng/mL are considered to     be insufficient and concentrations less than 20  ng/mL are considered     to be deficient for Vitamin D.     Performed at [place]     Status: Abnormal   Collection Time    03/18/14 11:05 AM      Result Value Ref Range   Sodium 137  137 - 147 mEq/L   Potassium 4.5  3.7 - 5.3 mEq/L   Chloride 99  96 - 112 mEq/L   CO2 16 (*) 19 - 32 mEq/L   Glucose, Bld 103 (*) 70 - 99 mg/dL   BUN 72 (*) 6 - 23 mg/dL   Creatinine, Ser 7.52 (*) 0.50 - 1.35 mg/dL   Calcium 11.2 (*) 8.4 - 10.5 mg/dL   Total Protein 8.2  6.0 - 8.3 g/dL   Albumin 3.7  3.5 - 5.2 g/dL   AST 41 (*) 0 - 37 U/L   ALT 37  0 - 53 U/L   Alkaline Phosphatase 229 (*) 39 - 117 U/L   Total Bilirubin 0.9  0.3 - 1.2 mg/dL   GFR calc non Af Amer 8 (*) >90 mL/min   GFR calc Af Amer 9 (*) >90 mL/min   Comment: (NOTE)     The eGFR has been calculated using the CKD EPI equation.     This calculation has not been validated in all clinical situations.     eGFR's persistently <90 mL/min signify possible Chronic Kidney     Disease.  CBC WITH DIFFERENTIAL     Status: Abnormal   Collection Time    03/18/14 11:05 AM      Result Value Ref Range   WBC 4.8  4.0 - 10.5 K/uL   RBC 2.94 (*) 4.22 - 5.81 MIL/uL   Hemoglobin 9.5 (*) 13.0 - 17.0 g/dL   HCT 27.4 (*) 39.0 - 52.0 %   MCV 93.2  78.0 - 100.0 fL   MCH 32.3  26.0 - 34.0 pg   MCHC 34.7  30.0 - 36.0 g/dL   RDW 13.4  11.5 - 15.5 %   Platelets 55 (*) 150 - 400 K/uL   Comment:  CONSISTENT WITH PREVIOUS RESULT   Neutrophils Relative % 70  43 - 77 %   Neutro Abs 3.3  1.7 - 7.7 K/uL   Lymphocytes Relative 10 (*) 12 - 46 %   Lymphs Abs 0.5 (*) 0.7 - 4.0 K/uL   Monocytes Relative 15 (*) 3 - 12 %   Monocytes Absolute 0.7  0.1 - 1.0 K/uL   Eosinophils Relative 4  0 - 5 %   Eosinophils Absolute 0.2  0.0 - 0.7 K/uL   Basophils Relative 1  0 - 1 %   Basophils Absolute 0.0  0.0 - 0.1 K/uL   Ct Abdomen Pelvis Wo Contrast  03/17/2014   CLINICAL DATA:  Right side pain, short of breath and decreased appetite since transjugular liver biopsy last Wednesday.  EXAM: CT ABDOMEN AND PELVIS WITHOUT CONTRAST  TECHNIQUE: Multidetector CT imaging of the abdomen and pelvis was performed following the standard protocol without intravenous contrast.  COMPARISON:  Chest x-ray earlier today 03/17/2014; images from transjugular hepatic biopsy 03/12/2014  FINDINGS: Lower Chest: Incompletely imaged nonspecific peribronchovascular ground-glass attenuation opacity in the inferior lingula. No evidence of a pneumothorax or pleural effusion. No suspicious pulmonary mass or nodule. Visualized cardiac structures within normal limits for size. Suggestion of atherosclerotic calcification in the coronary arteries. No pericardial effusion. Unremarkable distal thoracic esophagus. Nonspecific 1 cm paraesophageal lymph node (image  13).  Abdomen: Unremarkable CT appearance of the stomach, duodenum, and left adrenal gland. 2 cm low-attenuation (4.25 HU) right adrenal nodule consistent with a benign adenoma. Normal hepatic contour and morphology. No evidence of perihepatic hemorrhage or subcapsular hematoma. No overtly cirrhotic morphology. The gallbladder is distended and contains high attenuation material consistent with sludge and/ stones. No intra or extrahepatic biliary ductal dilatation. There is evidence of mild gallbladder wall thickening as well as a small amounts of inflammatory stranding. The spleen is enlarged.  Unremarkable appearance of the pancreas.  Numerous abnormally enlarged upper abdominal lymph nodes. A 1.7 cm short axis node is identified in the gastrohepatic ligament (image 25) numerous additional nodes in the porta hepatis and hepatoduodenal ligament. The largest lies posterior to the pancreatic head and measures 5.0 x 2.6 cm. There is interstitial stranding in the region with mild thickening of the right anterior para renal fascia. Numerous retroperitoneal lymph nodes are conspicuous in number if not by size. A left para-aortic node measures 1.3 cm in short axis. No hemo peritoneum.  No evidence of obstruction or focal bowel wall thickening. Normal appendix in the right lower quadrant. The terminal ileum is unremarkable.  Pelvis: Unremarkable bladder, prostate gland and seminal vesicles. No free fluid or suspicious adenopathy.  Bones/Soft Tissues: No acute fracture or aggressive appearing lytic or blastic osseous lesion.  Vascular: Limited evaluation in the absence of intravenous contrast. Atherosclerotic vascular disease without significant stenosis or aneurysmal dilatation.  IMPRESSION: 1. No evidence of acute complication related to the patient's biopsy procedure last week. Specifically, no pneumothorax, pleural effusion, perihepatic hemorrhage or subcapsular hematoma. 2. Extensive upper abdominal and retroperitoneal adenopathy highly concerning for lymphoma. Metastatic adenopathy is considered less likely. The largest node measures 5 x 2.6 cm just posterior to the pancreatic head. There is associated interstitial stranding in the adjacent fat which is favored to reflect lymphatic congestion. Recommend further evaluation with PET-CT. 3. Mildly distended gallbladder containing sludge and/or small stones. There is mild thickening of the gallbladder wall. Acute or chronic cholecystitis is not excluded. Given the patient's right upper quadrant pain, consider further evaluation with right upper quadrant  ultrasound to assess for cholecystitis. 4. Nonspecific and incompletely imaged ground-glass attenuation opacity in the inferior lingula. Differential considerations include atelectasis and bronchopneumonia. 5. Atherosclerotic vascular calcifications. Suspect coronary artery calcifications in the incompletely imaged heart. 6. Splenomegaly. This may be related to intrinsic liver disease and portal hypertension, or lymphoma. 7. Right adrenal adenoma.   Electronically Signed   By: Jacqulynn Cadet M.D.   On: 03/17/2014 18:02   Dg Chest 2 View  03/17/2014   CLINICAL DATA:  Shortness of breath  EXAM: CHEST  2 VIEW  COMPARISON:  None.  FINDINGS: The cardiomediastinal silhouette is unremarkable.  There is no evidence of focal airspace disease, pulmonary edema, suspicious pulmonary nodule/mass, pleural effusion, or pneumothorax. No acute bony abnormalities are identified.  IMPRESSION: No active cardiopulmonary disease.   Electronically Signed   By: Hassan Rowan M.D.   On: 03/17/2014 15:30   US Abdomen Limited  03/18/2014   CLINICAL DATA:  Abdominal pain  EXAM: US ABDOMEN LIMITED - RIGHT UPPER QUADRANT  COMPARISON:  CT ABD/PELV WO CM dated 03/17/2014; SP TRANSCATHETER BX dated 03/12/2014  FINDINGS: Gallbladder:  The gallbladder contains heterogeneous sludge. There is wall thickening. Positive sonographic Murphy sign. There is also pericholecystic fluid. No definite stones are identified.  Common bile duct:  Diameter: 8 mm in caliber.  Liver:  No focal lesion identified. Within normal  limits in parenchymal echogenicity.  IMPRESSION: Findings above are worrisome for a calculus acute cholecystitis. Correlate clinically as for the need for nuclear medicine imaging.  Common bile duct is dilated at 8 mm. Biliary obstruction is not excluded.   Electronically Signed   By: Maryclare Bean M.D.   On: 03/18/2014 00:06    MELD score is 20 He scores between a 6-7 on the [employer] which either places him at [place] or  B  Assessment/Plan RUQ abdominal pain S/p transjugular liver biopsy with granulomatous hepatitis with excessive iron ESRD not yet on dialysis Upper abdominal and retroperitoneal lymphadenopathy Splenomegaly Hypercalcemia HCAP Cirrhotic disease with portal hypertension - had EGD on 3/15 Thrombocytopenia Anemia of chronic disease?  Plan: 1.  Dr. Ninfa Linden will evaluate later today 2.  NPO, IVF, pain control, antiemetics, antibiotics (on Levaquin and flagyl) 3.  He has likely 2 options - perc chole tube and IR biopsy of the retroperitoneal LAD -or- Exploratory laparotomy with cholecystectomy and lymph node and/or additional liver biopsy 4.  He is likely a Childs A/B given her current labs.   5.  Would question whether it is appropriate to send him to a tertiary care center like [facility] to have the transplant service/other specialist weigh in on very complex conditions.   Reviewed >25 pages of specialist/provider notes, labs, imaging studies which took >60 minutes to review to obtain an accurate history in this very complex patient  Coralie Keens, [facility] 03/18/2014, 2:09 PM Pager: [phone]

## 2014-03-18 NOTE — Progress Notes (Signed)
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 Denied nausea, fever or chills. In the ED, CT scan of the abdomen showed no active complications from specific biopsy site but did show extensive upper abdominal and retroperitoneal adenopathy concerning for lymphoma. There is also mild distended gallbladder gallbladder thickening.? Lingular pneumonia versus atelectasis. Right adrenal adenoma. Creatinine at 7.6. Baseline seems to be between 5 and 7. Calcium 12.2. Alkaline phosphatase 143. Lipase within normal limits at 52. Platelet count is 56. Hemodynamically stable. Afebrile. Satting 99-100% on room air.   Assessment/Plan:  1. RUQ pain: Possible acute cholecystitis. Continue n.p.o., IV fluids, IV antibiotics (Levaquin and Flagyl) and pain management. GI and surgical consultation appreciated. As per surgery, very difficult situation given low platelet count- if they are able to do laparoscopic cholecystectomy, would not be able to biopsy retroperitoneal lymph nodes which would only be possible with open surgery. Discussed with interventional radiology this morning-lymph nodes not accessible to percutaneous biopsy. Reassess in a.m. Patient unable to perform HIDA scan-unable to be off pain medications long enough to do same. D/W Shadad, Hematology who advised that patient can have surgery with current platelet count and may use when necessary platelet transfusion. 2. Granulomatous hepatitis/cirrhosis with portal hypertension: Diagnosed by  recent liver biopsy. Management per GI-consultation appreciated. Patient has lymphadenopathy on CT abdomen but according to IR cannot be accessed by percutaneous route. Discussed with hematology who will consult in a.m. 3. CKD 5/metabolic acidosis: Nephrology consulted. Not started dialysis yet. Apparently transplant candidate.? Start by mouth/IV bicarbonate 4. Anemia: Multifactorial secondary to ESRD, splenomegaly and chronic disease. Stable. Follow CBC in a.m. 5. Thrombocytopenia: Patient seems to have chronic thrombocytopenia but has dropped from 122 on 4/15 to 56 on 4/20. Stable over the last 24 hours.? Secondary to splenomegaly. Follow CBCs. 6. Intra-abdominal/retroperitoneal lymphadenopathy: As per IR, unable to percutaneous biopsy. 7. Hypercalcemia:? Related to possible lymphoma versus other etiology. Continue IV fluids. 8. Hypertension: Controlled  Code Status: Full Family Communication: Discussed with spouse at bedside Disposition Plan: Remains inpatient   Consultants:  Gastroenterology  General surgery  Nephrology  Procedures:  None  Antibiotics:  IV levofloxacin  IV Flagyl   Subjective: RUQ pain-controlled with pain medications. Asking for food. No dyspnea or chest pain.  Objective: Filed Vitals:   03/18/14 0500 03/18/14 1000 03/18/14 1300 03/18/14 1726  BP: 123/76 131/79 127/80 134/84  Pulse: 88 89 90 100  Temp: 98.3 F (36.8 C) 97.8 F (36.6 C) 98 F (36.7 C) 97.5 F (36.4 C)  TempSrc: Oral Oral Oral Oral  Resp: 18 18 20 20   Height:      Weight:      SpO2: 100% 100% 100% 100%    Intake/Output Summary (Last 24 hours) at 03/18/14 1839 Last data filed at 03/18/14 1300  Gross per 24 hour  Intake      0 ml  Output      0 ml  Net      0 ml   Filed Weights   03/17/14 1201 03/17/14 2140 03/18/14 0400  Weight: 129.275 kg (285 lb) 126.236 kg (278 lb 4.8 oz) 130.727 kg (288 lb  3.2 oz)     Exam:  General exam: Moderately built and obese male lying  comfortably in bed. Respiratory system: Clear. No increased work of breathing. Cardiovascular system: S1 & S2 heard, RRR. No JVD, murmurs, gallops, clicks. Trace bilateral ankle edema. Telemetry: Sinus rhythm. Gastrointestinal system: Abdomen is obese with mild RUQ tenderness without rigidity or rebound. Abdomen soft.. Normal bowel sounds heard. Central nervous system: Alert and oriented. No focal neurological deficits. Extremities: Symmetric 5 x 5 power.   Data Reviewed: Basic Metabolic Panel:  Recent Labs Lab 03/12/14 0845 03/17/14 1430 03/17/14 1932 03/18/14 1105  NA 137 138  --  137  K 4.5 4.6  --  4.5  CL 97 99  --  99  CO2 19 19  --  16*  GLUCOSE 101* 116*  --  103*  BUN 62* 72*  --  72*  CREATININE 7.43* 7.86*  --  7.52*  CALCIUM 10.6* 12.2* 11.1* 11.2*   Liver Function Tests:  Recent Labs Lab 03/17/14 1430 03/18/14 1105  AST 21 41*  ALT 29 37  ALKPHOS 143* 229*  BILITOT 0.5 0.9  PROT 9.1* 8.2  ALBUMIN 4.2 3.7    Recent Labs Lab 03/17/14 1430  LIPASE 52   No results found for this basename: AMMONIA,  in the last 168 hours CBC:  Recent Labs Lab 03/12/14 0845 03/17/14 1517 03/18/14 1105  WBC 2.9* 4.5 4.8  NEUTROABS 2.0 3.3 3.3  HGB 10.4* 10.5* 9.5*  HCT 29.4* 30.0* 27.4*  MCV 92.7 92.3 93.2  PLT 122* 56* 55*   Cardiac Enzymes: No results found for this basename: CKTOTAL, CKMB, CKMBINDEX, TROPONINI,  in the last 168 hours BNP (last 3 results) No results found for this basename: PROBNP,  in the last 8760 hours CBG: No results found for this basename: GLUCAP,  in the last 168 hours  No results found for this or any previous visit (from the past 240 hour(s)).     Studies: Ct Abdomen Pelvis Wo Contrast  03/17/2014   CLINICAL DATA:  Right side pain, short of breath and decreased appetite since transjugular liver biopsy last Wednesday.  EXAM: CT ABDOMEN AND PELVIS WITHOUT CONTRAST  TECHNIQUE: Multidetector CT imaging of the abdomen and pelvis was  performed following the standard protocol without intravenous contrast.  COMPARISON:  Chest x-ray earlier today 03/17/2014; images from transjugular hepatic biopsy 03/12/2014  FINDINGS: Lower Chest: Incompletely imaged nonspecific peribronchovascular ground-glass attenuation opacity in the inferior lingula. No evidence of a pneumothorax or pleural effusion. No suspicious pulmonary mass or nodule. Visualized cardiac structures within normal limits for size. Suggestion of atherosclerotic calcification in the coronary arteries. No pericardial effusion. Unremarkable distal thoracic esophagus. Nonspecific 1 cm paraesophageal lymph node (image 13).  Abdomen: Unremarkable CT appearance of the stomach, duodenum, and left adrenal gland. 2 cm low-attenuation (4.25 HU) right adrenal nodule consistent with a benign adenoma. Normal hepatic contour and morphology. No evidence of perihepatic hemorrhage or subcapsular hematoma. No overtly cirrhotic morphology. The gallbladder is distended and contains high attenuation material consistent with sludge and/ stones. No intra or extrahepatic biliary ductal dilatation. There is evidence of mild gallbladder wall thickening as well as a small amounts of inflammatory stranding. The spleen is enlarged. Unremarkable appearance of the pancreas.  Numerous abnormally enlarged upper abdominal lymph nodes. A 1.7 cm short axis node is identified in the gastrohepatic ligament (image 25) numerous additional nodes in the porta hepatis and hepatoduodenal ligament. The largest lies posterior to the pancreatic head and measures 5.0  x 2.6 cm. There is interstitial stranding in the region with mild thickening of the right anterior para renal fascia. Numerous retroperitoneal lymph nodes are conspicuous in number if not by size. A left para-aortic node measures 1.3 cm in short axis. No hemo peritoneum.  No evidence of obstruction or focal bowel wall thickening. Normal appendix in the right lower quadrant.  The terminal ileum is unremarkable.  Pelvis: Unremarkable bladder, prostate gland and seminal vesicles. No free fluid or suspicious adenopathy.  Bones/Soft Tissues: No acute fracture or aggressive appearing lytic or blastic osseous lesion.  Vascular: Limited evaluation in the absence of intravenous contrast. Atherosclerotic vascular disease without significant stenosis or aneurysmal dilatation.  IMPRESSION: 1. No evidence of acute complication related to the patient's biopsy procedure last week. Specifically, no pneumothorax, pleural effusion, perihepatic hemorrhage or subcapsular hematoma. 2. Extensive upper abdominal and retroperitoneal adenopathy highly concerning for lymphoma. Metastatic adenopathy is considered less likely. The largest node measures 5 x 2.6 cm just posterior to the pancreatic head. There is associated interstitial stranding in the adjacent fat which is favored to reflect lymphatic congestion. Recommend further evaluation with PET-CT. 3. Mildly distended gallbladder containing sludge and/or small stones. There is mild thickening of the gallbladder wall. Acute or chronic cholecystitis is not excluded. Given the patient's right upper quadrant pain, consider further evaluation with right upper quadrant ultrasound to assess for cholecystitis. 4. Nonspecific and incompletely imaged ground-glass attenuation opacity in the inferior lingula. Differential considerations include atelectasis and bronchopneumonia. 5. Atherosclerotic vascular calcifications. Suspect coronary artery calcifications in the incompletely imaged heart. 6. Splenomegaly. This may be related to intrinsic liver disease and portal hypertension, or lymphoma. 7. Right adrenal adenoma.   Electronically Signed   By: Jacqulynn Cadet M.D.   On: 03/17/2014 18:02   Dg Chest 2 View  03/17/2014   CLINICAL DATA:  Shortness of breath  EXAM: CHEST  2 VIEW  COMPARISON:  None.  FINDINGS: The cardiomediastinal silhouette is unremarkable.  There  is no evidence of focal airspace disease, pulmonary edema, suspicious pulmonary nodule/mass, pleural effusion, or pneumothorax. No acute bony abnormalities are identified.  IMPRESSION: No active cardiopulmonary disease.   Electronically Signed   By: Hassan Rowan M.D.   On: 03/17/2014 15:30   US Abdomen Limited  03/18/2014   CLINICAL DATA:  Abdominal pain  EXAM: US ABDOMEN LIMITED - RIGHT UPPER QUADRANT  COMPARISON:  CT ABD/PELV WO CM dated 03/17/2014; SP TRANSCATHETER BX dated 03/12/2014  FINDINGS: Gallbladder:  The gallbladder contains heterogeneous sludge. There is wall thickening. Positive sonographic Murphy sign. There is also pericholecystic fluid. No definite stones are identified.  Common bile duct:  Diameter: 8 mm in caliber.  Liver:  No focal lesion identified. Within normal limits in parenchymal echogenicity.  IMPRESSION: Findings above are worrisome for a calculus acute cholecystitis. Correlate clinically as for the need for nuclear medicine imaging.  Common bile duct is dilated at 8 mm. Biliary obstruction is not excluded.   Electronically Signed   By: Maryclare Bean M.D.   On: 03/18/2014 00:06        Scheduled Meds: . amLODipine  5 mg Oral Daily  . antiseptic oral rinse  15 mL Mouth Rinse BID  . cyanocobalamin  250 mcg Oral Daily  . [START ON 03/19/2014] levofloxacin (LEVAQUIN) IV  500 mg Intravenous Q48H  . metronidazole  500 mg Intravenous Q8H  . pantoprazole (PROTONIX) IV  40 mg Intravenous Q12H  . sevelamer carbonate  800 mg Oral TID WC  . sodium  chloride  3 mL Intravenous Q12H   Continuous Infusions: . sodium chloride 75 mL/hr at 03/18/14 [id number]    Active Problems:   Lymphoma    Time spent: 45 minutes.    Modena Jansky, MD, FACP, [facility]. [facility] Pager [phone]  If 7PM-7AM, please contact night-coverage [email or web address] Password [facility] 03/18/2014, 6:39 PM    LOS: 1 day

## 2014-03-18 NOTE — Progress Notes (Signed)
Received call from [facility] in [place] stating they are unable to proceed with testing d/t administration of pain medication. She states pt is unable to have any form of narcotics for 6 hours prior to testing; pt gets Dilaudid 2mg  Q2H PRN. Nuc Med Dept has moved pt's hepatobiliary test to tomorrow's schedule d/t previously mentioned issues. Pt does not believe he can go 6 hours without pain medication. Writer has alerted Dr. Algis Liming and Dr. Oletta Lamas of situation.

## 2014-03-18 NOTE — Consult Note (Signed)
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 Presented to the hospital with abdominal pain that has been worsening since the biopsy was performed. CT scan of the abdomen done yesterday showed no complications from the biopsy but revealed extensive upper abdominal/retroperitoneal adenopathy raising suspicion. Also noted on radiological and clinical findings, he has evidence of acute cholecystitis for which is currently n.p.o. for cholecystectomy tomorrow.  He admits to poor oral intake for the past 4-5 days following his transjugular liver biopsy due to abdominal pain and some nausea. Denies any vomiting or diarrhea. Denies any chest pain or shortness of breath and reports an episode of right shoulder numbness during the transjugular biopsy. He does not have palpable lymph nodes and may need interventional radiology to perform FNA/lymph node biopsy.   10/14/2013  03/12/2014  03/17/2014  03/18/2014   BUN 63 (H) 62 (H) 72 (H) 72 (H)  Creatinine 5.95 (H) 7.43 (H) 7.86 (H) 7.52 (H)  Calcium 11.7 (H) 10.6 (H) 12.2 (H) 11.2 (H)    Past Medical History  Diagnosis Date  . Anxiety   . HTN (hypertension)   . GERD (gastroesophageal reflux disease)     no longer, since having weight loss  . Headache(784.0)     Migraines, not as bad now  . Anemia   . Complication of anesthesia     bp dropped, difficulty to wake up with 1st knee surgery  . Pneumonia 01-30-14    "bacterial infection in lungs"-none recent  . Chronic kidney disease      [facility]Dr. Abner Greenspan dialysis yet.  . AVF (arteriovenous fistula) 01-30-14    Left arm created 11'14  . Liver cirrhosis   . Alcohol abuse     quit 2014  . Thrombocytopenia     Past Surgical History  Procedure Laterality Date  . Knee arthroscopy Bilateral   . Av fistula placement Left 10/18/2013    Procedure: ARTERIOVENOUS (AV) FISTULA CREATION; ULTRASOUND GUIDED;  Surgeon: Angelia Mould, MD;  Location: [facility];  Service: Vascular;  Laterality: Left;  . Tonsillectomy      child  . Esophagogastroduodenoscopy (egd) with propofol N/A 02/12/2014    Procedure: ESOPHAGOGASTRODUODENOSCOPY (EGD) WITH PROPOFOL;  Surgeon: Arta Silence, MD;  Location: [facility];  Service: Endoscopy;  Laterality: N/A;  . Gastric varices banding N/A 02/12/2014    Procedure: GASTRIC VARICES BANDING;  Surgeon: Arta Silence, MD;  Location: [facility];  Service: Endoscopy;  Laterality: N/A;  possible banding  . Esophagogastroduodenoscopy  01/2014    no gastric or esophageal varies, question gastroparesis  . Liver biopsy  03/12/2014    IR transjugular liver biopsy    Family History  Problem Relation Age of Onset  . Diabetes Mother   . Heart disease Mother     before age 64  . Hyperlipidemia Mother   . Hypertension Mother   . COPD Mother   . Diabetes Father   . Heart disease Father     before age 38  . Hyperlipidemia Father   . Hypertension Father   . Cancer Sister   . Hyperlipidemia Sister   .  Hypertension Sister     Social History:  reports that he quit smoking about 17 years ago. His smoking use included Cigarettes. He has a 32 pack-year smoking history. He has never used smokeless tobacco. He reports that he drinks about 4.8 ounces of alcohol per week. He reports that he does not use illicit drugs.  Allergies: No Known Allergies  Medications:  Scheduled: . amLODipine  5 mg Oral Daily  . antiseptic oral rinse  15 mL Mouth Rinse BID  . cyanocobalamin  250 mcg Oral  Daily  . [START ON 03/19/2014] levofloxacin (LEVAQUIN) IV  500 mg Intravenous Q48H  . metronidazole  500 mg Intravenous Q8H  . pantoprazole (PROTONIX) IV  40 mg Intravenous Q12H  . sevelamer carbonate  800 mg Oral TID WC  . sodium chloride  3 mL Intravenous Q12H    Results for orders placed during the hospital encounter of 03/17/14 (from the past 48 hour(s))  COMPREHENSIVE METABOLIC PANEL     Status: Abnormal   Collection Time    03/17/14  2:30 PM      Result Value Ref Range   Sodium 138  137 - 147 mEq/L   Potassium 4.6  3.7 - 5.3 mEq/L   Chloride 99  96 - 112 mEq/L   CO2 19  19 - 32 mEq/L   Glucose, Bld 116 (*) 70 - 99 mg/dL   BUN 72 (*) 6 - 23 mg/dL   Creatinine, Ser 7.86 (*) 0.50 - 1.35 mg/dL   Calcium 12.2 (*) 8.4 - 10.5 mg/dL   Total Protein 9.1 (*) 6.0 - 8.3 g/dL   Albumin 4.2  3.5 - 5.2 g/dL   AST 21  0 - 37 U/L   ALT 29  0 - 53 U/L   Alkaline Phosphatase 143 (*) 39 - 117 U/L   Total Bilirubin 0.5  0.3 - 1.2 mg/dL   GFR calc non Af Amer 7 (*) >90 mL/min   GFR calc Af Amer 8 (*) >90 mL/min   Comment: (NOTE)     The eGFR has been calculated using the CKD EPI equation.     This calculation has not been validated in all clinical situations.     eGFR's persistently <90 mL/min signify possible Chronic Kidney     Disease.  LIPASE, BLOOD     Status: None   Collection Time    03/17/14  2:30 PM      Result Value Ref Range   Lipase 52  11 - 59 U/L  I-STAT TROPOININ, ED     Status: None   Collection Time    03/17/14  3:11 PM      Result Value Ref Range   Troponin i, poc 0.00  0.00 - 0.08 ng/mL   Comment 3            Comment: Due to the release kinetics of cTnI,     a negative result within the first hours     of the onset of symptoms does not rule out     myocardial infarction with certainty.     If myocardial infarction is still suspected,     repeat the test at appropriate intervals.  CBC WITH DIFFERENTIAL     Status: Abnormal   Collection Time    03/17/14  3:17 PM       Result Value Ref Range   WBC 4.5  4.0 - 10.5 K/uL   RBC 3.25 (*) 4.22 - 5.81 MIL/uL   Hemoglobin 10.5 (*)  13.0 - 17.0 g/dL   HCT 30.0 (*) 39.0 - 52.0 %   MCV 92.3  78.0 - 100.0 fL   MCH 32.3  26.0 - 34.0 pg   MCHC 35.0  30.0 - 36.0 g/dL   RDW 13.3  11.5 - 15.5 %   Platelets 56 (*) 150 - 400 K/uL   Comment: SPECIMEN CHECKED FOR CLOTS     REPEATED TO VERIFY     PLATELET COUNT CONFIRMED BY SMEAR   Neutrophils Relative % 74  43 - 77 %   Lymphocytes Relative 8 (*) 12 - 46 %   Monocytes Relative 13 (*) 3 - 12 %   Eosinophils Relative 4  0 - 5 %   Basophils Relative 1  0 - 1 %   Neutro Abs 3.3  1.7 - 7.7 K/uL   Lymphs Abs 0.4 (*) 0.7 - 4.0 K/uL   Monocytes Absolute 0.6  0.1 - 1.0 K/uL   Eosinophils Absolute 0.2  0.0 - 0.7 K/uL   Basophils Absolute 0.0  0.0 - 0.1 K/uL  PROTIME-INR     Status: None   Collection Time    03/17/14  7:15 PM      Result Value Ref Range   Prothrombin Time 13.1  11.6 - 15.2 seconds   INR 1.01  0.00 - 1.49  PTH, INTACT AND CALCIUM     Status: Abnormal   Collection Time    03/17/14  7:32 PM      Result Value Ref Range   PTH 9.2 (*) 14.0 - 72.0 pg/mL   Calcium, Total (PTH) 11.1 (*) 8.4 - 10.5 mg/dL   Comment: Performed at [facility] HYDROXY     Status: Abnormal   Collection Time    03/17/14  7:32 PM      Result Value Ref Range   Vit D, 25-Hydroxy 10 (*) 30 - 89 ng/mL   Comment: (NOTE)     Result repeated and verified.     This assay accurately quantifies Vitamin D, which is the sum of the     25-Hydroxy forms of Vitamin D2 and D3.  Studies have shown that the     optimum concentration of 25-Hydroxy Vitamin D is 30 ng/mL or higher.      Concentrations of Vitamin D between 20 and 29 ng/mL are considered to     be insufficient and concentrations less than 20 ng/mL are considered     to be deficient for Vitamin D.     Performed at [name]     Status: None   Collection Time    03/18/14 11:00 AM       Result Value Ref Range   Iron 69  42 - 135 ug/dL   TIBC 288  215 - 435 ug/dL   Saturation Ratios 24  20 - 55 %   UIBC 219  125 - 400 ug/dL   Comment: Performed at [facility]     Status: Abnormal   Collection Time    03/18/14 11:05 AM      Result Value Ref Range   Sodium 137  137 - 147 mEq/L   Potassium 4.5  3.7 - 5.3 mEq/L   Chloride 99  96 - 112 mEq/L   CO2 16 (*) 19 - 32 mEq/L   Glucose, Bld 103 (*) 70 - 99 mg/dL   BUN 72 (*) 6 - 23 mg/dL  Creatinine, Ser 7.52 (*) 0.50 - 1.35 mg/dL   Calcium 11.2 (*) 8.4 - 10.5 mg/dL   Total Protein 8.2  6.0 - 8.3 g/dL   Albumin 3.7  3.5 - 5.2 g/dL   AST 41 (*) 0 - 37 U/L   ALT 37  0 - 53 U/L   Alkaline Phosphatase 229 (*) 39 - 117 U/L   Total Bilirubin 0.9  0.3 - 1.2 mg/dL   GFR calc non Af Amer 8 (*) >90 mL/min   GFR calc Af Amer 9 (*) >90 mL/min   Comment: (NOTE)     The eGFR has been calculated using the CKD EPI equation.     This calculation has not been validated in all clinical situations.     eGFR's persistently <90 mL/min signify possible Chronic Kidney     Disease.  CBC WITH DIFFERENTIAL     Status: Abnormal   Collection Time    03/18/14 11:05 AM      Result Value Ref Range   WBC 4.8  4.0 - 10.5 K/uL   RBC 2.94 (*) 4.22 - 5.81 MIL/uL   Hemoglobin 9.5 (*) 13.0 - 17.0 g/dL   HCT 27.4 (*) 39.0 - 52.0 %   MCV 93.2  78.0 - 100.0 fL   MCH 32.3  26.0 - 34.0 pg   MCHC 34.7  30.0 - 36.0 g/dL   RDW 13.4  11.5 - 15.5 %   Platelets 55 (*) 150 - 400 K/uL   Comment: CONSISTENT WITH PREVIOUS RESULT   Neutrophils Relative % 70  43 - 77 %   Neutro Abs 3.3  1.7 - 7.7 K/uL   Lymphocytes Relative 10 (*) 12 - 46 %   Lymphs Abs 0.5 (*) 0.7 - 4.0 K/uL   Monocytes Relative 15 (*) 3 - 12 %   Monocytes Absolute 0.7  0.1 - 1.0 K/uL   Eosinophils Relative 4  0 - 5 %   Eosinophils Absolute 0.2  0.0 - 0.7 K/uL   Basophils Relative 1  0 - 1 %   Basophils Absolute 0.0  0.0 - 0.1 K/uL    Ct Abdomen Pelvis  Wo Contrast  03/17/2014   CLINICAL DATA:  Right side pain, short of breath and decreased appetite since transjugular liver biopsy last Wednesday.  EXAM: CT ABDOMEN AND PELVIS WITHOUT CONTRAST  TECHNIQUE: Multidetector CT imaging of the abdomen and pelvis was performed following the standard protocol without intravenous contrast.  COMPARISON:  Chest x-ray earlier today 03/17/2014; images from transjugular hepatic biopsy 03/12/2014  FINDINGS: Lower Chest: Incompletely imaged nonspecific peribronchovascular ground-glass attenuation opacity in the inferior lingula. No evidence of a pneumothorax or pleural effusion. No suspicious pulmonary mass or nodule. Visualized cardiac structures within normal limits for size. Suggestion of atherosclerotic calcification in the coronary arteries. No pericardial effusion. Unremarkable distal thoracic esophagus. Nonspecific 1 cm paraesophageal lymph node (image 13).  Abdomen: Unremarkable CT appearance of the stomach, duodenum, and left adrenal gland. 2 cm low-attenuation (4.25 HU) right adrenal nodule consistent with a benign adenoma. Normal hepatic contour and morphology. No evidence of perihepatic hemorrhage or subcapsular hematoma. No overtly cirrhotic morphology. The gallbladder is distended and contains high attenuation material consistent with sludge and/ stones. No intra or extrahepatic biliary ductal dilatation. There is evidence of mild gallbladder wall thickening as well as a small amounts of inflammatory stranding. The spleen is enlarged. Unremarkable appearance of the pancreas.  Numerous abnormally enlarged upper abdominal lymph nodes. A 1.7 cm short axis node is identified  in the gastrohepatic ligament (image 25) numerous additional nodes in the porta hepatis and hepatoduodenal ligament. The largest lies posterior to the pancreatic head and measures 5.0 x 2.6 cm. There is interstitial stranding in the region with mild thickening of the right anterior para renal fascia.  Numerous retroperitoneal lymph nodes are conspicuous in number if not by size. A left para-aortic node measures 1.3 cm in short axis. No hemo peritoneum.  No evidence of obstruction or focal bowel wall thickening. Normal appendix in the right lower quadrant. The terminal ileum is unremarkable.  Pelvis: Unremarkable bladder, prostate gland and seminal vesicles. No free fluid or suspicious adenopathy.  Bones/Soft Tissues: No acute fracture or aggressive appearing lytic or blastic osseous lesion.  Vascular: Limited evaluation in the absence of intravenous contrast. Atherosclerotic vascular disease without significant stenosis or aneurysmal dilatation.  IMPRESSION: 1. No evidence of acute complication related to the patient's biopsy procedure last week. Specifically, no pneumothorax, pleural effusion, perihepatic hemorrhage or subcapsular hematoma. 2. Extensive upper abdominal and retroperitoneal adenopathy highly concerning for lymphoma. Metastatic adenopathy is considered less likely. The largest node measures 5 x 2.6 cm just posterior to the pancreatic head. There is associated interstitial stranding in the adjacent fat which is favored to reflect lymphatic congestion. Recommend further evaluation with PET-CT. 3. Mildly distended gallbladder containing sludge and/or small stones. There is mild thickening of the gallbladder wall. Acute or chronic cholecystitis is not excluded. Given the patient's right upper quadrant pain, consider further evaluation with right upper quadrant ultrasound to assess for cholecystitis. 4. Nonspecific and incompletely imaged ground-glass attenuation opacity in the inferior lingula. Differential considerations include atelectasis and bronchopneumonia. 5. Atherosclerotic vascular calcifications. Suspect coronary artery calcifications in the incompletely imaged heart. 6. Splenomegaly. This may be related to intrinsic liver disease and portal hypertension, or lymphoma. 7. Right adrenal  adenoma.   Electronically Signed   By: Jacqulynn Cadet M.D.   On: 03/17/2014 18:02   Dg Chest 2 View  03/17/2014   CLINICAL DATA:  Shortness of breath  EXAM: CHEST  2 VIEW  COMPARISON:  None.  FINDINGS: The cardiomediastinal silhouette is unremarkable.  There is no evidence of focal airspace disease, pulmonary edema, suspicious pulmonary nodule/mass, pleural effusion, or pneumothorax. No acute bony abnormalities are identified.  IMPRESSION: No active cardiopulmonary disease.   Electronically Signed   By: Hassan Rowan M.D.   On: 03/17/2014 15:30   US Abdomen Limited  03/18/2014   CLINICAL DATA:  Abdominal pain  EXAM: US ABDOMEN LIMITED - RIGHT UPPER QUADRANT  COMPARISON:  CT ABD/PELV WO CM dated 03/17/2014; SP TRANSCATHETER BX dated 03/12/2014  FINDINGS: Gallbladder:  The gallbladder contains heterogeneous sludge. There is wall thickening. Positive sonographic Murphy sign. There is also pericholecystic fluid. No definite stones are identified.  Common bile duct:  Diameter: 8 mm in caliber.  Liver:  No focal lesion identified. Within normal limits in parenchymal echogenicity.  IMPRESSION: Findings above are worrisome for a calculus acute cholecystitis. Correlate clinically as for the need for nuclear medicine imaging.  Common bile duct is dilated at 8 mm. Biliary obstruction is not excluded.   Electronically Signed   By: Maryclare Bean M.D.   On: 03/18/2014 00:06    Review of Systems  Constitutional: Positive for malaise/fatigue. Negative for fever, chills and diaphoresis.  HENT: Negative.   Eyes: Negative.   Respiratory: Negative.   Cardiovascular: Negative.   Gastrointestinal: Positive for nausea and abdominal pain. Negative for heartburn, vomiting, diarrhea, constipation and blood in stool.  Genitourinary: Negative.   Musculoskeletal: Positive for joint pain. Negative for back pain, falls, myalgias and neck pain.       Hip pain right greater than left  Skin: Negative.   Neurological: Negative.  Negative  for weakness.  Endo/Heme/Allergies: Negative.   Psychiatric/Behavioral: The patient is nervous/anxious.   All other systems reviewed and are negative.  Blood pressure 134/84, pulse 100, temperature 97.5 F (36.4 C), temperature source Oral, resp. rate 20, height '6\' 4"'  (1.93 m), weight 130.727 kg (288 lb 3.2 oz), SpO2 100.00%. Physical Exam  Nursing note and vitals reviewed. Constitutional: He is oriented to person, place, and time. He appears well-developed and well-nourished.  Currently appears comfortable   HENT:  Head: Normocephalic and atraumatic.  Left Ear: External ear normal.  Mouth/Throat: No oropharyngeal exudate.  Eyes: EOM are normal. Pupils are equal, round, and reactive to light. No scleral icterus.  Neck: Normal range of motion. Neck supple. No JVD present. No tracheal deviation present. No thyromegaly present.  Cardiovascular: Normal rate, regular rhythm and normal heart sounds.  Exam reveals no friction rub.   No murmur heard. Respiratory: Breath sounds normal. No respiratory distress. He has no wheezes. He has no rales.  Decreased inspiratory effort  GI: Soft. He exhibits distension. He exhibits no mass. There is tenderness. There is no rebound and no guarding.  Positive Murphy's sign  Musculoskeletal: Normal range of motion. He exhibits no edema and no tenderness.  Lymphadenopathy:    He has no cervical adenopathy.  Neurological: He is alert and oriented to person, place, and time. No cranial nerve deficit. Coordination normal.  Skin: Skin is warm and dry. No rash noted. No erythema.  Psychiatric: He has a normal mood and affect. His behavior is normal.    Assessment/Plan: 1. Acute renal failure on chronic kidney disease stage V: Appears to have been possibly hemodynamically mediated with poor oral intake and possibly third space losses with acute cholecystitis. Agree with gentle intravenous fluid particularly if n.p.o. for upcoming cholecystectomy. Clinically, he  does not have acute indications for dialysis and labs appear to be permissive for medical management. He does have a functioning left radiocephalic fistula in situ that is mature and suitable for cannulation if needed for dialysis. 2. Acute abdominal pain-likely secondary to acute cholecystitis, plans noted for laparoscopic cholecystectomy tomorrow (caution being exercised due to his thrombocytopenia). 3. Hypercalcemia: Possibly hypercalcemia of malignancy now with the discovery of intra-abdominal adenopathy suggestive of lymphoma. Appropriately suppressed PTH level noted weighs against primary hyperparathyroidism. Will send for a PTH-related peptide and 1 25-hydroxy vitamin D level. May need surveillance bone series for lytic lesions. Will need to evaluate him for possible calcitonin therapy 4. Cirrhosis, thrombocytopenia, splenomegaly: Supportive management at this time per gastroenterology.  5. Intra-abdominal/retroperitoneal lymphadenopathy: Hematology/oncology consultation pending, will likely need lymph node biopsy to ascertain diagnosis  Clayborne Dana. Kemiah Booz 03/18/2014, 6:32 PM

## 2014-03-18 NOTE — Progress Notes (Signed)
New Admission Note:   Arrival: Via ED Mental Orientation: A&Ox4 Telemetry: ordered and placed, box 16 Assessment:  See doc flowsheet Skin: unremarkable, intact IV: right hand IV, clean and dry, not infusing Pain: 7/10 pain in right flank area Safety Measures:  Call bell placed within reach; patient instructed on use of call bell and verbalized understanding. Bed in lowest position.  Non-skid socks on.   [address] [place] Orientation: Patient oriented to staff, unit, and room. Family: Wife and son at bedside.  Orders have been reviewed and implemented. Will continue to monitor.  Arlyss Queen, RN, BSN

## 2014-03-19 DIAGNOSIS — R109 Unspecified abdominal pain: Secondary | ICD-10-CM

## 2014-03-19 DIAGNOSIS — R161 Splenomegaly, not elsewhere classified: Secondary | ICD-10-CM

## 2014-03-19 DIAGNOSIS — D696 Thrombocytopenia, unspecified: Secondary | ICD-10-CM

## 2014-03-19 DIAGNOSIS — K769 Liver disease, unspecified: Secondary | ICD-10-CM

## 2014-03-19 DIAGNOSIS — R599 Enlarged lymph nodes, unspecified: Secondary | ICD-10-CM

## 2014-03-19 LAB — CBC WITH DIFFERENTIAL/PLATELET
Basophils Absolute: 0 10*3/uL (ref 0.0–0.1)
Basophils Relative: 1 % (ref 0–1)
EOS PCT: 4 % (ref 0–5)
Eosinophils Absolute: 0.2 10*3/uL (ref 0.0–0.7)
HEMATOCRIT: 26.5 % — AB (ref 39.0–52.0)
Hemoglobin: 9 g/dL — ABNORMAL LOW (ref 13.0–17.0)
LYMPHS ABS: 0.5 10*3/uL — AB (ref 0.7–4.0)
LYMPHS PCT: 14 % (ref 12–46)
MCH: 32 pg (ref 26.0–34.0)
MCHC: 34 g/dL (ref 30.0–36.0)
MCV: 94.3 fL (ref 78.0–100.0)
MONOS PCT: 11 % (ref 3–12)
Monocytes Absolute: 0.4 10*3/uL (ref 0.1–1.0)
Neutro Abs: 2.6 10*3/uL (ref 1.7–7.7)
Neutrophils Relative %: 70 % (ref 43–77)
Platelets: 53 10*3/uL — ABNORMAL LOW (ref 150–400)
RBC: 2.81 MIL/uL — ABNORMAL LOW (ref 4.22–5.81)
RDW: 13.5 % (ref 11.5–15.5)
WBC: 3.7 10*3/uL — AB (ref 4.0–10.5)

## 2014-03-19 LAB — COMPREHENSIVE METABOLIC PANEL
ALBUMIN: 3.7 g/dL (ref 3.5–5.2)
ALK PHOS: 195 U/L — AB (ref 39–117)
ALT: 31 U/L (ref 0–53)
AST: 29 U/L (ref 0–37)
BUN: 76 mg/dL — ABNORMAL HIGH (ref 6–23)
CO2: 17 mEq/L — ABNORMAL LOW (ref 19–32)
CREATININE: 7.85 mg/dL — AB (ref 0.50–1.35)
Calcium: 11 mg/dL — ABNORMAL HIGH (ref 8.4–10.5)
Chloride: 103 mEq/L (ref 96–112)
GFR calc Af Amer: 8 mL/min — ABNORMAL LOW (ref >90)
GFR calc non Af Amer: 7 mL/min — ABNORMAL LOW (ref >90)
Glucose, Bld: 97 mg/dL (ref 70–99)
POTASSIUM: 4.8 meq/L (ref 3.7–5.3)
Sodium: 140 mEq/L (ref 137–147)
Total Bilirubin: 0.5 mg/dL (ref 0.3–1.2)
Total Protein: 8.1 g/dL (ref 6.0–8.3)

## 2014-03-19 LAB — PHOSPHORUS: Phosphorus: 6.7 mg/dL — ABNORMAL HIGH (ref 2.3–4.6)

## 2014-03-19 LAB — ABO/RH: ABO/RH(D): O NEG

## 2014-03-19 LAB — ANA: Anti Nuclear Antibody(ANA): NEGATIVE

## 2014-03-19 LAB — VITAMIN D 25 HYDROXY (VIT D DEFICIENCY, FRACTURES)

## 2014-03-19 LAB — ANGIOTENSIN CONVERTING ENZYME: Angiotensin-Converting Enzyme: 98 U/L — ABNORMAL HIGH (ref 8–52)

## 2014-03-19 LAB — MAGNESIUM: Magnesium: 2.5 mg/dL (ref 1.5–2.5)

## 2014-03-19 MED ORDER — CALCITONIN (SALMON) 200 UNIT/ACT NA SOLN
1.0000 | Freq: Every day | NASAL | Status: DC
  Administered 2014-03-19 – 2014-03-25 (×7): 1 via NASAL
  Filled 2014-03-19: qty 3.7

## 2014-03-19 NOTE — Progress Notes (Signed)
Patient ID: Steven Best, male   DOB: Dec 31, 1965, 48 y.o.   MRN: [id number]   Subjective: C/o RUQ pain.  No n/v.    Objective:  Vital signs:  Filed Vitals:   03/18/14 1726 03/18/14 2134 03/19/14 0406 03/19/14 0614  BP: 134/84 134/84  133/82  Pulse: 100 87  81  Temp: 97.5 F (36.4 C) 98.3 F (36.8 C)  97.9 F (36.6 C)  TempSrc: Oral Oral  Oral  Resp: _0 Height:  _1  (1.93 m)    Weight:  288 lb 4 oz (130.75 kg) 289 lb 9.6 oz (131.362 kg)   SpO2: 100% 100%  100%    Last BM Date: 03/13/14  Intake/Output   Yesterday:    This shift:     Physical Exam: General: Pt awake/alert/oriented x4 in no acute distress Abdomen: Soft.  Nondistended.  TTP to RUQ.  No evidence of peritonitis.  No incarcerated hernias.   Problem List:   Active Problems:   Lymphoma   Cholecystitis, acute   Chronic kidney disease, stage V   Anemia   Thrombocytopenia, unspecified   Abdominal lymphadenopathy    Results:   Labs: Results for orders placed during the hospital encounter of 03/17/14 (from the past 48 hour(s))  COMPREHENSIVE METABOLIC PANEL     Status: Abnormal   Collection Time    03/17/14  2:30 PM      Result Value Ref Range   Sodium 138  137 - 147 mEq/L   Potassium 4.6  3.7 - 5.3 mEq/L   Chloride 99  96 - 112 mEq/L   CO2 19  19 - 32 mEq/L   Glucose, Bld 116 (*) 70 - 99 mg/dL   BUN 72 (*) 6 - 23 mg/dL   Creatinine, Ser 7.86 (*) 0.50 - 1.35 mg/dL   Calcium 12.2 (*) 8.4 - 10.5 mg/dL   Total Protein 9.1 (*) 6.0 - 8.3 g/dL   Albumin 4.2  3.5 - 5.2 g/dL   AST 21  0 - 37 U/L   ALT 29  0 - 53 U/L   Alkaline Phosphatase 143 (*) 39 - 117 U/L   Total Bilirubin 0.5  0.3 - 1.2 mg/dL   GFR calc non Af Amer 7 (*) >90 mL/min   GFR calc Af Amer 8 (*) >90 mL/min   Comment: (NOTE)     The eGFR has been calculated using the CKD EPI equation.     This calculation has not been validated in all clinical situations.     eGFR's persistently <90 mL/min signify possible Chronic  Kidney     Disease.  LIPASE, BLOOD     Status: None   Collection Time    03/17/14  2:30 PM      Result Value Ref Range   Lipase 52  11 - 59 U/L  I-STAT TROPOININ, ED     Status: None   Collection Time    03/17/14  3:11 PM      Result Value Ref Range   Troponin i, poc 0.00  0.00 - 0.08 ng/mL   Comment 3            Comment: Due to the release kinetics of cTnI,     a negative result within the first hours     of the onset of symptoms does not rule out     myocardial infarction with certainty.     If myocardial infarction is still suspected,     repeat the  test at appropriate intervals.  CBC WITH DIFFERENTIAL     Status: Abnormal   Collection Time    03/17/14  3:17 PM      Result Value Ref Range   WBC 4.5  4.0 - 10.5 K/uL   RBC 3.25 (*) 4.22 - 5.81 MIL/uL   Hemoglobin 10.5 (*) 13.0 - 17.0 g/dL   HCT 30.0 (*) 39.0 - 52.0 %   MCV 92.3  78.0 - 100.0 fL   MCH 32.3  26.0 - 34.0 pg   MCHC 35.0  30.0 - 36.0 g/dL   RDW 13.3  11.5 - 15.5 %   Platelets 56 (*) 150 - 400 K/uL   Comment: SPECIMEN CHECKED FOR CLOTS     REPEATED TO VERIFY     PLATELET COUNT CONFIRMED BY SMEAR   Neutrophils Relative % 74  43 - 77 %   Lymphocytes Relative 8 (*) 12 - 46 %   Monocytes Relative 13 (*) 3 - 12 %   Eosinophils Relative 4  0 - 5 %   Basophils Relative 1  0 - 1 %   Neutro Abs 3.3  1.7 - 7.7 K/uL   Lymphs Abs 0.4 (*) 0.7 - 4.0 K/uL   Monocytes Absolute 0.6  0.1 - 1.0 K/uL   Eosinophils Absolute 0.2  0.0 - 0.7 K/uL   Basophils Absolute 0.0  0.0 - 0.1 K/uL  PROTIME-INR     Status: None   Collection Time    03/17/14  7:15 PM      Result Value Ref Range   Prothrombin Time 13.1  11.6 - 15.2 seconds   INR 1.01  0.00 - 1.49  PTH, INTACT AND CALCIUM     Status: Abnormal   Collection Time    03/17/14  7:32 PM      Result Value Ref Range   PTH 9.2 (*) 14.0 - 72.0 pg/mL   Calcium, Total (PTH) 11.1 (*) 8.4 - 10.5 mg/dL   Comment: Performed at [facility] D 25 HYDROXY     Status:  Abnormal   Collection Time    03/17/14  7:32 PM      Result Value Ref Range   Vit D, 25-Hydroxy 10 (*) 30 - 89 ng/mL   Comment: (NOTE)     Result repeated and verified.     This assay accurately quantifies Vitamin D, which is the sum of the     25-Hydroxy forms of Vitamin D2 and D3.  Studies have shown that the     optimum concentration of 25-Hydroxy Vitamin D is 30 ng/mL or higher.      Concentrations of Vitamin D between 20 and 29 ng/mL are considered to     be insufficient and concentrations less than 20 ng/mL are considered     to be deficient for Vitamin D.     Performed at [facility] TIBC     Status: None   Collection Time    03/18/14 11:00 AM      Result Value Ref Range   Iron 69  42 - 135 ug/dL   TIBC 288  215 - 435 ug/dL   Saturation Ratios 24  20 - 55 %   UIBC 219  125 - 400 ug/dL   Comment: Performed at [place]     Status: Abnormal   Collection Time    03/18/14 11:05 AM      Result Value Ref Range  Sodium 137  137 - 147 mEq/L   Potassium 4.5  3.7 - 5.3 mEq/L   Chloride 99  96 - 112 mEq/L   CO2 16 (*) 19 - 32 mEq/L   Glucose, Bld 103 (*) 70 - 99 mg/dL   BUN 72 (*) 6 - 23 mg/dL   Creatinine, Ser 7.52 (*) 0.50 - 1.35 mg/dL   Calcium 11.2 (*) 8.4 - 10.5 mg/dL   Total Protein 8.2  6.0 - 8.3 g/dL   Albumin 3.7  3.5 - 5.2 g/dL   AST 41 (*) 0 - 37 U/L   ALT 37  0 - 53 U/L   Alkaline Phosphatase 229 (*) 39 - 117 U/L   Total Bilirubin 0.9  0.3 - 1.2 mg/dL   GFR calc non Af Amer 8 (*) >90 mL/min   GFR calc Af Amer 9 (*) >90 mL/min   Comment: (NOTE)     The eGFR has been calculated using the CKD EPI equation.     This calculation has not been validated in all clinical situations.     eGFR's persistently <90 mL/min signify possible Chronic Kidney     Disease.  CBC WITH DIFFERENTIAL     Status: Abnormal   Collection Time    03/18/14 11:05 AM      Result Value Ref Range   WBC 4.8  4.0 - 10.5 K/uL   RBC  2.94 (*) 4.22 - 5.81 MIL/uL   Hemoglobin 9.5 (*) 13.0 - 17.0 g/dL   HCT 27.4 (*) 39.0 - 52.0 %   MCV 93.2  78.0 - 100.0 fL   MCH 32.3  26.0 - 34.0 pg   MCHC 34.7  30.0 - 36.0 g/dL   RDW 13.4  11.5 - 15.5 %   Platelets 55 (*) 150 - 400 K/uL   Comment: CONSISTENT WITH PREVIOUS RESULT   Neutrophils Relative % 70  43 - 77 %   Neutro Abs 3.3  1.7 - 7.7 K/uL   Lymphocytes Relative 10 (*) 12 - 46 %   Lymphs Abs 0.5 (*) 0.7 - 4.0 K/uL   Monocytes Relative 15 (*) 3 - 12 %   Monocytes Absolute 0.7  0.1 - 1.0 K/uL   Eosinophils Relative 4  0 - 5 %   Eosinophils Absolute 0.2  0.0 - 0.7 K/uL   Basophils Relative 1  0 - 1 %   Basophils Absolute 0.0  0.0 - 0.1 K/uL  COMPREHENSIVE METABOLIC PANEL     Status: Abnormal   Collection Time    03/19/14  7:11 AM      Result Value Ref Range   Sodium 140  137 - 147 mEq/L   Potassium 4.8  3.7 - 5.3 mEq/L   Chloride 103  96 - 112 mEq/L   CO2 17 (*) 19 - 32 mEq/L   Glucose, Bld 97  70 - 99 mg/dL   BUN 76 (*) 6 - 23 mg/dL   Creatinine, Ser 7.85 (*) 0.50 - 1.35 mg/dL   Calcium 11.0 (*) 8.4 - 10.5 mg/dL   Total Protein 8.1  6.0 - 8.3 g/dL   Albumin 3.7  3.5 - 5.2 g/dL   AST 29  0 - 37 U/L   ALT 31  0 - 53 U/L   Alkaline Phosphatase 195 (*) 39 - 117 U/L   Total Bilirubin 0.5  0.3 - 1.2 mg/dL   GFR calc non Af Amer 7 (*) >90 mL/min   GFR calc Af Amer 8 (*) >90 mL/min  Comment: (NOTE)     The eGFR has been calculated using the CKD EPI equation.     This calculation has not been validated in all clinical situations.     eGFR's persistently <90 mL/min signify possible Chronic Kidney     Disease.  CBC WITH DIFFERENTIAL     Status: Abnormal   Collection Time    03/19/14  7:11 AM      Result Value Ref Range   WBC 3.7 (*) 4.0 - 10.5 K/uL   RBC 2.81 (*) 4.22 - 5.81 MIL/uL   Hemoglobin 9.0 (*) 13.0 - 17.0 g/dL   HCT 26.5 (*) 39.0 - 52.0 %   MCV 94.3  78.0 - 100.0 fL   MCH 32.0  26.0 - 34.0 pg   MCHC 34.0  30.0 - 36.0 g/dL   RDW 13.5  11.5 - 15.5 %    Platelets 53 (*) 150 - 400 K/uL   Comment: CONSISTENT WITH PREVIOUS RESULT   Neutrophils Relative % 70  43 - 77 %   Neutro Abs 2.6  1.7 - 7.7 K/uL   Lymphocytes Relative 14  12 - 46 %   Lymphs Abs 0.5 (*) 0.7 - 4.0 K/uL   Monocytes Relative 11  3 - 12 %   Monocytes Absolute 0.4  0.1 - 1.0 K/uL   Eosinophils Relative 4  0 - 5 %   Eosinophils Absolute 0.2  0.0 - 0.7 K/uL   Basophils Relative 1  0 - 1 %   Basophils Absolute 0.0  0.0 - 0.1 K/uL  PHOSPHORUS     Status: Abnormal   Collection Time    03/19/14  7:11 AM      Result Value Ref Range   Phosphorus 6.7 (*) 2.3 - 4.6 mg/dL  MAGNESIUM     Status: None   Collection Time    03/19/14  7:11 AM      Result Value Ref Range   Magnesium 2.5  1.5 - 2.5 mg/dL    Imaging / Studies: Ct Abdomen Pelvis Wo Contrast  03/17/2014   CLINICAL DATA:  Right side pain, short of breath and decreased appetite since transjugular liver biopsy last Wednesday.  EXAM: CT ABDOMEN AND PELVIS WITHOUT CONTRAST  TECHNIQUE: Multidetector CT imaging of the abdomen and pelvis was performed following the standard protocol without intravenous contrast.  COMPARISON:  Chest x-ray earlier today 03/17/2014; images from transjugular hepatic biopsy 03/12/2014  FINDINGS: Lower Chest: Incompletely imaged nonspecific peribronchovascular ground-glass attenuation opacity in the inferior lingula. No evidence of a pneumothorax or pleural effusion. No suspicious pulmonary mass or nodule. Visualized cardiac structures within normal limits for size. Suggestion of atherosclerotic calcification in the coronary arteries. No pericardial effusion. Unremarkable distal thoracic esophagus. Nonspecific 1 cm paraesophageal lymph node (image 13).  Abdomen: Unremarkable CT appearance of the stomach, duodenum, and left adrenal gland. 2 cm low-attenuation (4.25 HU) right adrenal nodule consistent with a benign adenoma. Normal hepatic contour and morphology. No evidence of perihepatic hemorrhage or  subcapsular hematoma. No overtly cirrhotic morphology. The gallbladder is distended and contains high attenuation material consistent with sludge and/ stones. No intra or extrahepatic biliary ductal dilatation. There is evidence of mild gallbladder wall thickening as well as a small amounts of inflammatory stranding. The spleen is enlarged. Unremarkable appearance of the pancreas.  Numerous abnormally enlarged upper abdominal lymph nodes. A 1.7 cm short axis node is identified in the gastrohepatic ligament (image 25) numerous additional nodes in the porta hepatis and hepatoduodenal ligament. The largest  lies posterior to the pancreatic head and measures 5.0 x 2.6 cm. There is interstitial stranding in the region with mild thickening of the right anterior para renal fascia. Numerous retroperitoneal lymph nodes are conspicuous in number if not by size. A left para-aortic node measures 1.3 cm in short axis. No hemo peritoneum.  No evidence of obstruction or focal bowel wall thickening. Normal appendix in the right lower quadrant. The terminal ileum is unremarkable.  Pelvis: Unremarkable bladder, prostate gland and seminal vesicles. No free fluid or suspicious adenopathy.  Bones/Soft Tissues: No acute fracture or aggressive appearing lytic or blastic osseous lesion.  Vascular: Limited evaluation in the absence of intravenous contrast. Atherosclerotic vascular disease without significant stenosis or aneurysmal dilatation.  IMPRESSION: 1. No evidence of acute complication related to the patient's biopsy procedure last week. Specifically, no pneumothorax, pleural effusion, perihepatic hemorrhage or subcapsular hematoma. 2. Extensive upper abdominal and retroperitoneal adenopathy highly concerning for lymphoma. Metastatic adenopathy is considered less likely. The largest node measures 5 x 2.6 cm just posterior to the pancreatic head. There is associated interstitial stranding in the adjacent fat which is favored to reflect  lymphatic congestion. Recommend further evaluation with PET-CT. 3. Mildly distended gallbladder containing sludge and/or small stones. There is mild thickening of the gallbladder wall. Acute or chronic cholecystitis is not excluded. Given the patient's right upper quadrant pain, consider further evaluation with right upper quadrant ultrasound to assess for cholecystitis. 4. Nonspecific and incompletely imaged ground-glass attenuation opacity in the inferior lingula. Differential considerations include atelectasis and bronchopneumonia. 5. Atherosclerotic vascular calcifications. Suspect coronary artery calcifications in the incompletely imaged heart. 6. Splenomegaly. This may be related to intrinsic liver disease and portal hypertension, or lymphoma. 7. Right adrenal adenoma.   Electronically Signed   By: Jacqulynn Cadet M.D.   On: 03/17/2014 18:02   Dg Chest 2 View  03/17/2014   CLINICAL DATA:  Shortness of breath  EXAM: CHEST  2 VIEW  COMPARISON:  None.  FINDINGS: The cardiomediastinal silhouette is unremarkable.  There is no evidence of focal airspace disease, pulmonary edema, suspicious pulmonary nodule/mass, pleural effusion, or pneumothorax. No acute bony abnormalities are identified.  IMPRESSION: No active cardiopulmonary disease.   Electronically Signed   By: Hassan Rowan M.D.   On: 03/17/2014 15:30   US Abdomen Limited  03/18/2014   CLINICAL DATA:  Abdominal pain  EXAM: US ABDOMEN LIMITED - RIGHT UPPER QUADRANT  COMPARISON:  CT ABD/PELV WO CM dated 03/17/2014; SP TRANSCATHETER BX dated 03/12/2014  FINDINGS: Gallbladder:  The gallbladder contains heterogeneous sludge. There is wall thickening. Positive sonographic Murphy sign. There is also pericholecystic fluid. No definite stones are identified.  Common bile duct:  Diameter: 8 mm in caliber.  Liver:  No focal lesion identified. Within normal limits in parenchymal echogenicity.  IMPRESSION: Findings above are worrisome for a calculus acute cholecystitis.  Correlate clinically as for the need for nuclear medicine imaging.  Common bile duct is dilated at 8 mm. Biliary obstruction is not excluded.   Electronically Signed   By: Maryclare Bean M.D.   On: 03/18/2014 00:06    Medications / Allergies: per chart  Antibiotics: Anti-infectives   Start     Dose/Rate Route Frequency Ordered Stop   03/19/14 2200  levofloxacin (LEVAQUIN) IVPB 500 mg     500 mg 100 mL/hr over 60 Minutes Intravenous Every 48 hours 03/17/14 1931     03/17/14 2115  levofloxacin (LEVAQUIN) IVPB 750 mg     750 mg 100 mL/hr  over 90 Minutes Intravenous  Once 03/17/14 2112 03/18/14 0135   03/17/14 1945  metroNIDAZOLE (FLAGYL) IVPB 500 mg     500 mg 100 mL/hr over 60 Minutes Intravenous Every 8 hours 03/17/14 1931        Assessment/Plan RUQ abdominal pain possible acute cholecystitis  granulomatous hepatitis/cirrhosis  Acute on CKD stage V Upper abdominal and retroperitoneal lymphadenopathy  Splenomegaly  Hypercalcemia  Thrombocytopenia  Anemia   Unable to obtain retroperitoneal lymph node biopsy with laparoscopic approach.  The only way to achieve this is to perform an open cholecystectomy and lymph node biopsy.  He would be at an increased risk given his liver disease and thrombocytopenia.  Another option would be to proceed with a lap chole with close follow up imaging.  Dr. Ninfa Linden to discuss with the patient and primary team.  Further recommendations to follow.    Erby Pian, [facility] Pager [phone] Office [phone]  03/19/2014 8:31 AM

## 2014-03-19 NOTE — Consult Note (Signed)
Reason for Referral: Lymphadenopathy and thrombocytopenia.   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 Presented to the hospital on 03/17/2014 with abdominal pain that has been worsening since the biopsy was performed. CT scan of the abdomen done on 0000000 showed no complications from the biopsy but revealed extensive upper abdominal/retroperitoneal adenopathy raising suspicion for a malignancy. Also noted on radiological and clinical findings, he has evidence of acute cholecystitis for which he is considered for cholecystectomy in the near future. Also he was noted to have thrombocytopenia with a platelet count around 55,000. His platelet count have ranged between 100K to 120 K previously. I was asked to comment on these findings are as well as his risk of bleeding for possible open laparotomy. Clinically, he denied any symptoms of bleeding. He has not reported any petechiae or rashes. Has not reported any lymphadenopathy or constitutional symptoms. He haven't had really any open surgery recently. He did have a liver biopsy that was uncomplicated. He has had orthopedic surgeries in the past without any bleeding complications. He had not had any recent dental extractions or any oral surgery that was complicated with any bleeding.   Past Medical History  Diagnosis Date  . Anxiety   . HTN (hypertension)   . GERD (gastroesophageal reflux disease)     no longer, since having weight loss  . Headache(784.0)     Migraines, not as bad now  . Anemia   . Complication of anesthesia     bp dropped, difficulty to wake up with 1st knee surgery  . Pneumonia 01-30-14    "bacterial infection in lungs"-none recent  . Chronic kidney disease     [facility]Dr. Abner Greenspan dialysis yet.  . AVF  (arteriovenous fistula) 01-30-14    Left arm created 11'14  . Liver cirrhosis   . Alcohol abuse     quit 2014  . Thrombocytopenia   :  Past Surgical History  Procedure Laterality Date  . Knee arthroscopy Bilateral   . Av fistula placement Left 10/18/2013    Procedure: ARTERIOVENOUS (AV) FISTULA CREATION; ULTRASOUND GUIDED;  Surgeon: Angelia Mould, MD;  Location: [place];  Service: Vascular;  Laterality: Left;  . Tonsillectomy      child  . Esophagogastroduodenoscopy (egd) with propofol N/A 02/12/2014    Procedure: ESOPHAGOGASTRODUODENOSCOPY (EGD) WITH PROPOFOL;  Surgeon: Arta Silence, MD;  Location: [facility];  Service: Endoscopy;  Laterality: N/A;  . Gastric varices banding N/A 02/12/2014    Procedure: GASTRIC VARICES BANDING;  Surgeon: Arta Silence, MD;  Location: [facility];  Service: Endoscopy;  Laterality: N/A;  possible banding  . Esophagogastroduodenoscopy  01/2014    no gastric or esophageal varies, question gastroparesis  . Liver biopsy  03/12/2014    IR transjugular liver biopsy  :   Current Facility-Administered Medications  Medication Dose Route Frequency Provider Last Rate Last Dose  . 0.9 %  sodium chloride infusion   Intravenous Continuous Shanda Howells, MD 75 mL/hr at 03/18/14 2252    . amLODipine (NORVASC) tablet 5 mg  5 mg Oral Daily Shanda Howells, MD      . antiseptic oral rinse (BIOTENE) solution 15 mL  15 mL Mouth Rinse BID Modena Jansky, MD   15 mL at 03/18/14 2005  . cyanocobalamin tablet 250 mcg  250 mcg Oral Daily Shanda Howells, MD      .  cyclobenzaprine (FLEXERIL) tablet 10 mg  10 mg Oral TID PRN Shanda Howells, MD      . HYDROmorphone (DILAUDID) injection 1-2 mg  1-2 mg Intravenous Q2H PRN Shanda Howells, MD   2 mg at 03/19/14 [id number]  . levofloxacin (LEVAQUIN) IVPB 500 mg  500 mg Intravenous Q48H Shanda Howells, MD      . metroNIDAZOLE (FLAGYL) IVPB 500 mg  500 mg Intravenous Q8H Shanda Howells, MD 100 mL/hr at 03/19/14 0341 500 mg at 03/19/14 0341   . pantoprazole (PROTONIX) injection 40 mg  40 mg Intravenous Q12H Shanda Howells, MD   40 mg at 03/18/14 2252  . polyvinyl alcohol (LIQUIFILM TEARS) 1.4 % ophthalmic solution 1 drop  1 drop Both Eyes Q4H PRN Tad Moore, [facility]      . sevelamer carbonate (RENVELA) tablet 800 mg  800 mg Oral TID WC Shanda Howells, MD      . sodium chloride 0.9 % injection 3 mL  3 mL Intravenous Q12H Shanda Howells, MD          No Known Allergies:  Family History  Problem Relation Age of Onset  . Diabetes Mother   . Heart disease Mother     before age 63  . Hyperlipidemia Mother   . Hypertension Mother   . COPD Mother   . Diabetes Father   . Heart disease Father     before age 61  . Hyperlipidemia Father   . Hypertension Father   . Cancer Sister   . Hyperlipidemia Sister   . Hypertension Sister   :  History   Social History  . Marital Status: Married    Spouse Name: N/A    Number of Children: N/A  . Years of Education: N/A   Occupational History  . Not on file.   Social History Main Topics  . Smoking status: Former Smoker -- 2.00 packs/day for 16 years    Types: Cigarettes    Quit date: 09/20/1996  . Smokeless tobacco: Never Used  . Alcohol Use: 4.8 oz/week    0 Glasses of wine, 4 Cans of beer, 4 Shots of liquor per week     Comment: Quit 3 months ago 11'14  . Drug Use: No  . Sexual Activity: Yes   Other Topics Concern  . Not on file   Social History Narrative  . No narrative on file  :  Constitutional: negative for anorexia, fevers and night sweats Eyes: negative for icterus and irritation Ears, nose, mouth, throat, and face: negative for ear drainage and epistaxis Respiratory: negative for cough, dyspnea on exertion and pleurisy/chest pain Cardiovascular: negative for chest pain, exertional chest pressure/discomfort and irregular heart beat Gastrointestinal: positive for abdominal pain, negative for jaundice and vomiting Genitourinary:negative for frequency and  hematuria Integument/breast: negative for rash and skin color change Hematologic/lymphatic: negative for bleeding, easy bruising, lymphadenopathy and petechiae Musculoskeletal:negative for arthralgias and back pain Neurological: negative for seizures and vertigo Behavioral/Psych: negative for anxiety and depression Endocrine: negative for temperature intolerance Allergic/Immunologic: negative for urticaria  Exam: Blood pressure 133/82, pulse 81, temperature 97.9 F (36.6 C), temperature source Oral, resp. rate 18, height 6\' 4"  (1.93 m), weight 289 lb 9.6 oz (131.362 kg), SpO2 100.00%. General appearance: alert and cooperative Head: Normocephalic, without obvious abnormality, atraumatic Neck: no adenopathy, no carotid bruit, no JVD, supple, symmetrical, trachea midline and thyroid not enlarged, symmetric, no tenderness/mass/nodules Back: symmetric, no curvature. ROM normal. No CVA tenderness. Resp: clear to auscultation bilaterally Chest wall: no tenderness Cardio:  regular rate and rhythm, S1, S2 normal, no murmur, click, rub or gallop GI: soft, non-tender; bowel sounds normal; no masses,  no organomegaly Extremities: extremities normal, atraumatic, no cyanosis or edema Pulses: 2+ and symmetric Skin: Skin color, texture, turgor normal. No rashes or lesions Lymph nodes: Cervical, supraclavicular, and axillary nodes normal.   Recent Labs  03/17/14 1517 03/18/14 1105  WBC 4.5 4.8  HGB 10.5* 9.5*  HCT 30.0* 27.4*  PLT 56* 55*    Recent Labs  03/17/14 1430 03/17/14 1932 03/18/14 1105  NA 138  --  137  K 4.6  --  4.5  CL 99  --  99  CO2 19  --  16*  GLUCOSE 116*  --  103*  BUN 72*  --  72*  CREATININE 7.86*  --  7.52*  CALCIUM 12.2* 11.1* 11.2*       Ct Abdomen Pelvis Wo Contrast  03/17/2014   CLINICAL DATA:  Right side pain, short of breath and decreased appetite since transjugular liver biopsy last Wednesday.  EXAM: CT ABDOMEN AND PELVIS WITHOUT CONTRAST  TECHNIQUE:  Multidetector CT imaging of the abdomen and pelvis was performed following the standard protocol without intravenous contrast.  COMPARISON:  Chest x-ray earlier today 03/17/2014; images from transjugular hepatic biopsy 03/12/2014  FINDINGS: Lower Chest: Incompletely imaged nonspecific peribronchovascular ground-glass attenuation opacity in the inferior lingula. No evidence of a pneumothorax or pleural effusion. No suspicious pulmonary mass or nodule. Visualized cardiac structures within normal limits for size. Suggestion of atherosclerotic calcification in the coronary arteries. No pericardial effusion. Unremarkable distal thoracic esophagus. Nonspecific 1 cm paraesophageal lymph node (image 13).  Abdomen: Unremarkable CT appearance of the stomach, duodenum, and left adrenal gland. 2 cm low-attenuation (4.25 HU) right adrenal nodule consistent with a benign adenoma. Normal hepatic contour and morphology. No evidence of perihepatic hemorrhage or subcapsular hematoma. No overtly cirrhotic morphology. The gallbladder is distended and contains high attenuation material consistent with sludge and/ stones. No intra or extrahepatic biliary ductal dilatation. There is evidence of mild gallbladder wall thickening as well as a small amounts of inflammatory stranding. The spleen is enlarged. Unremarkable appearance of the pancreas.  Numerous abnormally enlarged upper abdominal lymph nodes. A 1.7 cm short axis node is identified in the gastrohepatic ligament (image 25) numerous additional nodes in the porta hepatis and hepatoduodenal ligament. The largest lies posterior to the pancreatic head and measures 5.0 x 2.6 cm. There is interstitial stranding in the region with mild thickening of the right anterior para renal fascia. Numerous retroperitoneal lymph nodes are conspicuous in number if not by size. A left para-aortic node measures 1.3 cm in short axis. No hemo peritoneum.  No evidence of obstruction or focal bowel wall  thickening. Normal appendix in the right lower quadrant. The terminal ileum is unremarkable.  Pelvis: Unremarkable bladder, prostate gland and seminal vesicles. No free fluid or suspicious adenopathy.  Bones/Soft Tissues: No acute fracture or aggressive appearing lytic or blastic osseous lesion.  Vascular: Limited evaluation in the absence of intravenous contrast. Atherosclerotic vascular disease without significant stenosis or aneurysmal dilatation.  IMPRESSION: 1. No evidence of acute complication related to the patient's biopsy procedure last week. Specifically, no pneumothorax, pleural effusion, perihepatic hemorrhage or subcapsular hematoma. 2. Extensive upper abdominal and retroperitoneal adenopathy highly concerning for lymphoma. Metastatic adenopathy is considered less likely. The largest node measures 5 x 2.6 cm just posterior to the pancreatic head. There is associated interstitial stranding in the adjacent fat which is favored to reflect  lymphatic congestion. Recommend further evaluation with PET-CT. 3. Mildly distended gallbladder containing sludge and/or small stones. There is mild thickening of the gallbladder wall. Acute or chronic cholecystitis is not excluded. Given the patient's right upper quadrant pain, consider further evaluation with right upper quadrant ultrasound to assess for cholecystitis. 4. Nonspecific and incompletely imaged ground-glass attenuation opacity in the inferior lingula. Differential considerations include atelectasis and bronchopneumonia. 5. Atherosclerotic vascular calcifications. Suspect coronary artery calcifications in the incompletely imaged heart. 6. Splenomegaly. This may be related to intrinsic liver disease and portal hypertension, or lymphoma. 7. Right adrenal adenoma.   Electronically Signed   By: Jacqulynn Cadet M.D.   On: 03/17/2014 18:02    Assessment and Plan:    48 year old gentleman with the following issues:  1. Upper abdominal and retroperitoneal  adenopathy discover incidentally after presenting with abdominal pain post liver biopsy. The differential diagnosis includes reactive lymphadenopathy, primary lymphatic neoplasm such as lymphoma, metastatic carcinoma in to regional lymph nodes, and other possibilities. I have personally reviewed the CT scan and certainly these lymph nodes are suspicious for a malignancy. I favor an excisional biopsy of these lymph nodes rather than core biopsy in case we are dealing with lymphoma. Excising the whole lymph node will give [place] a better idea if it's indeed lymphoma also what type and which help [place] prognosticate and dictate his future treatment if it's indeed malignancy. If this needs to be done through an open laparotomy I think that would be the best approach for make a definitive diagnosis. Without a biopsy, certainly we will have no idea what we dealing with.  2. Thrombocytopenia: This is certainly related to his liver disease probably sequestration from his splenomegaly. His platelet count remains above 50,000 which makes the risk of bleeding low at this point. An open laparotomy can be performed with his platelet counts as long as above 50,000. But given his liver disease, his renal insufficiency probably has some element of platelet dysfunction it is reasonable to transfuse platelets on the way to the operating room to ensure a platelet count close to 70,000. With this approach, his risk of bleeding should be small at this point.  3. Hypocalcemia: Unclear etiology and certainly very concerning for malignancy. A lymph node biopsy showed help at this point in determining the possible etiology.  I will followup on the biopsy results once it is performed. Please call with questions.

## 2014-03-19 NOTE — Progress Notes (Signed)
Patient ID: Steven Best, male   DOB: 1966/06/16, 48 y.o.   MRN: [id number] S:no new complaints O:BP 136/88  Pulse 85  Temp(Src) 97.9 F (36.6 C) (Oral)  Resp 17  Ht 6\' 4"  (1.93 m)  Wt 131.362 kg (289 lb 9.6 oz)  BMI 35.27 kg/m2  SpO2 100%  Intake/Output Summary (Last 24 hours) at 03/19/14 1249 Last data filed at 03/19/14 0900  Gross per 24 hour  Intake    120 ml  Output      0 ml  Net    120 ml   Intake/Output:    Intake/Output this shift:  Total I/O In: 120 [P.O.:120] Out: -  Weight change: 1.475 kg (3 lb 4 oz) Gen:WD WN obese WM lying in bed CVS:no rub Resp:cta Abd:+bs, soft, +tenderness to RUQ Ext:L FA AVF +T/B, no edema   Recent Labs Lab 03/17/14 1430 03/17/14 1932 03/18/14 1105 03/19/14 0711  NA 138  --  137 140  K 4.6  --  4.5 4.8  CL 99  --  99 103  CO2 19  --  16* 17*  GLUCOSE 116*  --  103* 97  BUN 72*  --  72* 76*  CREATININE 7.86*  --  7.52* 7.85*  ALBUMIN 4.2  --  3.7 3.7  CALCIUM 12.2* 11.1* 11.2* 11.0*  PHOS  --   --   --  6.7*  AST 21  --  41* 29  ALT 29  --  37 31   Liver Function Tests:  Recent Labs Lab 03/17/14 1430 03/18/14 1105 03/19/14 0711  AST 21 41* 29  ALT 29 37 31  ALKPHOS 143* 229* 195*  BILITOT 0.5 0.9 0.5  PROT 9.1* 8.2 8.1  ALBUMIN 4.2 3.7 3.7    Recent Labs Lab 03/17/14 1430  LIPASE 52   No results found for this basename: AMMONIA,  in the last 168 hours CBC:  Recent Labs Lab 03/17/14 1517 03/18/14 1105 03/19/14 0711  WBC 4.5 4.8 3.7*  NEUTROABS 3.3 3.3 2.6  HGB 10.5* 9.5* 9.0*  HCT 30.0* 27.4* 26.5*  MCV 92.3 93.2 94.3  PLT 56* 55* 53*   Cardiac Enzymes: No results found for this basename: CKTOTAL, CKMB, CKMBINDEX, TROPONINI,  in the last 168 hours CBG: No results found for this basename: GLUCAP,  in the last 168 hours  Iron Studies:  Recent Labs  03/18/14 1100  IRON 69  TIBC 288   Studies/Results: Ct Abdomen Pelvis Wo Contrast  03/17/2014   CLINICAL DATA:  Right side pain, short  of breath and decreased appetite since transjugular liver biopsy last Wednesday.  EXAM: CT ABDOMEN AND PELVIS WITHOUT CONTRAST  TECHNIQUE: Multidetector CT imaging of the abdomen and pelvis was performed following the standard protocol without intravenous contrast.  COMPARISON:  Chest x-ray earlier today 03/17/2014; images from transjugular hepatic biopsy 03/12/2014  FINDINGS: Lower Chest: Incompletely imaged nonspecific peribronchovascular ground-glass attenuation opacity in the inferior lingula. No evidence of a pneumothorax or pleural effusion. No suspicious pulmonary mass or nodule. Visualized cardiac structures within normal limits for size. Suggestion of atherosclerotic calcification in the coronary arteries. No pericardial effusion. Unremarkable distal thoracic esophagus. Nonspecific 1 cm paraesophageal lymph node (image 13).  Abdomen: Unremarkable CT appearance of the stomach, duodenum, and left adrenal gland. 2 cm low-attenuation (4.25 HU) right adrenal nodule consistent with a benign adenoma. Normal hepatic contour and morphology. No evidence of perihepatic hemorrhage or subcapsular hematoma. No overtly cirrhotic morphology. The gallbladder is distended and contains high attenuation  material consistent with sludge and/ stones. No intra or extrahepatic biliary ductal dilatation. There is evidence of mild gallbladder wall thickening as well as a small amounts of inflammatory stranding. The spleen is enlarged. Unremarkable appearance of the pancreas.  Numerous abnormally enlarged upper abdominal lymph nodes. A 1.7 cm short axis node is identified in the gastrohepatic ligament (image 25) numerous additional nodes in the porta hepatis and hepatoduodenal ligament. The largest lies posterior to the pancreatic head and measures 5.0 x 2.6 cm. There is interstitial stranding in the region with mild thickening of the right anterior para renal fascia. Numerous retroperitoneal lymph nodes are conspicuous in number if  not by size. A left para-aortic node measures 1.3 cm in short axis. No hemo peritoneum.  No evidence of obstruction or focal bowel wall thickening. Normal appendix in the right lower quadrant. The terminal ileum is unremarkable.  Pelvis: Unremarkable bladder, prostate gland and seminal vesicles. No free fluid or suspicious adenopathy.  Bones/Soft Tissues: No acute fracture or aggressive appearing lytic or blastic osseous lesion.  Vascular: Limited evaluation in the absence of intravenous contrast. Atherosclerotic vascular disease without significant stenosis or aneurysmal dilatation.  IMPRESSION: 1. No evidence of acute complication related to the patient's biopsy procedure last week. Specifically, no pneumothorax, pleural effusion, perihepatic hemorrhage or subcapsular hematoma. 2. Extensive upper abdominal and retroperitoneal adenopathy highly concerning for lymphoma. Metastatic adenopathy is considered less likely. The largest node measures 5 x 2.6 cm just posterior to the pancreatic head. There is associated interstitial stranding in the adjacent fat which is favored to reflect lymphatic congestion. Recommend further evaluation with PET-CT. 3. Mildly distended gallbladder containing sludge and/or small stones. There is mild thickening of the gallbladder wall. Acute or chronic cholecystitis is not excluded. Given the patient's right upper quadrant pain, consider further evaluation with right upper quadrant ultrasound to assess for cholecystitis. 4. Nonspecific and incompletely imaged ground-glass attenuation opacity in the inferior lingula. Differential considerations include atelectasis and bronchopneumonia. 5. Atherosclerotic vascular calcifications. Suspect coronary artery calcifications in the incompletely imaged heart. 6. Splenomegaly. This may be related to intrinsic liver disease and portal hypertension, or lymphoma. 7. Right adrenal adenoma.   Electronically Signed   By: Jacqulynn Cadet M.D.   On:  03/17/2014 18:02   Dg Chest 2 View  03/17/2014   CLINICAL DATA:  Shortness of breath  EXAM: CHEST  2 VIEW  COMPARISON:  None.  FINDINGS: The cardiomediastinal silhouette is unremarkable.  There is no evidence of focal airspace disease, pulmonary edema, suspicious pulmonary nodule/mass, pleural effusion, or pneumothorax. No acute bony abnormalities are identified.  IMPRESSION: No active cardiopulmonary disease.   Electronically Signed   By: Hassan Rowan M.D.   On: 03/17/2014 15:30   US Abdomen Limited  03/18/2014   CLINICAL DATA:  Abdominal pain  EXAM: US ABDOMEN LIMITED - RIGHT UPPER QUADRANT  COMPARISON:  CT ABD/PELV WO CM dated 03/17/2014; SP TRANSCATHETER BX dated 03/12/2014  FINDINGS: Gallbladder:  The gallbladder contains heterogeneous sludge. There is wall thickening. Positive sonographic Murphy sign. There is also pericholecystic fluid. No definite stones are identified.  Common bile duct:  Diameter: 8 mm in caliber.  Liver:  No focal lesion identified. Within normal limits in parenchymal echogenicity.  IMPRESSION: Findings above are worrisome for a calculus acute cholecystitis. Correlate clinically as for the need for nuclear medicine imaging.  Common bile duct is dilated at 8 mm. Biliary obstruction is not excluded.   Electronically Signed   By: Duffy Rhody.D.  On: 03/18/2014 00:06   . amLODipine  5 mg Oral Daily  . antiseptic oral rinse  15 mL Mouth Rinse BID  . cyanocobalamin  250 mcg Oral Daily  . levofloxacin (LEVAQUIN) IV  500 mg Intravenous Q48H  . metronidazole  500 mg Intravenous Q8H  . pantoprazole (PROTONIX) IV  40 mg Intravenous Q12H  . sevelamer carbonate  800 mg Oral TID WC  . sodium chloride  3 mL Intravenous Q12H    BMET    Component Value Date/Time   NA 140 03/19/2014 0711   NA 137 05/01/2013 1033   K 4.8 03/19/2014 0711   K 4.5 05/01/2013 1033   CL 103 03/19/2014 0711   CL 105 05/01/2013 1033   CO2 17* 03/19/2014 0711   CO2 17* 05/01/2013 1033   GLUCOSE 97 03/19/2014 0711    GLUCOSE 89 05/01/2013 1033   BUN 76* 03/19/2014 0711   BUN 85.7* 05/01/2013 1033   CREATININE 7.85* 03/19/2014 0711   CREATININE 6.6 Repeated and Verified* 05/01/2013 1033   CALCIUM 11.0* 03/19/2014 0711   CALCIUM 11.1* 03/17/2014 1932   CALCIUM 9.9 05/01/2013 1033   GFRNONAA 7* 03/19/2014 0711   GFRAA 8* 03/19/2014 0711   CBC    Component Value Date/Time   WBC 3.7* 03/19/2014 0711   WBC 3.2* 05/01/2013 1033   RBC 2.81* 03/19/2014 0711   RBC 3.26* 05/01/2013 1033   HGB 9.0* 03/19/2014 0711   HGB 11.0* 05/01/2013 1033   HCT 26.5* 03/19/2014 0711   HCT 32.0* 05/01/2013 1033   PLT 53* 03/19/2014 0711   PLT 110* 05/01/2013 1033   MCV 94.3 03/19/2014 0711   MCV 98.1* 05/01/2013 1033   MCH 32.0 03/19/2014 0711   MCH 33.8* 05/01/2013 1033   MCHC 34.0 03/19/2014 0711   MCHC 34.4 05/01/2013 1033   RDW 13.5 03/19/2014 0711   RDW 13.1 05/01/2013 1033   LYMPHSABS 0.5* 03/19/2014 0711   LYMPHSABS 0.4* 05/01/2013 1033   MONOABS 0.4 03/19/2014 0711   MONOABS 0.5 05/01/2013 1033   EOSABS 0.2 03/19/2014 0711   EOSABS 0.1 05/01/2013 1033   BASOSABS 0.0 03/19/2014 0711   BASOSABS 0.0 05/01/2013 1033    Assessment/Plan:  1. AKI/CKD- in setting of decreased po intake and likely ischemic ATN, also with hypercalcemia.  No evidence of uremia but will cont to follow closely. 2. Acute cholecystits- for lap chole tomorrow 3. Hypercalcemia- ?sarcoidosis vs. Malignancy.  W/u pending but will require open biopsy per [facility].  Would also check ACE level since sarcoidosis is also on the DDx 1. Calcitonin nasal spray 2. iPTH pending will add ACE level 3. Cont to follow and consider lasix 4. Thrombocytopenia- per Heme/Onc, possibly related to splenomegaly 5. Cirrhosis- c/b thrombocytopenia, splenomegaly, for another biopsy tomorrow during lap chole 6. Diffuse intra-abdominal retroperitoneal lymphadenopathy- will need open biopsy 7. Vascular access- LAVF mature and ready for use if needed  Donetta Potts

## 2014-03-19 NOTE — Progress Notes (Signed)
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 Denied nausea, fever or chills. In the ED, CT scan of the abdomen showed no active complications from specific biopsy site but did show extensive upper abdominal and retroperitoneal adenopathy concerning for lymphoma. There is also mild distended gallbladder gallbladder thickening.? Lingular pneumonia versus atelectasis. Right adrenal adenoma. Creatinine at 7.6. Baseline seems to be between 5 and 7. Calcium 12.2. Alkaline phosphatase 143. Lipase within normal limits at 52. Platelet count is 56. Hemodynamically stable. Afebrile. Satting 99-100% on room air.   Assessment/Plan:  1. RUQ pain: Possible acute cholecystitis. Continue clear liquids., IV fluids, IV antibiotics (Levaquin and Flagyl) and pain management. GI and surgical consultation appreciated. Surgery and hematology followup appreciated. Plan is to proceed to or tomorrow for laparoscopic/open cholecystectomy with lymph node biopsy and liver biopsy at same time, after platelet transfusion. 2. 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 Management per GI-consultation appreciated. Patient has lymphadenopathy on CT abdomen but according to IR cannot be accessed by percutaneous route. Plan for repeat liver biopsy during laparoscopy or properly. 3. CKD 5/metabolic acidosis: Not started dialysis yet. Apparently transplant candidate. Nephrology consultation and followup  appreciated. 4. Anemia: Multifactorial secondary to ESRD, splenomegaly and chronic disease. Stable. Transfuse if hemoglobin less than 7 g per DL. 5. Thrombocytopenia: Patient seems to have chronic thrombocytopenia but has dropped from 122 on 4/15 to 56 on 4/20. Stable over the last 48 hours.Secondary to splenomegaly and sequestration. Hematology consultation appreciated-recommend transfusing platelets one way to the operating room to ensure platelet count close to 70,000. 6. Intra-abdominal/retroperitoneal lymphadenopathy: Unclear etiology. DD-reactive, lymphoma, metastatic carcinoma, sarcoid. As per IR, unable to percutaneous biopsy. Surgery plans for excisional biopsy during laparoscopy/laparotomy on 4/23. 7. Hypercalcemia: DD-sarcoid, malignancy. Continue IV fluids. Await lymph node biopsy. Followup ACE level and intact PTH. Calcitonin nasal spray. 8. Hypertension: Controlled  Code Status: Full Family Communication: None at bedside Disposition Plan: Remains inpatient   Consultants:  Gastroenterology  General surgery  Nephrology  Hematology  Procedures:  None  Antibiotics:  IV levofloxacin  IV Flagyl   Subjective: RUQ pain-controlled with pain medications. No other complaints.  Objective: Filed Vitals:   03/19/14 0406 03/19/14 0614 03/19/14 0913 03/19/14 1250  BP:  133/82 136/88 134/80  Pulse:  81 85 82  Temp:  97.9 F (36.6 C) 97.9 F (36.6 C) 97.8 F (36.6 C)  TempSrc:  Oral Oral Oral  Resp:  18 17 18   Height:      Weight: 131.362 kg (289 lb 9.6 oz)     SpO2:  100% 100% 100%    Intake/Output Summary (Last 24 hours) at 03/19/14 1710 Last data filed at 03/19/14 0900  Gross per 24 hour  Intake    120 ml  Output      0 ml  Net    120 ml   Filed Weights   03/18/14 0400 03/18/14 2134 03/19/14 0406  Weight: 130.727 kg (288 lb 3.2 oz) 130.75 kg (288 lb 4 oz) 131.362 kg (289 lb 9.6 oz)     Exam:  General exam: Moderately built and obese male lying  comfortably in bed. Respiratory system: Clear. No increased work of breathing. Cardiovascular system: S1 & S2 heard, RRR. No JVD, murmurs, gallops, clicks. Trace bilateral ankle edema. Telemetry: Sinus rhythm. Gastrointestinal system: Abdomen is obese with mild RUQ tenderness without rigidity or rebound. Abdomen soft.. Normal bowel sounds heard. Central nervous system: Alert and oriented. No focal neurological deficits. Extremities: Symmetric 5 x 5 power.   Data Reviewed: Basic Metabolic Panel:  Recent Labs Lab 03/17/14 1430 03/17/14 1932 03/18/14 1105 03/19/14 0711  NA 138  --  137 140  K 4.6  --  4.5 4.8  CL 99  --  99 103  CO2 19  --  16* 17*  GLUCOSE 116*  --  103* 97  BUN 72*  --  72* 76*  CREATININE 7.86*  --  7.52* 7.85*  CALCIUM 12.2* 11.1* 11.2* 11.0*  MG  --   --   --  2.5  PHOS  --   --   --  6.7*   Liver Function Tests:  Recent Labs Lab 03/17/14 1430 03/18/14 1105 03/19/14 0711  AST 21 41* 29  ALT 29 37 31  ALKPHOS 143* 229* 195*  BILITOT 0.5 0.9 0.5  PROT 9.1* 8.2 8.1  ALBUMIN 4.2 3.7 3.7    Recent Labs Lab 03/17/14 1430  LIPASE 52   No results found for this basename: AMMONIA,  in the last 168 hours CBC:  Recent Labs Lab 03/17/14 1517 03/18/14 1105 03/19/14 0711  WBC 4.5 4.8 3.7*  NEUTROABS 3.3 3.3 2.6  HGB 10.5* 9.5* 9.0*  HCT 30.0* 27.4* 26.5*  MCV 92.3 93.2 94.3  PLT 56* 55* 53*   Cardiac Enzymes: No results found for this basename: CKTOTAL, CKMB, CKMBINDEX, TROPONINI,  in the last 168 hours BNP (last 3 results) No results found for this basename: PROBNP,  in the last 8760 hours CBG: No results found for this basename: GLUCAP,  in the last 168 hours  No results found for this or any previous visit (from the past 240 hour(s)).     Studies: Ct Abdomen Pelvis Wo Contrast  03/17/2014   CLINICAL DATA:  Right side pain, short of breath and decreased appetite since transjugular liver biopsy last Wednesday.  EXAM: CT ABDOMEN AND  PELVIS WITHOUT CONTRAST  TECHNIQUE: Multidetector CT imaging of the abdomen and pelvis was performed following the standard protocol without intravenous contrast.  COMPARISON:  Chest x-ray earlier today 03/17/2014; images from transjugular hepatic biopsy 03/12/2014  FINDINGS: Lower Chest: Incompletely imaged nonspecific peribronchovascular ground-glass attenuation opacity in the inferior lingula. No evidence of a pneumothorax or pleural effusion. No suspicious pulmonary mass or nodule. Visualized cardiac structures within normal limits for size. Suggestion of atherosclerotic calcification in the coronary arteries. No pericardial effusion. Unremarkable distal thoracic esophagus. Nonspecific 1 cm paraesophageal lymph node (image 13).  Abdomen: Unremarkable CT appearance of the stomach, duodenum, and left adrenal gland. 2 cm low-attenuation (4.25 HU) right adrenal nodule consistent with a benign adenoma. Normal hepatic contour and morphology. No evidence of perihepatic hemorrhage or subcapsular hematoma. No overtly cirrhotic morphology. The gallbladder is distended and contains high attenuation material consistent with sludge and/ stones. No intra or extrahepatic biliary ductal dilatation. There is evidence of mild gallbladder wall thickening as well as a small amounts of inflammatory stranding. The spleen is enlarged. Unremarkable appearance of the pancreas.  Numerous abnormally enlarged upper abdominal lymph nodes. A 1.7 cm short axis node is identified in the gastrohepatic ligament (image 25) numerous additional nodes in the porta  hepatis and hepatoduodenal ligament. The largest lies posterior to the pancreatic head and measures 5.0 x 2.6 cm. There is interstitial stranding in the region with mild thickening of the right anterior para renal fascia. Numerous retroperitoneal lymph nodes are conspicuous in number if not by size. A left para-aortic node measures 1.3 cm in short axis. No hemo peritoneum.  No evidence of  obstruction or focal bowel wall thickening. Normal appendix in the right lower quadrant. The terminal ileum is unremarkable.  Pelvis: Unremarkable bladder, prostate gland and seminal vesicles. No free fluid or suspicious adenopathy.  Bones/Soft Tissues: No acute fracture or aggressive appearing lytic or blastic osseous lesion.  Vascular: Limited evaluation in the absence of intravenous contrast. Atherosclerotic vascular disease without significant stenosis or aneurysmal dilatation.  IMPRESSION: 1. No evidence of acute complication related to the patient's biopsy procedure last week. Specifically, no pneumothorax, pleural effusion, perihepatic hemorrhage or subcapsular hematoma. 2. Extensive upper abdominal and retroperitoneal adenopathy highly concerning for lymphoma. Metastatic adenopathy is considered less likely. The largest node measures 5 x 2.6 cm just posterior to the pancreatic head. There is associated interstitial stranding in the adjacent fat which is favored to reflect lymphatic congestion. Recommend further evaluation with PET-CT. 3. Mildly distended gallbladder containing sludge and/or small stones. There is mild thickening of the gallbladder wall. Acute or chronic cholecystitis is not excluded. Given the patient's right upper quadrant pain, consider further evaluation with right upper quadrant ultrasound to assess for cholecystitis. 4. Nonspecific and incompletely imaged ground-glass attenuation opacity in the inferior lingula. Differential considerations include atelectasis and bronchopneumonia. 5. Atherosclerotic vascular calcifications. Suspect coronary artery calcifications in the incompletely imaged heart. 6. Splenomegaly. This may be related to intrinsic liver disease and portal hypertension, or lymphoma. 7. Right adrenal adenoma.   Electronically Signed   By: Jacqulynn Cadet M.D.   On: 03/17/2014 18:02   US Abdomen Limited  03/18/2014   CLINICAL DATA:  Abdominal pain  EXAM: US ABDOMEN  LIMITED - RIGHT UPPER QUADRANT  COMPARISON:  CT ABD/PELV WO CM dated 03/17/2014; SP TRANSCATHETER BX dated 03/12/2014  FINDINGS: Gallbladder:  The gallbladder contains heterogeneous sludge. There is wall thickening. Positive sonographic Murphy sign. There is also pericholecystic fluid. No definite stones are identified.  Common bile duct:  Diameter: 8 mm in caliber.  Liver:  No focal lesion identified. Within normal limits in parenchymal echogenicity.  IMPRESSION: Findings above are worrisome for a calculus acute cholecystitis. Correlate clinically as for the need for nuclear medicine imaging.  Common bile duct is dilated at 8 mm. Biliary obstruction is not excluded.   Electronically Signed   By: Maryclare Bean M.D.   On: 03/18/2014 00:06        Scheduled Meds: . amLODipine  5 mg Oral Daily  . antiseptic oral rinse  15 mL Mouth Rinse BID  . calcitonin (salmon)  1 spray Alternating Nares Daily  . cyanocobalamin  250 mcg Oral Daily  . levofloxacin (LEVAQUIN) IV  500 mg Intravenous Q48H  . metronidazole  500 mg Intravenous Q8H  . pantoprazole (PROTONIX) IV  40 mg Intravenous Q12H  . sevelamer carbonate  800 mg Oral TID WC  . sodium chloride  3 mL Intravenous Q12H   Continuous Infusions: . sodium chloride 75 mL/hr at 03/18/14 2252    Active Problems:   Lymphoma   Cholecystitis, acute   Chronic kidney disease, stage V   Anemia   Thrombocytopenia, unspecified   Abdominal lymphadenopathy    Time spent: 25 minutes.  Modena Jansky, MD, FACP, [facility]. [facility] Pager [phone]  If 7PM-7AM, please contact night-coverage [email or web address] Password [email or web address] 03/19/2014, 5:10 PM    LOS: 2 days

## 2014-03-19 NOTE — Progress Notes (Signed)
Patient ID: Steven Best, male   DOB: 02/27/1966, 48 y.o.   MRN: [id number]  Discussed with Dr. Oletta Lamas.  The previous liver biopsy may have not been adequate, so I will also do a tru-cut liver biopsy at the time of surgery

## 2014-03-19 NOTE — Progress Notes (Signed)
[facility] PROGRESS NOTE Subjective Tentative plan for surgery tomorrow. Still RUQ pain  Objective: Vital signs in last 24 hours: Temp:  [97.5 F (36.4 C)-98.3 F (36.8 C)] 97.9 F (36.6 C) (04/22 0913) Pulse Rate:  [81-100] 85 (04/22 0913) Resp:  [17-20] 17 (04/22 0913) BP: (127-136)/(80-88) 136/88 mmHg (04/22 0913) SpO2:  [100 %] 100 % (04/22 0913) Weight:  [130.75 kg (288 lb 4 oz)-131.362 kg (289 lb 9.6 oz)] 131.362 kg (289 lb 9.6 oz) (04/22 0406) Last BM Date: 03/13/14  Intake/Output from previous day:   Intake/Output this shift: Total I/O In: 120 [P.O.:120] Out: -   PE: General--alert  Abdomen--+BSs, RUQ tenderness no change  Lab Results:  Recent Labs  03/17/14 1517 03/18/14 1105 03/19/14 0711  WBC 4.5 4.8 3.7*  HGB 10.5* 9.5* 9.0*  HCT 30.0* 27.4* 26.5*  PLT 56* 55* 53*   BMET  Recent Labs  03/17/14 1430 03/18/14 1105 03/19/14 0711  NA 138 137 140  K 4.6 4.5 4.8  CL 99 99 103  CO2 19 16* 17*  CREATININE 7.86* 7.52* 7.85*   LFT  Recent Labs  03/17/14 1430 03/18/14 1105 03/19/14 0711  PROT 9.1* 8.2 8.1  AST 21 41* 29  ALT 29 37 31  ALKPHOS 143* 229* 195*  BILITOT 0.5 0.9 0.5   PT/INR  Recent Labs  03/17/14 1915  LABPROT 13.1  INR 1.01   PANCREAS  Recent Labs  03/17/14 1430  LIPASE 52         Studies/Results: Ct Abdomen Pelvis Wo Contrast  03/17/2014   CLINICAL DATA:  Right side pain, short of breath and decreased appetite since transjugular liver biopsy last Wednesday.  EXAM: CT ABDOMEN AND PELVIS WITHOUT CONTRAST  TECHNIQUE: Multidetector CT imaging of the abdomen and pelvis was performed following the standard protocol without intravenous contrast.  COMPARISON:  Chest x-ray earlier today 03/17/2014; images from transjugular hepatic biopsy 03/12/2014  FINDINGS: Lower Chest: Incompletely imaged nonspecific peribronchovascular ground-glass attenuation opacity in the inferior lingula. No evidence of a pneumothorax  or pleural effusion. No suspicious pulmonary mass or nodule. Visualized cardiac structures within normal limits for size. Suggestion of atherosclerotic calcification in the coronary arteries. No pericardial effusion. Unremarkable distal thoracic esophagus. Nonspecific 1 cm paraesophageal lymph node (image 13).  Abdomen: Unremarkable CT appearance of the stomach, duodenum, and left adrenal gland. 2 cm low-attenuation (4.25 HU) right adrenal nodule consistent with a benign adenoma. Normal hepatic contour and morphology. No evidence of perihepatic hemorrhage or subcapsular hematoma. No overtly cirrhotic morphology. The gallbladder is distended and contains high attenuation material consistent with sludge and/ stones. No intra or extrahepatic biliary ductal dilatation. There is evidence of mild gallbladder wall thickening as well as a small amounts of inflammatory stranding. The spleen is enlarged. Unremarkable appearance of the pancreas.  Numerous abnormally enlarged upper abdominal lymph nodes. A 1.7 cm short axis node is identified in the gastrohepatic ligament (image 25) numerous additional nodes in the porta hepatis and hepatoduodenal ligament. The largest lies posterior to the pancreatic head and measures 5.0 x 2.6 cm. There is interstitial stranding in the region with mild thickening of the right anterior para renal fascia. Numerous retroperitoneal lymph nodes are conspicuous in number if not by size. A left para-aortic node measures 1.3 cm in short axis. No hemo peritoneum.  No evidence of obstruction or focal bowel wall thickening. Normal appendix in the right lower quadrant. The terminal ileum is unremarkable.  Pelvis: Unremarkable bladder, prostate gland and seminal vesicles. No free  fluid or suspicious adenopathy.  Bones/Soft Tissues: No acute fracture or aggressive appearing lytic or blastic osseous lesion.  Vascular: Limited evaluation in the absence of intravenous contrast. Atherosclerotic vascular  disease without significant stenosis or aneurysmal dilatation.  IMPRESSION: 1. No evidence of acute complication related to the patient's biopsy procedure last week. Specifically, no pneumothorax, pleural effusion, perihepatic hemorrhage or subcapsular hematoma. 2. Extensive upper abdominal and retroperitoneal adenopathy highly concerning for lymphoma. Metastatic adenopathy is considered less likely. The largest node measures 5 x 2.6 cm just posterior to the pancreatic head. There is associated interstitial stranding in the adjacent fat which is favored to reflect lymphatic congestion. Recommend further evaluation with PET-CT. 3. Mildly distended gallbladder containing sludge and/or small stones. There is mild thickening of the gallbladder wall. Acute or chronic cholecystitis is not excluded. Given the patient's right upper quadrant pain, consider further evaluation with right upper quadrant ultrasound to assess for cholecystitis. 4. Nonspecific and incompletely imaged ground-glass attenuation opacity in the inferior lingula. Differential considerations include atelectasis and bronchopneumonia. 5. Atherosclerotic vascular calcifications. Suspect coronary artery calcifications in the incompletely imaged heart. 6. Splenomegaly. This may be related to intrinsic liver disease and portal hypertension, or lymphoma. 7. Right adrenal adenoma.   Electronically Signed   By: Jacqulynn Cadet M.D.   On: 03/17/2014 18:02   Dg Chest 2 View  03/17/2014   CLINICAL DATA:  Shortness of breath  EXAM: CHEST  2 VIEW  COMPARISON:  None.  FINDINGS: The cardiomediastinal silhouette is unremarkable.  There is no evidence of focal airspace disease, pulmonary edema, suspicious pulmonary nodule/mass, pleural effusion, or pneumothorax. No acute bony abnormalities are identified.  IMPRESSION: No active cardiopulmonary disease.   Electronically Signed   By: Hassan Rowan M.D.   On: 03/17/2014 15:30   US Abdomen Limited  03/18/2014   CLINICAL  DATA:  Abdominal pain  EXAM: US ABDOMEN LIMITED - RIGHT UPPER QUADRANT  COMPARISON:  CT ABD/PELV WO CM dated 03/17/2014; SP TRANSCATHETER BX dated 03/12/2014  FINDINGS: Gallbladder:  The gallbladder contains heterogeneous sludge. There is wall thickening. Positive sonographic Murphy sign. There is also pericholecystic fluid. No definite stones are identified.  Common bile duct:  Diameter: 8 mm in caliber.  Liver:  No focal lesion identified. Within normal limits in parenchymal echogenicity.  IMPRESSION: Findings above are worrisome for a calculus acute cholecystitis. Correlate clinically as for the need for nuclear medicine imaging.  Common bile duct is dilated at 8 mm. Biliary obstruction is not excluded.   Electronically Signed   By: Maryclare Bean M.D.   On: 03/18/2014 00:06    Medications: I have reviewed the patient's current medications.  Assessment/Plan: 1. RUQ Pain/Cholecystitis for surgery tomorrow. 2. Granulomatous Hepatitis 3. Abd Lymphadenopathy  Hopefully can obtain LNs and additional liver tissue at the time of surgery.   Winfield Cunas. 03/19/2014, 11:57 AM

## 2014-03-19 NOTE — Progress Notes (Signed)
I have seen and examined the patient and agree with the assessment and plans.  This remains a difficult situation given his overall health. Will plan to proceed to the OR tomorrow for a Laparoscopic, possible open cholecystectomy with lymph node biopsy.  I suspect I will have to convert to an open procedure for the lymph node biopsy. I will plan on transfusing platelets preop. I discussed the risks with him in detail including but not limited to bleeding, infection, injury to surrounding structures, conversion to an open procedure, need for further surgery if the node biopsy is non diagnostic, need for dialysis post op, cardiopulmonary problems, etc.  He agrees to proceed.  Kristalyn Bergstresser A. Ninfa Linden  MD, FACS

## 2014-03-20 ENCOUNTER — Encounter (HOSPITAL_COMMUNITY): Payer: Self-pay | Admitting: Anesthesiology

## 2014-03-20 ENCOUNTER — Encounter (HOSPITAL_COMMUNITY)
Admission: EM | Disposition: A | Discharge status: Home / Self Care | Payer: Self-pay | Source: Home / Self Care | Attending: Internal Medicine

## 2014-03-20 ENCOUNTER — Inpatient Hospital Stay (HOSPITAL_COMMUNITY): Payer: BC Managed Care – PPO | Admitting: Anesthesiology

## 2014-03-20 ENCOUNTER — Encounter (HOSPITAL_COMMUNITY): Payer: BC Managed Care – PPO | Admitting: Anesthesiology

## 2014-03-20 DIAGNOSIS — K811 Chronic cholecystitis: Secondary | ICD-10-CM

## 2014-03-20 DIAGNOSIS — K759 Inflammatory liver disease, unspecified: Secondary | ICD-10-CM

## 2014-03-20 DIAGNOSIS — N185 Chronic kidney disease, stage 5: Secondary | ICD-10-CM

## 2014-03-20 HISTORY — PX: LIVER BIOPSY: SHX301

## 2014-03-20 HISTORY — PX: CHOLECYSTECTOMY: SHX55

## 2014-03-20 HISTORY — PX: LAPAROSCOPIC PELVIC LYMPH NODE BIOPSY: SHX5914

## 2014-03-20 LAB — COMPREHENSIVE METABOLIC PANEL
ALK PHOS: 164 U/L — AB (ref 39–117)
ALT: 25 U/L (ref 0–53)
AST: 22 U/L (ref 0–37)
Albumin: 3.7 g/dL (ref 3.5–5.2)
BUN: 74 mg/dL — ABNORMAL HIGH (ref 6–23)
CO2: 16 mEq/L — ABNORMAL LOW (ref 19–32)
Calcium: 10.5 mg/dL (ref 8.4–10.5)
Chloride: 104 mEq/L (ref 96–112)
Creatinine, Ser: 7.29 mg/dL — ABNORMAL HIGH (ref 0.50–1.35)
GFR, EST AFRICAN AMERICAN: 9 mL/min — AB (ref >90)
GFR, EST NON AFRICAN AMERICAN: 8 mL/min — AB (ref >90)
GLUCOSE: 100 mg/dL — AB (ref 70–99)
Potassium: 4.4 mEq/L (ref 3.7–5.3)
SODIUM: 139 meq/L (ref 137–147)
Total Bilirubin: 0.4 mg/dL (ref 0.3–1.2)
Total Protein: 7.9 g/dL (ref 6.0–8.3)

## 2014-03-20 LAB — CBC WITH DIFFERENTIAL/PLATELET
BASOS ABS: 0 10*3/uL (ref 0.0–0.1)
BASOS PCT: 1 % (ref 0–1)
EOS ABS: 0.2 10*3/uL (ref 0.0–0.7)
Eosinophils Relative: 5 % (ref 0–5)
HCT: 26.2 % — ABNORMAL LOW (ref 39.0–52.0)
Hemoglobin: 8.9 g/dL — ABNORMAL LOW (ref 13.0–17.0)
Lymphocytes Relative: 11 % — ABNORMAL LOW (ref 12–46)
Lymphs Abs: 0.4 10*3/uL — ABNORMAL LOW (ref 0.7–4.0)
MCH: 32 pg (ref 26.0–34.0)
MCHC: 34 g/dL (ref 30.0–36.0)
MCV: 94.2 fL (ref 78.0–100.0)
MONOS PCT: 9 % (ref 3–12)
Monocytes Absolute: 0.3 10*3/uL (ref 0.1–1.0)
NEUTROS ABS: 2.7 10*3/uL (ref 1.7–7.7)
NEUTROS PCT: 74 % (ref 43–77)
Platelets: 69 10*3/uL — ABNORMAL LOW (ref 150–400)
RBC: 2.78 MIL/uL — ABNORMAL LOW (ref 4.22–5.81)
RDW: 13.4 % (ref 11.5–15.5)
WBC: 3.6 10*3/uL — ABNORMAL LOW (ref 4.0–10.5)

## 2014-03-20 LAB — ANTI-SMOOTH MUSCLE ANTIBODY, IGG: F-Actin IgG: 23 U — ABNORMAL HIGH (ref <20)

## 2014-03-20 LAB — PROTEIN ELECTROPHORESIS, SERUM
ALPHA-2-GLOBULIN: 13.9 % — AB (ref 7.1–11.8)
Albumin ELP: 52.3 % — ABNORMAL LOW (ref 55.8–66.1)
Alpha-1-Globulin: 5.8 % — ABNORMAL HIGH (ref 2.9–4.9)
BETA 2: 6 % (ref 3.2–6.5)
BETA GLOBULIN: 4.7 % (ref 4.7–7.2)
Gamma Globulin: 17.3 % (ref 11.1–18.8)
M-SPIKE, %: NOT DETECTED g/dL
TOTAL PROTEIN ELP: 7.9 g/dL (ref 6.0–8.3)

## 2014-03-20 LAB — TYPE AND SCREEN
ABO/RH(D): O NEG
ANTIBODY SCREEN: NEGATIVE

## 2014-03-20 LAB — SURGICAL PCR SCREEN
MRSA, PCR: NEGATIVE
Staphylococcus aureus: POSITIVE — AB

## 2014-03-20 LAB — MITOCHONDRIAL ANTIBODIES: Mitochondrial M2 Ab, IgG: 0.25 (ref <0.91)

## 2014-03-20 SURGERY — LAPAROSCOPIC CHOLECYSTECTOMY
Anesthesia: General | Site: Abdomen

## 2014-03-20 MED ORDER — GLYCOPYRROLATE 0.2 MG/ML IJ SOLN
INTRAMUSCULAR | Status: DC | PRN
  Administered 2014-03-20: .8 mg via INTRAVENOUS

## 2014-03-20 MED ORDER — GLYCOPYRROLATE 0.2 MG/ML IJ SOLN
INTRAMUSCULAR | Status: AC
  Filled 2014-03-20: qty 2

## 2014-03-20 MED ORDER — ARTIFICIAL TEARS OP OINT
TOPICAL_OINTMENT | OPHTHALMIC | Status: DC | PRN
  Administered 2014-03-20: 1 via OPHTHALMIC

## 2014-03-20 MED ORDER — PROMETHAZINE HCL 25 MG/ML IJ SOLN: 6.2500 mg | INTRAMUSCULAR | Status: DC | PRN

## 2014-03-20 MED ORDER — 0.9 % SODIUM CHLORIDE (POUR BTL) OPTIME
TOPICAL | Status: DC | PRN
  Administered 2014-03-20 (×2): 1000 mL

## 2014-03-20 MED ORDER — ARTIFICIAL TEARS OP OINT
TOPICAL_OINTMENT | OPHTHALMIC | Status: AC
  Filled 2014-03-20: qty 3.5

## 2014-03-20 MED ORDER — ONDANSETRON HCL 4 MG/2ML IJ SOLN
INTRAMUSCULAR | Status: AC
  Filled 2014-03-20: qty 2

## 2014-03-20 MED ORDER — PROPOFOL 10 MG/ML IV BOLUS
INTRAVENOUS | Status: AC
  Filled 2014-03-20: qty 20

## 2014-03-20 MED ORDER — MIDAZOLAM HCL 5 MG/5ML IJ SOLN
INTRAMUSCULAR | Status: DC | PRN
  Administered 2014-03-20: 2 mg via INTRAVENOUS

## 2014-03-20 MED ORDER — HYDROMORPHONE HCL PF 1 MG/ML IJ SOLN
0.2500 mg | INTRAMUSCULAR | Status: DC | PRN
Start: 2014-03-20 — End: 2014-03-20
  Administered 2014-03-20 (×3): 0.5 mg via INTRAVENOUS

## 2014-03-20 MED ORDER — DIPHENHYDRAMINE HCL 50 MG/ML IJ SOLN: 12.5000 mg | Freq: Four times a day (QID) | INTRAMUSCULAR | Status: DC | PRN

## 2014-03-20 MED ORDER — FENTANYL CITRATE 0.05 MG/ML IJ SOLN
INTRAMUSCULAR | Status: DC | PRN
  Administered 2014-03-20 (×6): 50 ug via INTRAVENOUS
  Administered 2014-03-20: 100 ug via INTRAVENOUS
  Administered 2014-03-20 (×2): 50 ug via INTRAVENOUS

## 2014-03-20 MED ORDER — SODIUM CHLORIDE 0.9 % IJ SOLN: 9.0000 mL | INTRAMUSCULAR | Status: DC | PRN

## 2014-03-20 MED ORDER — SODIUM CHLORIDE 0.9 % IR SOLN
Status: DC | PRN
  Administered 2014-03-20 (×2): 1

## 2014-03-20 MED ORDER — LACTATED RINGERS IV SOLN: INTRAVENOUS | Status: DC

## 2014-03-20 MED ORDER — ONDANSETRON HCL 4 MG/2ML IJ SOLN
INTRAMUSCULAR | Status: DC | PRN
  Administered 2014-03-20: 4 mg via INTRAVENOUS

## 2014-03-20 MED ORDER — BUPIVACAINE-EPINEPHRINE 0.25% -1:200000 IJ SOLN
INTRAMUSCULAR | Status: DC | PRN
  Administered 2014-03-20: 20 mL

## 2014-03-20 MED ORDER — OXYCODONE HCL 5 MG/5ML PO SOLN: 5.0000 mg | Freq: Once | ORAL | Status: DC | PRN

## 2014-03-20 MED ORDER — OXYCODONE HCL 5 MG PO TABS: 5.0000 mg | ORAL_TABLET | Freq: Once | ORAL | Status: DC | PRN

## 2014-03-20 MED ORDER — ROCURONIUM BROMIDE 100 MG/10ML IV SOLN
INTRAVENOUS | Status: DC | PRN
  Administered 2014-03-20: 30 mg via INTRAVENOUS
  Administered 2014-03-20: 10 mg via INTRAVENOUS
  Administered 2014-03-20 (×2): 5 mg via INTRAVENOUS

## 2014-03-20 MED ORDER — SUCCINYLCHOLINE CHLORIDE 20 MG/ML IJ SOLN
INTRAMUSCULAR | Status: DC | PRN
  Administered 2014-03-20: 100 mg via INTRAVENOUS

## 2014-03-20 MED ORDER — HEMOSTATIC AGENTS (NO CHARGE) OPTIME
TOPICAL | Status: DC | PRN
  Administered 2014-03-20: 1 via TOPICAL

## 2014-03-20 MED ORDER — NEOSTIGMINE METHYLSULFATE 1 MG/ML IJ SOLN
INTRAMUSCULAR | Status: AC
  Filled 2014-03-20: qty 10

## 2014-03-20 MED ORDER — ROCURONIUM BROMIDE 50 MG/5ML IV SOLN
INTRAVENOUS | Status: AC
  Filled 2014-03-20: qty 1

## 2014-03-20 MED ORDER — MIDAZOLAM HCL 2 MG/2ML IJ SOLN
INTRAMUSCULAR | Status: AC
  Filled 2014-03-20: qty 2

## 2014-03-20 MED ORDER — MORPHINE SULFATE (PF) 1 MG/ML IV SOLN
INTRAVENOUS | Status: DC
  Administered 2014-03-20: 15:00:00 via INTRAVENOUS
  Administered 2014-03-21: 2 mL via INTRAVENOUS
  Administered 2014-03-21 (×2): via INTRAVENOUS
  Administered 2014-03-21: 4 mg via INTRAVENOUS
  Administered 2014-03-22: 3 mL via INTRAVENOUS
  Administered 2014-03-22: 05:00:00 via INTRAVENOUS
  Filled 2014-03-20 (×3): qty 25

## 2014-03-20 MED ORDER — FENTANYL CITRATE 0.05 MG/ML IJ SOLN
INTRAMUSCULAR | Status: AC
  Filled 2014-03-20: qty 5

## 2014-03-20 MED ORDER — HYDROMORPHONE HCL PF 1 MG/ML IJ SOLN
INTRAMUSCULAR | Status: AC
  Filled 2014-03-20: qty 2

## 2014-03-20 MED ORDER — BUPIVACAINE-EPINEPHRINE (PF) 0.25% -1:200000 IJ SOLN
INTRAMUSCULAR | Status: AC
  Filled 2014-03-20: qty 30

## 2014-03-20 MED ORDER — ONDANSETRON HCL 4 MG/2ML IJ SOLN: 4.0000 mg | Freq: Four times a day (QID) | INTRAMUSCULAR | Status: DC | PRN

## 2014-03-20 MED ORDER — PROPOFOL 10 MG/ML IV BOLUS
INTRAVENOUS | Status: DC | PRN
  Administered 2014-03-20: 180 mg via INTRAVENOUS
  Administered 2014-03-20: 20 mg via INTRAVENOUS

## 2014-03-20 MED ORDER — NALOXONE HCL 0.4 MG/ML IJ SOLN: 0.4000 mg | INTRAMUSCULAR | Status: DC | PRN

## 2014-03-20 MED ORDER — DIPHENHYDRAMINE HCL 12.5 MG/5ML PO ELIX
12.5000 mg | ORAL_SOLUTION | Freq: Four times a day (QID) | ORAL | Status: DC | PRN
  Filled 2014-03-20: qty 5

## 2014-03-20 MED ORDER — DEXTROSE 5 % IV SOLN
INTRAVENOUS | Status: DC
  Administered 2014-03-20 – 2014-03-25 (×6): via INTRAVENOUS
  Filled 2014-03-20 (×16): qty 150

## 2014-03-20 MED ORDER — FENTANYL CITRATE 0.05 MG/ML IJ SOLN
INTRAMUSCULAR | Status: AC
Start: 2014-03-20 — End: 2014-03-20
  Filled 2014-03-20: qty 5

## 2014-03-20 MED ORDER — MORPHINE SULFATE (PF) 1 MG/ML IV SOLN
INTRAVENOUS | Status: AC
  Administered 2014-03-20: 18:00:00
  Filled 2014-03-20: qty 25

## 2014-03-20 MED ORDER — SODIUM CHLORIDE 0.9 % IV SOLN
INTRAVENOUS | Status: DC | PRN
  Administered 2014-03-20: 12:00:00 via INTRAVENOUS

## 2014-03-20 MED ORDER — NEOSTIGMINE METHYLSULFATE 1 MG/ML IJ SOLN
INTRAMUSCULAR | Status: DC | PRN
  Administered 2014-03-20: 5 mg via INTRAVENOUS

## 2014-03-20 SURGICAL SUPPLY — 57 items
APPLIER CLIP 5 13 M/L LIGAMAX5 (MISCELLANEOUS) ×3
BANDAGE ADHESIVE 1X3 (GAUZE/BANDAGES/DRESSINGS) ×12 IMPLANT
BENZOIN TINCTURE PRP APPL 2/3 (GAUZE/BANDAGES/DRESSINGS) ×3 IMPLANT
CANISTER SUCTION 2500CC (MISCELLANEOUS) ×3 IMPLANT
CHLORAPREP W/TINT 26ML (MISCELLANEOUS) ×3 IMPLANT
CLIP APPLIE 5 13 M/L LIGAMAX5 (MISCELLANEOUS) ×2 IMPLANT
CONT SPEC STER OR (MISCELLANEOUS) ×6 IMPLANT
COVER MAYO STAND STRL (DRAPES) IMPLANT
COVER SURGICAL LIGHT HANDLE (MISCELLANEOUS) ×3 IMPLANT
DECANTER SPIKE VIAL GLASS SM (MISCELLANEOUS) ×3 IMPLANT
DRAPE C-ARM 42X72 X-RAY (DRAPES) ×3 IMPLANT
DRAPE UTILITY 15X26 W/TAPE STR (DRAPE) ×6 IMPLANT
DRESSING TELFA 8X10 (GAUZE/BANDAGES/DRESSINGS) ×3 IMPLANT
DRSG COVADERM 4X10 (GAUZE/BANDAGES/DRESSINGS) ×3 IMPLANT
ELECT BLADE 6.5 EXT (BLADE) ×3 IMPLANT
ELECT CAUTERY BLADE 6.4 (BLADE) ×3 IMPLANT
ELECT REM PT RETURN 9FT ADLT (ELECTROSURGICAL) ×3
ELECTRODE REM PT RTRN 9FT ADLT (ELECTROSURGICAL) ×2 IMPLANT
GLOVE BIO SURGEON STRL SZ7 (GLOVE) ×3 IMPLANT
GLOVE BIO SURGEON STRL SZ7.5 (GLOVE) ×3 IMPLANT
GLOVE BIOGEL PI IND STRL 7.0 (GLOVE) ×6 IMPLANT
GLOVE BIOGEL PI IND STRL 7.5 (GLOVE) ×2 IMPLANT
GLOVE BIOGEL PI IND STRL 8 (GLOVE) ×2 IMPLANT
GLOVE BIOGEL PI INDICATOR 7.0 (GLOVE) ×3
GLOVE BIOGEL PI INDICATOR 7.5 (GLOVE) ×1
GLOVE BIOGEL PI INDICATOR 8 (GLOVE) ×1
GLOVE ECLIPSE 7.5 STRL STRAW (GLOVE) ×3 IMPLANT
GLOVE SURG SIGNA 7.5 PF LTX (GLOVE) ×3 IMPLANT
GLOVE SURG SS PI 7.0 STRL IVOR (GLOVE) ×6 IMPLANT
GOWN STRL REUS W/ TWL LRG LVL3 (GOWN DISPOSABLE) ×8 IMPLANT
GOWN STRL REUS W/ TWL XL LVL3 (GOWN DISPOSABLE) ×2 IMPLANT
GOWN STRL REUS W/TWL LRG LVL3 (GOWN DISPOSABLE) ×4
GOWN STRL REUS W/TWL XL LVL3 (GOWN DISPOSABLE) ×1
HEMOSTAT SNOW SURGICEL 2X4 (HEMOSTASIS) ×6 IMPLANT
KIT BASIN OR (CUSTOM PROCEDURE TRAY) ×3 IMPLANT
KIT ROOM TURNOVER OR (KITS) ×3 IMPLANT
NEEDLE BIOPSY 14X6 SOFT TISS (NEEDLE) ×3 IMPLANT
NS IRRIG 1000ML POUR BTL (IV SOLUTION) ×3 IMPLANT
PAD ARMBOARD 7.5X6 YLW CONV (MISCELLANEOUS) ×3 IMPLANT
PENCIL BUTTON HOLSTER BLD 10FT (ELECTRODE) ×3 IMPLANT
POUCH SPECIMEN RETRIEVAL 10MM (ENDOMECHANICALS) IMPLANT
SCISSORS LAP 5X35 DISP (ENDOMECHANICALS) ×3 IMPLANT
SET CHOLANGIOGRAPH 5 50 .035 (SET/KITS/TRAYS/PACK) IMPLANT
SET IRRIG TUBING LAPAROSCOPIC (IRRIGATION / IRRIGATOR) ×3 IMPLANT
SLEEVE ENDOPATH XCEL 5M (ENDOMECHANICALS) ×6 IMPLANT
SPECIMEN JAR SMALL (MISCELLANEOUS) ×3 IMPLANT
SPONGE LAP 18X18 X RAY DECT (DISPOSABLE) ×6 IMPLANT
SUT MON AB 4-0 PC3 18 (SUTURE) ×3 IMPLANT
SUT PDS AB 1 TP1 96 (SUTURE) ×6 IMPLANT
TOWEL OR 17X24 6PK STRL BLUE (TOWEL DISPOSABLE) ×3 IMPLANT
TOWEL OR 17X26 10 PK STRL BLUE (TOWEL DISPOSABLE) ×3 IMPLANT
TRAY LAPAROSCOPIC (CUSTOM PROCEDURE TRAY) ×3 IMPLANT
TROCAR XCEL BLUNT TIP 100MML (ENDOMECHANICALS) ×3 IMPLANT
TROCAR XCEL NON-BLD 5MMX100MML (ENDOMECHANICALS) ×3 IMPLANT
TUBE CONNECTING 20X1/4 (TUBING) ×3 IMPLANT
WATER STERILE IRR 1000ML POUR (IV SOLUTION) IMPLANT
YANKAUER SUCT BULB TIP NO VENT (SUCTIONS) ×6 IMPLANT

## 2014-03-20 NOTE — Progress Notes (Signed)
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 Denied nausea, fever or chills. In the ED, CT scan of the abdomen showed no active complications from specific biopsy site but did show extensive upper abdominal and retroperitoneal adenopathy concerning for lymphoma. There is also mild distended gallbladder gallbladder thickening.? Lingular pneumonia versus atelectasis. Right adrenal adenoma. Creatinine at 7.6. Baseline seems to be between 5 and 7. Calcium 12.2. Alkaline phosphatase 143. Lipase within normal limits at 52. Platelet count is 56. Hemodynamically stable. Afebrile. Satting 99-100% on room air.   Assessment/Plan:  1. RUQ pain: Possible acute cholecystitis. Treated supportively with bowel rest, IV fluids, IV antibiotics (Levaquin and Flagyl) and pain management. GI, surgery and hematology consulted. Plan is to proceed for laparoscopic/open cholecystectomy with lymph node biopsy and liver biopsy on 4/23, after platelet transfusion. 2. 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 Management per GI-consultation appreciated. Patient has lymphadenopathy on CT abdomen but according to IR cannot be accessed by percutaneous route. Plan for repeat liver biopsy during laparoscopy or exploratory laparotomy on 4/23. 3. CKD 5/metabolic acidosis: Not started dialysis yet. Apparently transplant candidate. Nephrology consultation and followup appreciated. 4. Anemia: Multifactorial secondary to  ESRD, splenomegaly and chronic disease. Stable. Transfuse if hemoglobin less than 7 g per DL. 5. Thrombocytopenia: Patient seems to have chronic thrombocytopenia but has dropped from 122 on 4/15 to 56 on 4/20. Platelet counts better..Secondary to splenomegaly and sequestration. Hematology consultation appreciated-recommend transfusing platelets on way to the operating room to ensure platelet count close to 70,000. 6. Intra-abdominal/retroperitoneal lymphadenopathy: Unclear etiology. DD-reactive, lymphoma, metastatic carcinoma, sarcoid. As per IR, unable to percutaneous biopsy. Surgery plans for excisional biopsy during laparoscopy/laparotomy on 4/23. 7. 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 Continue IV fluids. Await lymph node biopsy. ACE level 98. Intact PTH 9.2. Calcitonin nasal spray. 8. Hypertension: Controlled  Code Status: Full Family Communication: None at bedside Disposition Plan: Remains inpatient   Consultants:  Gastroenterology  General surgery  Nephrology  Hematology  Procedures:  None  Antibiotics:  IV levofloxacin  IV Flagyl   Subjective: Persisting right upper quadrant pain. Patient seen this morning prior to surgery  Objective: Filed Vitals:   03/19/14 1250 03/19/14 1740 03/19/14 2139 03/20/14 0605  BP: 134/80 150/78 148/89 137/75  Pulse: 82 75 82 85  Temp: 97.8 F (36.6 C) 97.8 F (36.6 C) 98.3 F (36.8 C) 97.5 F (36.4 C)  TempSrc: Oral Oral Oral Oral  Resp: 18 18 19 18   Height:   6\' 4"  (1.93 m)   Weight:   131.57 kg (290 lb 1 oz)   SpO2: 100% 100% 100% 100%    Intake/Output Summary (Last 24 hours) at 03/20/14 1318 Last data filed at 03/20/14 1306  Gross per 24 hour  Intake    779 ml  Output    200 ml  Net    579 ml   Filed Weights   03/18/14 2134 03/19/14 0406 03/19/14 2139  Weight: 130.75 kg (288 lb 4 oz) 131.362 kg (289 lb 9.6 oz) 131.57 kg (290 lb 1 oz)     Exam:  General exam: Moderately built and obese  male  lying comfortably in bed. Respiratory system: Clear. No increased work of breathing. Cardiovascular system: S1 & S2 heard, RRR. No JVD, murmurs, gallops, clicks. Trace bilateral ankle edema. Telemetry: Sinus rhythm-telemetry discontinued 4/23. Gastrointestinal system: Abdomen is obese with mild RUQ tenderness without rigidity or rebound. Abdomen soft.. Normal bowel sounds heard. Central nervous system: Alert and oriented. No focal neurological deficits. Extremities: Symmetric 5 x 5 power.   Data Reviewed: Basic Metabolic Panel:  Recent Labs Lab 03/17/14 1430 03/17/14 1932 03/18/14 1105 03/19/14 0711 03/20/14 0713  NA 138  --  137 140 139  K 4.6  --  4.5 4.8 4.4  CL 99  --  99 103 104  CO2 19  --  16* 17* 16*  GLUCOSE 116*  --  103* 97 100*  BUN 72*  --  72* 76* 74*  CREATININE 7.86*  --  7.52* 7.85* 7.29*  CALCIUM 12.2* 11.1* 11.2* 11.0* 10.5  MG  --   --   --  2.5  --   PHOS  --   --   --  6.7*  --    Liver Function Tests:  Recent Labs Lab 03/17/14 1430 03/18/14 1105 03/19/14 0711 03/20/14 0713  AST 21 41* 29 22  ALT 29 37 31 25  ALKPHOS 143* 229* 195* 164*  BILITOT 0.5 0.9 0.5 0.4  PROT 9.1* 8.2 8.1 7.9  ALBUMIN 4.2 3.7 3.7 3.7    Recent Labs Lab 03/17/14 1430  LIPASE 52   No results found for this basename: AMMONIA,  in the last 168 hours CBC:  Recent Labs Lab 03/17/14 1517 03/18/14 1105 03/19/14 0711 03/20/14 0713  WBC 4.5 4.8 3.7* 3.6*  NEUTROABS 3.3 3.3 2.6 2.7  HGB 10.5* 9.5* 9.0* 8.9*  HCT 30.0* 27.4* 26.5* 26.2*  MCV 92.3 93.2 94.3 94.2  PLT 56* 55* 53* 69*   Cardiac Enzymes: No results found for this basename: CKTOTAL, CKMB, CKMBINDEX, TROPONINI,  in the last 168 hours BNP (last 3 results) No results found for this basename: PROBNP,  in the last 8760 hours CBG: No results found for this basename: GLUCAP,  in the last 168 hours  Recent Results (from the past 240 hour(s))  SURGICAL PCR SCREEN     Status: Abnormal   Collection Time     03/20/14  9:35 AM      Result Value Ref Range Status   MRSA, PCR NEGATIVE  NEGATIVE Final   Staphylococcus aureus POSITIVE (*) NEGATIVE Final   Comment:            The Xpert SA Assay (FDA     approved for NASAL specimens     in patients over 56 years of age),     is one component of     a comprehensive surveillance     program.  Test performance has     been validated by [employer] for patients greater     than or equal to 50 year old.     It is not intended     to diagnose infection nor to     guide or monitor treatment.       Studies: No results found.      Scheduled Meds: . [MAR HOLD] amLODipine  5 mg Oral Daily  . [facility] HOLD] antiseptic oral rinse  15 mL Mouth Rinse BID  . [MAR HOLD] calcitonin (salmon)  1 spray Alternating Nares Daily  . [facility] HOLD] cyanocobalamin  250 mcg Oral  Daily  . [MAR HOLD] levofloxacin (LEVAQUIN) IV  500 mg Intravenous Q48H  . [facility] HOLD] metronidazole  500 mg Intravenous Q8H  . [MAR HOLD] pantoprazole (PROTONIX) IV  40 mg Intravenous Q12H  . [facility] HOLD] sevelamer carbonate  800 mg Oral TID WC  . [facility] HOLD] sodium chloride  3 mL Intravenous Q12H   Continuous Infusions: . sodium chloride 75 mL/hr at 03/19/14 1905    Active Problems:   Lymphoma   Cholecystitis, acute   Chronic kidney disease, stage V   Anemia   Thrombocytopenia, unspecified   Abdominal lymphadenopathy    Time spent: 25 minutes.    Modena Jansky, MD, FACP, [facility]. [facility] Pager [phone]  If 7PM-7AM, please contact night-coverage [email or web address] Password [email or web address] 03/20/2014, 1:18 PM    LOS: 3 days

## 2014-03-20 NOTE — Anesthesia Postprocedure Evaluation (Signed)
  Anesthesia Post-op Note  Patient: WINFIELD SVETLIK  Procedure(s) Performed: Procedure(s): LAPAROSCOPIC CHOLECYSTECTOMY (N/A) INTRA-ABDOMINAL LYMPH NODE BIOPSY (N/A) TRU CUT LIVER BIOPSY (N/A)  Patient Location: PACU  Anesthesia Type:General  Level of Consciousness: awake and alert   Airway and Oxygen Therapy: Patient Spontanous Breathing  Post-op Pain: moderate  Post-op Assessment: Post-op Vital signs reviewed, Patient's Cardiovascular Status Stable and Respiratory Function Stable  Post-op Vital Signs: Reviewed  Filed Vitals:   03/20/14 1515  BP: 171/96  Pulse: 108  Temp:   Resp: 22    Complications: No apparent anesthesia complications

## 2014-03-20 NOTE — Progress Notes (Signed)
Patient ID: Steven Best, male   DOB: 22-May-1966, 48 y.o.   MRN: [id number] S:Patient seen in PACU S/p cholecystectomy, liver biopsy and perigastric node resection; liver noted to be "thick, hardened, cirrhotic" and had acute cholecystitis with gangrenous changes Having a fair amount of post op pain Nurses note he has been mildly tachypneic despite excellent O2 sats and good end tidal CO2 Has not voided in PACU  O:BP 171/96  Pulse 108  Temp(Src) 97.6 F (36.4 C) (Oral)  Resp 22  Ht 6\' 4"  (1.93 m)  Wt 131.57 kg (290 lb 1 oz)  BMI 35.32 kg/m2  SpO2 99%  Intake/Output Summary (Last 24 hours) at 03/20/14 1522 Last data filed at 03/20/14 1345  Gross per 24 hour  Intake    779 ml  Output    250 ml  Net    529 ml   Intake/Output: I/O last 3 completed shifts: In: 120 [P.O.:120] Out: -   Intake/Output this shift:  Total I/O In: 779 [I.V.:500; Blood:279] Out: 250 [Blood:250] Weight change: 0.82 kg (1 lb 12.9 oz) [id number] WM, lying in bed, in pain [id number] ~110  S1S2 no S3' no pericardial rub Resp:RR 20 Mildly tachypneic and hyperpneic but lung fields entirely clear [id number] intact; mod diffuse tenderness; quiet Ext:L AVF with + bruit; no LE edema; SCD's in place   Recent Labs Lab 03/17/14 1430 03/17/14 1932 03/18/14 1105 03/19/14 0711 03/20/14 0713  NA 138  --  137 140 139  K 4.6  --  4.5 4.8 4.4  CL 99  --  99 103 104  CO2 19  --  16* 17* 16*  GLUCOSE 116*  --  103* 97 100*  BUN 72*  --  72* 76* 74*  CREATININE 7.86*  --  7.52* 7.85* 7.29*  ALBUMIN 4.2  --  3.7 3.7 3.7  CALCIUM 12.2* 11.1* 11.2* 11.0* 10.5  PHOS  --   --   --  6.7*  --   AST 21  --  41* 29 22  ALT 29  --  37 31 25   Liver Function Tests:  Recent Labs Lab 03/18/14 1105 03/19/14 0711 03/20/14 0713  AST 41* 29 22  ALT 37 31 25  ALKPHOS 229* 195* 164*  BILITOT 0.9 0.5 0.4  PROT 8.2 8.1 7.9  ALBUMIN 3.7 3.7 3.7    Recent Labs Lab 03/17/14 1430  LIPASE 52    Recent  Labs Lab 03/17/14 1517 03/18/14 1105 03/19/14 0711 03/20/14 0713  WBC 4.5 4.8 3.7* 3.6*  NEUTROABS 3.3 3.3 2.6 2.7  HGB 10.5* 9.5* 9.0* 8.9*  HCT 30.0* 27.4* 26.5* 26.2*  MCV 92.3 93.2 94.3 94.2  PLT 56* 55* 53* 69*   Iron Studies:   Recent Labs  03/18/14 1100  IRON 69  TIBC 288   Studies/Results: No results found. [facility] HOLD] amLODipine  5 mg Oral Daily  . [facility] HOLD] antiseptic oral rinse  15 mL Mouth Rinse BID  . [MAR HOLD] calcitonin (salmon)  1 spray Alternating Nares Daily  . [facility] HOLD] cyanocobalamin  250 mcg Oral Daily  . HYDROmorphone      . [MAR HOLD] levofloxacin (LEVAQUIN) IV  500 mg Intravenous Q48H  . [facility] HOLD] metronidazole  500 mg Intravenous Q8H  . morphine   Intravenous 6 times per day  . morphine      . [MAR HOLD] pantoprazole (PROTONIX) IV  40 mg Intravenous Q12H  . [facility] HOLD] sevelamer carbonate  800 mg Oral TID WC  . [  MAR HOLD] sodium chloride  3 mL Intravenous Q12H    BMET    Component Value Date/Time   NA 139 03/20/2014 0713   NA 137 05/01/2013 1033   K 4.4 03/20/2014 0713   K 4.5 05/01/2013 1033   CL 104 03/20/2014 0713   CL 105 05/01/2013 1033   CO2 16* 03/20/2014 0713   CO2 17* 05/01/2013 1033   GLUCOSE 100* 03/20/2014 0713   GLUCOSE 89 05/01/2013 1033   BUN 74* 03/20/2014 0713   BUN 85.7* 05/01/2013 1033   CREATININE 7.29* 03/20/2014 0713   CREATININE 6.6 Repeated and Verified* 05/01/2013 1033   CALCIUM 10.5 03/20/2014 0713   CALCIUM 11.1* 03/17/2014 1932   CALCIUM 9.9 05/01/2013 1033   GFRNONAA 8* 03/20/2014 0713   GFRAA 9* 03/20/2014 0713   CBC    Component Value Date/Time   WBC 3.6* 03/20/2014 0713   WBC 3.2* 05/01/2013 1033   RBC 2.78* 03/20/2014 0713   RBC 3.26* 05/01/2013 1033   HGB 8.9* 03/20/2014 0713   HGB 11.0* 05/01/2013 1033   HCT 26.2* 03/20/2014 0713   HCT 32.0* 05/01/2013 1033   PLT 69* 03/20/2014 0713   PLT 110* 05/01/2013 1033   MCV 94.2 03/20/2014 0713   MCV 98.1* 05/01/2013 1033   MCH 32.0 03/20/2014 0713   MCH 33.8* 05/01/2013 1033   MCHC  34.0 03/20/2014 0713   MCHC 34.4 05/01/2013 1033   RDW 13.4 03/20/2014 0713   RDW 13.1 05/01/2013 1033   LYMPHSABS 0.4* 03/20/2014 0713   LYMPHSABS 0.4* 05/01/2013 1033   MONOABS 0.3 03/20/2014 0713   MONOABS 0.5 05/01/2013 1033   EOSABS 0.2 03/20/2014 0713   EOSABS 0.1 05/01/2013 1033   BASOSABS 0.0 03/20/2014 0713   BASOSABS 0.0 05/01/2013 1033   Results for KEYJUAN, TIANO (MRN [id number]) as of 03/20/2014 15:22  Ref. Range 03/19/2014 16:04  Angiotensin-Converting Enzyme Latest Range: 8-52 U/L 98 (H)  Results for KASEN, RAMMER (MRN [id number]) as of 03/20/2014 15:22  Ref. Range 03/17/2014 19:32  PTH Latest Range: 14.0-72.0 pg/mL 9.2 (L)   Assessment/Plan:  1. AKI/CKD- in setting of decreased po intake and likely ischemic ATN, also with hypercalcemia.  No evidence of uremia but will cont to follow closely. 2. Metabolic acidosis - probably driving his tachypnea and hyperpnea in absence of any evidence for volume overload.  Change IVF to isotonic sodium bicarbonate at 75/hour; no UOP recorded today - bladder scan.  3. Acute cholecystits- s/p lap chole (plus liver bx, node resection) Path pending 4. Hypercalcemia- ?sarcoidosis vs. Malignancy.  Surgical path pending.  ACE level elevated (and PTH suppressed) putting sarcoidosis higher on the DDX for his hypercalcemia.   Continue calcitonin nasal spray. If sarcoid would require steroids for control of hypercalcemia - empiric not desirable w/fresh post op/cholecystitis - wait on path 5. Thrombocytopenia- per Heme/Onc, possibly related to splenomegaly 6. Cirrhosis- c/b thrombocytopenia, splenomegaly; repeat bx done today - path pending 7. Diffuse intra-abdominal retroperitoneal lymphadenopathy- a perigastric node was removed 8. Vascular access- LAVF mature and ready for use if needed  Lucrezia Starch

## 2014-03-20 NOTE — Op Note (Signed)
LAPAROSCOPIC CHOLECYSTECTOMY, INTRA-ABDOMINAL LYMPH NODE BIOPSY, TRU CUT LIVER BIOPSY  Procedure Note  Steven Best 03/17/2014 - 03/20/2014   Pre-op Diagnosis: cholecystitis, lymphadenopathy, thrombocytopenia     Post-op Diagnosis: same  Procedure(s): LAPAROSCOPIC CHOLECYSTECTOMY EXPLORATORY LAPAROTOMY INTRA-ABDOMINAL LYMPH NODE BIOPSY TRU CUT LIVER BIOPSY  Surgeon(s): Harl Bowie, MD  Anesthesia: General  Staff:  Circulator: Cyd Silence, RN Relief Circulator: Denna Haggard, RN Scrub Person: Carlisle Beers, CST; Leslie Andrea, CST RN First Assistant: Quincy Carnes, RN  Estimated Blood Loss: Minimal               Specimens: liver biopsy, gallbladder, perigastric lymph node (greater curve)  Findings:  Thick, hardened, cirrhotic liver.  Acute cholecystitis with gangrenous changes.  Inflammation around the porta but no nodes that could be safely biopsied.          Harl Bowie   Date: 03/20/2014  Time: 2:15 PM

## 2014-03-20 NOTE — Transfer of Care (Signed)
Immediate Anesthesia Transfer of Care Note  Patient: Steven Best  Procedure(s) Performed: Procedure(s): LAPAROSCOPIC CHOLECYSTECTOMY (N/A) INTRA-ABDOMINAL LYMPH NODE BIOPSY (N/A) TRU CUT LIVER BIOPSY (N/A)  Patient Location: PACU  Anesthesia Type:General  Level of Consciousness: awake, alert  and oriented  Airway & Oxygen Therapy: Patient Spontanous Breathing and Patient connected to face mask oxygen  Post-op Assessment: Report given to PACU RN  Post vital signs: Reviewed and stable  Complications: No apparent anesthesia complications

## 2014-03-20 NOTE — Anesthesia Preprocedure Evaluation (Addendum)
Anesthesia Evaluation  Patient identified by MRN, date of birth, ID band Patient awake    Reviewed: Allergy & Precautions, H&P , NPO status , Patient's Chart, lab work & pertinent test results  History of Anesthesia Complications Negative for: history of anesthetic complications  Airway Mallampati: II TM Distance: >3 FB Neck ROM: Full    Dental no notable dental hx. (+) Teeth Intact, Dental Advisory Given   Pulmonary neg pulmonary ROS, former smoker,  breath sounds clear to auscultation  Pulmonary exam normal       Cardiovascular hypertension, On Medications Rhythm:Regular Rate:Normal     Neuro/Psych  Headaches, Anxiety    GI/Hepatic Neg liver ROS, GERD-  Controlled,  Endo/Other  negative endocrine ROS  Renal/GU Renal disease  negative genitourinary   Musculoskeletal   Abdominal   Peds  Hematology negative hematology ROS (+) anemia ,   Anesthesia Other Findings   Reproductive/Obstetrics negative OB ROS                          Anesthesia Physical Anesthesia Plan  ASA: III  Anesthesia Plan: General   Post-op Pain Management:    Induction: Intravenous  Airway Management Planned: Oral ETT  Additional Equipment:   Intra-op Plan:   Post-operative Plan: Extubation in OR  Informed Consent: I have reviewed the patients History and Physical, chart, labs and discussed the procedure including the risks, benefits and alternatives for the proposed anesthesia with the patient or authorized representative who has indicated his/her understanding and acceptance.   Dental advisory given  Plan Discussed with: CRNA  Anesthesia Plan Comments:         Anesthesia Quick Evaluation

## 2014-03-21 DIAGNOSIS — I10 Essential (primary) hypertension: Secondary | ICD-10-CM

## 2014-03-21 DIAGNOSIS — N179 Acute kidney failure, unspecified: Secondary | ICD-10-CM

## 2014-03-21 LAB — COMPREHENSIVE METABOLIC PANEL
ALBUMIN: 3.4 g/dL — AB (ref 3.5–5.2)
ALK PHOS: 133 U/L — AB (ref 39–117)
ALT: 31 U/L (ref 0–53)
AST: 40 U/L — ABNORMAL HIGH (ref 0–37)
BILIRUBIN TOTAL: 0.5 mg/dL (ref 0.3–1.2)
BUN: 73 mg/dL — ABNORMAL HIGH (ref 6–23)
CO2: 17 mEq/L — ABNORMAL LOW (ref 19–32)
Calcium: 9.8 mg/dL (ref 8.4–10.5)
Chloride: 104 mEq/L (ref 96–112)
Creatinine, Ser: 7.44 mg/dL — ABNORMAL HIGH (ref 0.50–1.35)
GFR calc Af Amer: 9 mL/min — ABNORMAL LOW (ref >90)
GFR calc non Af Amer: 8 mL/min — ABNORMAL LOW (ref >90)
Glucose, Bld: 142 mg/dL — ABNORMAL HIGH (ref 70–99)
POTASSIUM: 4.6 meq/L (ref 3.7–5.3)
Sodium: 141 mEq/L (ref 137–147)
TOTAL PROTEIN: 7.5 g/dL (ref 6.0–8.3)

## 2014-03-21 LAB — VITAMIN D 1,25 DIHYDROXY
VITAMIN D 1, 25 (OH) TOTAL: 37 pg/mL (ref 18–72)
VITAMIN D3 1, 25 (OH): 37 pg/mL
Vitamin D2 1, 25 (OH)2: <8 pg/mL

## 2014-03-21 LAB — CBC WITH DIFFERENTIAL/PLATELET
BASOS ABS: 0 10*3/uL (ref 0.0–0.1)
Basophils Relative: 0 % (ref 0–1)
EOS PCT: 0 % (ref 0–5)
Eosinophils Absolute: 0 10*3/uL (ref 0.0–0.7)
HEMATOCRIT: 28.1 % — AB (ref 39.0–52.0)
Hemoglobin: 9.6 g/dL — ABNORMAL LOW (ref 13.0–17.0)
Lymphocytes Relative: 3 % — ABNORMAL LOW (ref 12–46)
Lymphs Abs: 0.3 10*3/uL — ABNORMAL LOW (ref 0.7–4.0)
MCH: 32.4 pg (ref 26.0–34.0)
MCHC: 34.2 g/dL (ref 30.0–36.0)
MCV: 94.9 fL (ref 78.0–100.0)
MONO ABS: 1.1 10*3/uL — AB (ref 0.1–1.0)
Monocytes Relative: 10 % (ref 3–12)
Neutro Abs: 9.5 10*3/uL — ABNORMAL HIGH (ref 1.7–7.7)
Neutrophils Relative %: 87 % — ABNORMAL HIGH (ref 43–77)
PLATELETS: 118 10*3/uL — AB (ref 150–400)
RBC: 2.96 MIL/uL — ABNORMAL LOW (ref 4.22–5.81)
RDW: 13.7 % (ref 11.5–15.5)
WBC: 10.9 10*3/uL — ABNORMAL HIGH (ref 4.0–10.5)

## 2014-03-21 LAB — PREPARE PLATELET PHERESIS: UNIT DIVISION: 0

## 2014-03-21 LAB — PHOSPHORUS: Phosphorus: 8.1 mg/dL — ABNORMAL HIGH (ref 2.3–4.6)

## 2014-03-21 MED ORDER — AMLODIPINE BESYLATE 10 MG PO TABS
10.0000 mg | ORAL_TABLET | Freq: Every day | ORAL | Status: DC
  Administered 2014-03-22 – 2014-03-25 (×4): 10 mg via ORAL
  Filled 2014-03-21 (×4): qty 1

## 2014-03-21 NOTE — Progress Notes (Signed)
Patient ID: Steven Best, male   DOB: June 14, 1966, 48 y.o.   MRN: [id number] S:"feels like I was hit by a truck" O:BP 160/90  Pulse 100  Temp(Src) 98 F (36.7 C) (Oral)  Resp 20  Ht 6\' 4"  (1.93 m)  Wt 132.3 kg (291 lb 10.7 oz)  BMI 35.52 kg/m2  SpO2 98%  Intake/Output Summary (Last 24 hours) at 03/21/14 1225 Last data filed at 03/21/14 0800  Gross per 24 hour  Intake    779 ml  Output    500 ml  Net    279 ml   Intake/Output: I/O last 3 completed shifts: In: 779 [I.V.:500; Blood:279] Out: 350 [Urine:100; Blood:250]  Intake/Output this shift:  Total I/O In: 0  Out: 150 [Urine:150] Weight change: 0.73 kg (1 lb 9.8 oz) Gen:WD obese WM in NAD CVS:no rub Resp:cta [id number], hypoactive BS, tender  Ext:no edema, LAVF +T/B   Recent Labs Lab 03/17/14 1430 03/17/14 1932 03/18/14 1105 03/19/14 0711 03/20/14 0713 03/21/14 0440  NA 138  --  137 140 139 141  K 4.6  --  4.5 4.8 4.4 4.6  CL 99  --  99 103 104 104  CO2 19  --  16* 17* 16* 17*  GLUCOSE 116*  --  103* 97 100* 142*  BUN 72*  --  72* 76* 74* 73*  CREATININE 7.86*  --  7.52* 7.85* 7.29* 7.44*  ALBUMIN 4.2  --  3.7 3.7 3.7 3.4*  CALCIUM 12.2* 11.1* 11.2* 11.0* 10.5 9.8  PHOS  --   --   --  6.7*  --  8.1*  AST 21  --  41* 29 22 40*  ALT 29  --  37 31 25 31    Liver Function Tests:  Recent Labs Lab 03/19/14 0711 03/20/14 0713 03/21/14 0440  AST 29 22 40*  ALT 31 25 31   ALKPHOS 195* 164* 133*  BILITOT 0.5 0.4 0.5  PROT 8.1 7.9 7.5  ALBUMIN 3.7 3.7 3.4*    Recent Labs Lab 03/17/14 1430  LIPASE 52   No results found for this basename: AMMONIA,  in the last 168 hours CBC:  Recent Labs Lab 03/17/14 1517 03/18/14 1105 03/19/14 0711 03/20/14 0713 03/21/14 0440  WBC 4.5 4.8 3.7* 3.6* 10.9*  NEUTROABS 3.3 3.3 2.6 2.7 9.5*  HGB 10.5* 9.5* 9.0* 8.9* 9.6*  HCT 30.0* 27.4* 26.5* 26.2* 28.1*  MCV 92.3 93.2 94.3 94.2 94.9  PLT 56* 55* 53* 69* 118*   Cardiac Enzymes: No results found for this  basename: CKTOTAL, CKMB, CKMBINDEX, TROPONINI,  in the last 168 hours CBG: No results found for this basename: GLUCAP,  in the last 168 hours  Iron Studies: No results found for this basename: IRON, TIBC, TRANSFERRIN, FERRITIN,  in the last 72 hours Studies/Results: No results found. [facility] amLODipine  5 mg Oral Daily  . antiseptic oral rinse  15 mL Mouth Rinse BID  . calcitonin (salmon)  1 spray Alternating Nares Daily  . cyanocobalamin  250 mcg Oral Daily  . levofloxacin (LEVAQUIN) IV  500 mg Intravenous Q48H  . metronidazole  500 mg Intravenous Q8H  . morphine   Intravenous 6 times per day  . pantoprazole (PROTONIX) IV  40 mg Intravenous Q12H  . sevelamer carbonate  800 mg Oral TID WC  . sodium chloride  3 mL Intravenous Q12H    BMET    Component Value Date/Time   NA 141 03/21/2014 0440   NA 137 05/01/2013 1033   K  4.6 03/21/2014 0440   K 4.5 05/01/2013 1033   CL 104 03/21/2014 0440   CL 105 05/01/2013 1033   CO2 17* 03/21/2014 0440   CO2 17* 05/01/2013 1033   GLUCOSE 142* 03/21/2014 0440   GLUCOSE 89 05/01/2013 1033   BUN 73* 03/21/2014 0440   BUN 85.7* 05/01/2013 1033   CREATININE 7.44* 03/21/2014 0440   CREATININE 6.6 Repeated and Verified* 05/01/2013 1033   CALCIUM 9.8 03/21/2014 0440   CALCIUM 11.1* 03/17/2014 1932   CALCIUM 9.9 05/01/2013 1033   GFRNONAA 8* 03/21/2014 0440   GFRAA 9* 03/21/2014 0440   CBC    Component Value Date/Time   WBC 10.9* 03/21/2014 0440   WBC 3.2* 05/01/2013 1033   RBC 2.96* 03/21/2014 0440   RBC 3.26* 05/01/2013 1033   HGB 9.6* 03/21/2014 0440   HGB 11.0* 05/01/2013 1033   HCT 28.1* 03/21/2014 0440   HCT 32.0* 05/01/2013 1033   PLT 118* 03/21/2014 0440   PLT 110* 05/01/2013 1033   MCV 94.9 03/21/2014 0440   MCV 98.1* 05/01/2013 1033   MCH 32.4 03/21/2014 0440   MCH 33.8* 05/01/2013 1033   MCHC 34.2 03/21/2014 0440   MCHC 34.4 05/01/2013 1033   RDW 13.7 03/21/2014 0440   RDW 13.1 05/01/2013 1033   LYMPHSABS 0.3* 03/21/2014 0440   LYMPHSABS 0.4* 05/01/2013 1033   MONOABS 1.1*  03/21/2014 0440   MONOABS 0.5 05/01/2013 1033   EOSABS 0.0 03/21/2014 0440   EOSABS 0.1 05/01/2013 1033   BASOSABS 0.0 03/21/2014 0440   BASOSABS 0.0 05/01/2013 1033     Assessment/Plan:  1. AKI/CKD- in setting of decreased po intake and likely ischemic ATN, also with hypercalcemia. No evidence of uremia but will cont to follow closely.  1. Oliguric over the last 24 hours post operatively 2. Will cont to follow closely as he may require HD soon if he does not improve UOP 3. Will check bladder scan to r/o BOO.  4. Will plan for HD tomorrow if no sig improvement of UOP and Scr 2. Metabolic acidosis - probably driving his tachypnea and hyperpnea in absence of any evidence for volume overload.  1. Changed IVF to isotonic sodium bicarbonate at 75/hour;  3. Acute cholecystits- s/p lap chole (plus liver bx, node resection) Path pending 4. Hypercalcemia- ?sarcoidosis vs. Malignancy. Surgical path pending. ACE level elevated (and PTH suppressed) putting sarcoidosis higher on the DDX for his hypercalcemia.  1. Improved with medical management 2. Continue calcitonin nasal spray.  3. If sarcoid would require steroids but not empiric due to recent surgery. 5. Thrombocytopenia- per Heme/Onc, possibly related to splenomegaly 6. Cirrhosis- c/b thrombocytopenia, splenomegaly; repeat bx done today - path pending 7. Diffuse intra-abdominal retroperitoneal lymphadenopathy- a perigastric node was removed 8. Vascular access- LAVF mature and ready for use if needed 9.   Steven Best

## 2014-03-21 NOTE — Progress Notes (Signed)
[facility] PROGRESS NOTE Subjective patient feels okay postop. Still having abdominal pain. Laparoscopic cholecystectomy yesterday with gangrenous gallbladder. Cirrhotic liver seen in biopsies of the liver obtained. No lymph nodes could be biopsies.  Objective: Vital signs in last 24 hours: Temp:  [98 F (36.7 C)-99.3 F (37.4 C)] 98 F (36.7 C) (04/24 0820) Pulse Rate:  [95-110] 100 (04/24 0820) Resp:  [20-22] 20 (04/24 1600) BP: (136-167)/(80-99) 160/90 mmHg (04/24 0820) SpO2:  [36 %-99 %] 95 % (04/24 1600) Weight:  [132.3 kg (291 lb 10.7 oz)] 132.3 kg (291 lb 10.7 oz) (04/23 2135) Last BM Date: 03/19/14  Intake/Output from previous day: 04/23 0701 - 04/24 0700 In: 779 [I.V.:500; Blood:279] Out: 350 [Urine:100; Blood:250] Intake/Output this shift: Total I/O In: 0  Out: 1550 [Urine:1550]  PE: General-- alert no acute distress  Abdomen-- quiet and tender Lab Results:  Recent Labs  03/19/14 0711 03/20/14 0713 03/21/14 0440  WBC 3.7* 3.6* 10.9*  HGB 9.0* 8.9* 9.6*  HCT 26.5* 26.2* 28.1*  PLT 53* 69* 118*   BMET  Recent Labs  03/19/14 0711 03/20/14 0713 03/21/14 0440  NA 140 139 141  K 4.8 4.4 4.6  CL 103 104 104  CO2 17* 16* 17*  CREATININE 7.85* 7.29* 7.44*   LFT  Recent Labs  03/19/14 0711 03/20/14 0713 03/21/14 0440  PROT 8.1 7.9 7.5  AST 29 22 40*  ALT 31 25 31   ALKPHOS 195* 164* 133*  BILITOT 0.5 0.4 0.5   PT/INR No results found for this basename: LABPROT, INR,  in the last 72 hours PANCREAS No results found for this basename: LIPASE,  in the last 72 hours       Studies/Results: No results found.  Medications: I have reviewed the patient's current medications.  Assessment/Plan: 1. Status post cholecystectomy for gangrenous gallbladder 2. Cirrhosis and liver biopsies pending will check those results later. Test for auto immune hepatitis, PBC etc. were all negative   Winfield Cunas. 03/21/2014, 5:06 PM

## 2014-03-21 NOTE — Progress Notes (Signed)
Pt sat up to chair for @ 1.5 hours.  Ate dinner in chair.  Pt tolerated but desated to 78% on 2 lpm via [place].  Encouraged to deep breath and came back up to 95-97%.

## 2014-03-21 NOTE — Progress Notes (Signed)
Pt diet order clarification.  Paged Dr Clementeen Graham.

## 2014-03-21 NOTE — Progress Notes (Signed)
Patient unable to void. Patient complained of bladder discomfort. Bladder distended and tender to light palpation. Bladder scan indicated 999 ml. MD order for foley catheter. 1400 urine output after catheter placement. Patient tollerated well and stated that he feels better. Will continue to monitor [place], Student-RN

## 2014-03-21 NOTE — Op Note (Signed)
NAMERICO, WECKESSER NO.:  [id number]  MEDICAL RECORD NO.:  [id number]  LOCATION:                                FACILITY:  [facility]  PHYSICIAN:  Coralie Keens, M.D. DATE OF BIRTH:  1966-03-20  DATE OF PROCEDURE:  03/20/2014 DATE OF DISCHARGE:                              OPERATIVE REPORT   PREOPERATIVE DIAGNOSES: 1. Acute cholecystitis with cholelithiasis. 2. Intraabdominal lymphadenopathy. 3. Thrombocytopenia.  POSTOPERATIVE DIAGNOSES: 1. Acute cholecystitis with cholelithiasis. 2. Intraabdominal lymphadenopathy. 3. Thrombocytopenia.  PROCEDURE: 1. Laparoscopic cholecystectomy. 2. Exploratory laparotomy. 3. Intraabdominal lymph node biopsy. 4. Tru-Cut liver biopsy.  SURGEON:  Coralie Keens, M.D.  ASSISTANT:  Dr. Sharrell Ku.  ANESTHESIA:  General endotracheal anesthesia.  ESTIMATED BLOOD LOSS:  Minimal.  INDICATIONS:  This is a 48 year old gentleman with end-stage renal disease, who is not yet on hemodialysis.  He presented with upper abdominal pain and was found to have findings consistent with acute cholecystitis and gallstones.  He had recently had a liver biopsy through a transjugular approach showing granulomatous hepatitis.  On a CAT scan, he was also found to have a large amount of intraabdominal lymphadenopathy, mostly involving the porta hepaticus.  Oncology was consulted and wanted biopsy of the lymph nodes.  Interventional Radiology could not do an interventional biopsy, therefore, decision was made to proceed to the operating room for a cholecystectomy with the hopes of biopsying the lymph node at the same time.  FINDINGS:  The patient was found to have acute gangrenous cholecystitis. He had a very hard thickened cirrhotic-appearing liver.  The procedure had to be converted to an open procedure after the cholecystectomy in order to try to biopsy the lymph nodes.  There was a lot of inflammation around the porta, but no specific  node that could be separated for biopsy.  I could also not identify node in the gastrohepatic ligament. I was able to identify a lymph node around the greater curvature of the stomach which was removed.  There was no other intraabdominal pathology other than a large amount of hard stool in the transverse colon and hepatic flexure.  PROCEDURE IN DETAIL:  The patient was brought to the operating room, identified as Steven Best.  He was placed supine on the operating room table, where general anesthesia was induced.  His abdomen was then prepped and draped in the usual sterile fashion.  I made a small incision just above the umbilicus at the scalpel.  I took this down to the fascia which was then opened with a scalpel.  A hemostat was then used to pass into the peritoneal cavity under direct vision.  A 0 Vicryl pursestring suture was then placed around the fascial opening.  The Hasson port was placed through the opening and insufflation of the abdomen was begun.  I then placed a 5-mm port in the patient's epigastrium and 2 more in the right upper quadrant all under direct vision.  The patient's liver was found to be enlarged and hardened and firm and had findings consistent with cirrhosis.  The gallbladder itself was acutely inflamed with gangrenous changes.  I was able to grasp the gallbladder and retract to above the liver bed.  Dissection was  then carried out the base of the gallbladder.  The base of the gallbladder was quite friable.  I was able to dissect out the cystic duct and achieved a critical window around it.  I clipped it 3 times proximally, once distally, and transected it.  The cystic artery to posterior branch was identified and clipped proximally, distally, and transected as well. The gallbladder was then slowly dissected free from the liver bed with the electrocautery.  Again, it was quite inflamed.  Once it was free from liver bed, I placed an Endosac and removed it  through the incision at the umbilicus.  Hemostasis appeared to be achieved in the liver bed. I then examined the porta and saw nothing that I could perform a laparoscopic lymph node biopsy on.  I also looked into the lesser curvature of the stomach and gastrohepatic ligament, and could not identify a safely lymph node as well as secondary to the extremely large left lobe of the liver.  At this point, I decided to proceed with a laparotomy.  I removed all trocars.  I then made an upper midline incision with a scalpel.  I took this down through the fascia with electrocautery.  The peritoneum was opened in the entire length of the incision.  Upon entering the abdomen, the patient had a very large transverse colon full of a lot of hard stool.  I palpated the porta and it was quite inflamed, but no discrete mass or lymph node could be identified or separated from the surrounding structures safely.  I explored the lesser curvature of the stomach and the gastrohepatic ligament, and again could not identify enlarged lymph node as well.  At this point, I had 1 of my partners, Dr. Sharrell Ku to proceed into the operating room, and he evaluated the patient as well and again agreed with me and could not find a specific lymph node.  I did find 1 fairly normal-appearing lymph node across the greater curvature of the stomach, which I excised with the cautery and sent to pathology for evaluation. I then thoroughly irrigated the abdomen with normal saline.  I then use a Tru-Cut needle biopsy to biopsy 2 areas in the left lobe of the liver. I then achieved hemostasis in these areas from the biopsy with the cautery.  I placed a piece of Surgicel on the surface of the liver as well as in the gallbladder fossa and at the porta.  Again, I could not specifically feel a mass in the pancreas or a separate lymph nodes that I could safely either needle biopsy or excise in the porta without risking significant  injury to the patient.  At this point, the abdomen was copiously irrigated with normal saline.  I evaluated the areas with the biopsy of the liver and these appeared to be hemostatic.  I did not appear to have any injury to the stomach or duodenum.  At this point, the abdomen again was irrigated with saline.  The midline fascia was closed with running #1 looped PDS suture.  I closed the small incision at the umbilicus with a 0 Vicryl pursestring suture that had been previously placed.  All incisions were anesthetized with Marcaine and closed with skin staples.  Gauze and tape were then applied.  The patient tolerated the procedure well.  All the counts were correct at the end of procedure.  The patient was then extubated in the operating and taken in stable condition to recovery room.  Coralie Keens, M.D.    DB/MEDQ  D:  03/20/2014  T:  03/21/2014  Job:  [id number]

## 2014-03-21 NOTE — Progress Notes (Signed)
1 Day Post-Op  Subjective: Complains for abdominal pain  Objective: Vital signs in last 24 hours: Temp:  [97.6 F (36.4 C)-99.3 F (37.4 C)] 98 F (36.7 C) (04/24 0820) Pulse Rate:  [95-110] 100 (04/24 0820) Resp:  [17-28] 20 (04/24 0820) BP: (132-171)/(78-99) 160/90 mmHg (04/24 0820) SpO2:  [36 %-100 %] 98 % (04/24 0820) Weight:  [291 lb 10.7 oz (132.3 kg)] 291 lb 10.7 oz (132.3 kg) (04/23 2135) Last BM Date: 03/19/14  Intake/Output from previous day: 04/23 0701 - 04/24 0700 In: 779 [I.V.:500; Blood:279] Out: 350 [Urine:100; Blood:250] Intake/Output this shift:   Abdomen soft, dressings dry  Lab Results:   Recent Labs  03/20/14 0713 03/21/14 0440  WBC 3.6* 10.9*  HGB 8.9* 9.6*  HCT 26.2* 28.1*  PLT 69* 118*   BMET  Recent Labs  03/20/14 0713 03/21/14 0440  NA 139 141  K 4.4 4.6  CL 104 104  CO2 16* 17*  GLUCOSE 100* 142*  BUN 74* 73*  CREATININE 7.29* 7.44*  CALCIUM 10.5 9.8   PT/INR No results found for this basename: LABPROT, INR,  in the last 72 hours ABG No results found for this basename: PHART, PCO2, PO2, HCO3,  in the last 72 hours  Studies/Results: No results found.  Anti-infectives: Anti-infectives   Start     Dose/Rate Route Frequency Ordered Stop   03/19/14 2200  levofloxacin (LEVAQUIN) IVPB 500 mg     500 mg 100 mL/hr over 60 Minutes Intravenous Every 48 hours 03/17/14 1931     03/17/14 2115  levofloxacin (LEVAQUIN) IVPB 750 mg     750 mg 100 mL/hr over 90 Minutes Intravenous  Once 03/17/14 2112 03/18/14 0135   03/17/14 1945  metroNIDAZOLE (FLAGYL) IVPB 500 mg     500 mg 100 mL/hr over 60 Minutes Intravenous Every 8 hours 03/17/14 1931        Assessment/Plan: s/p Procedure(s): LAPAROSCOPIC CHOLECYSTECTOMY (N/A) INTRA-ABDOMINAL LYMPH NODE BIOPSY (N/A) TRU CUT LIVER BIOPSY (N/A)  Stable post op Will let him have sips  At surgery, had rock hard, enlarged liver and acute gangrenous cholecystitis.  I could not separate out any  of the enlarged nodes in the porta hepatitis or around the pancreas to remove or even safely biopsy.  LOS: 4 days    Harl Bowie 03/21/2014

## 2014-03-21 NOTE — Progress Notes (Signed)
Bladder Scan done.  >999 urine, Paged Dr. Georgette Dover.

## 2014-03-21 NOTE — Progress Notes (Signed)
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 Denied nausea, fever or chills.  In the ED, CT scan of the abdomen showed no active complications from specific biopsy site but did show extensive upper abdominal and retroperitoneal adenopathy concerning for lymphoma. There is also mild distended gallbladder gallbladder thickening.? Lingular pneumonia versus atelectasis. Right adrenal adenoma. Creatinine at 7.6. Baseline seems to be between 5 and 7. Calcium 12.2. Alkaline phosphatase 143. Lipase within normal limits at 52. Platelet count is 56. Hemodynamically stable.   Assessment/Plan: Acute cholecystitis  Treated supportively with bowel rest, IV fluids, IV antibiotics (Levaquin and Flagyl) and pain management. GI, surgery and hematology consulted.  laparoscopic cholecystectomy ( showed gangrenous cholecystitis) with lymph node biopsy and liver biopsy on 4/23. Received platelet transfusion. Still has pain post op and is on dilaudid PCA.  Granulomatous hepatitis/cirrhosis with portal hypertension:  Diagnosed by recent liver biopsy . GI-consultation appreciated. Patient has lymphadenopathy on CT abdomen but according to IR cannot be accessed by percutaneous route. repeat liver biopsy during laparoscopy and retroperitoneal LN bx on 4/23. Pending results.  CKD 5/metabolic acidosis:  Not started dialysis yet. Apparently transplant candidate. Nephrology consultation and followup appreciated. Started on bicarb drip. Bicarb now  improving . Has AG of 20 this am. Urine output seems appropriate. Patient oliguric post op and found to have 1L residue on bladder scan. Foley placed in today and voided 1.4L . Monitor I/O  Anemia:  Multifactorial secondary to ESRD, splenomegaly and chronic disease. Stable. Transfuse if hemoglobin less than 7 g per DL.  Thrombocytopenia:  Patient seems to have chronic thrombocytopenia but has dropped from 122 on 4/15 to 56 on 4/20. Platelet counts better..Secondary to splenomegaly and sequestration. Hematology consultation appreciated-recommend transfusing platelets. Improved to 118 this am.  Intra-abdominal/retroperitoneal lymphadenopathy:  Unclear etiology. Differential includes  lymphoma, metastatic carcinoma vs sarcoid. Follow bx   Hypercalcemia:  sarcoid (seems likely) vs malignancy. Continue IV fluids. Await lymph node biopsy. ACE level 98. Intact PTH 9.2. Continue Calcitonin nasal spray.  Hypertension:  elevated BP likely due to pain. Increase amlodipine to 10 mg daily   Code Status: Full  Family Communication: None at bedside  Disposition Plan: Remains inpatient    Consultants:  Gastroenterology  General surgery  Nephrology  Hematology   Procedures:  Lap cholecystectomy with intraabdominal LN  bx , liver bx on 4/23  Antibiotics:  IV levofloxacin  IV Flagyl    HPI/Subjective: Patient seen and examined this am. Complains of pain over RUQ area. Requiring PCA . Denies N/V. Has not had BM for past 4-5 days. Oliguric post op with bladder scan showing 1L residue this am. Foley placed and voided 1400 cc urine.  Objective: Filed Vitals:   03/21/14 0820  BP: 160/90  Pulse: 100  Temp: 98 F (36.7 C)  Resp: 20    Intake/Output Summary (Last 24 hours) at 03/21/14 1505 Last data filed at 03/21/14 1417  Gross per 24 hour  Intake      0 ml  Output   1650 ml  Net  -1650 ml   Filed Weights   03/19/14 0406 03/19/14 2139 03/20/14 2135  Weight: 131.362 kg (289 lb 9.6  oz) 131.57 kg (290 lb 1 oz) 132.3 kg (291 lb 10.7 oz)    Exam:   General:  Middle aged male i NAD  HEENT: no pallor, moist mucosa  Chest: clear b/l, no added sounds  CVS: NS1&S2, no murmurs, rubs or gallop  abd: soft, dressing over laparoscopy site intact, BS+, RUQ tenderness  Ext: warm, no edema  C NS: AAOX3  Data Reviewed: Basic Metabolic Panel:  Recent Labs Lab 03/17/14 1430 03/17/14 1932 03/18/14 1105 03/19/14 0711 03/20/14 0713 03/21/14 0440  NA 138  --  137 140 139 141  K 4.6  --  4.5 4.8 4.4 4.6  CL 99  --  99 103 104 104  CO2 19  --  16* 17* 16* 17*  GLUCOSE 116*  --  103* 97 100* 142*  BUN 72*  --  72* 76* 74* 73*  CREATININE 7.86*  --  7.52* 7.85* 7.29* 7.44*  CALCIUM 12.2* 11.1* 11.2* 11.0* 10.5 9.8  MG  --   --   --  2.5  --   --   PHOS  --   --   --  6.7*  --  8.1*   Liver Function Tests:  Recent Labs Lab 03/17/14 1430 03/18/14 1105 03/19/14 0711 03/20/14 0713 03/21/14 0440  AST 21 41* 29 22 40*  ALT 29 37 31 25 31   ALKPHOS 143* 229* 195* 164* 133*  BILITOT 0.5 0.9 0.5 0.4 0.5  PROT 9.1* 8.2 8.1 7.9 7.5  ALBUMIN 4.2 3.7 3.7 3.7 3.4*    Recent Labs Lab 03/17/14 1430  LIPASE 52   No results found for this basename: AMMONIA,  in the last 168 hours CBC:  Recent Labs Lab 03/17/14 1517 03/18/14 1105 03/19/14 0711 03/20/14 0713 03/21/14 0440  WBC 4.5 4.8 3.7* 3.6* 10.9*  NEUTROABS 3.3 3.3 2.6 2.7 9.5*  HGB 10.5* 9.5* 9.0* 8.9* 9.6*  HCT 30.0* 27.4* 26.5* 26.2* 28.1*  MCV 92.3 93.2 94.3 94.2 94.9  PLT 56* 55* 53* 69* 118*   Cardiac Enzymes: No results found for this basename: CKTOTAL, CKMB, CKMBINDEX, TROPONINI,  in the last 168 hours BNP (last 3 results) No results found for this basename: PROBNP,  in the last 8760 hours CBG: No results found for this basename: GLUCAP,  in the last 168 hours  Recent Results (from the past 240 hour(s))  SURGICAL PCR SCREEN     Status: Abnormal   Collection Time    03/20/14  9:35 AM       Result Value Ref Range Status   MRSA, PCR NEGATIVE  NEGATIVE Final   Staphylococcus aureus POSITIVE (*) NEGATIVE Final   Comment:            The Xpert SA Assay (FDA     approved for NASAL specimens     in patients over 19 years of age),     is one component of     a comprehensive surveillance     program.  Test performance has     been validated by [employer] for patients greater     than or equal to 68 year old.     It is not intended     to diagnose infection nor to     guide or monitor treatment.     Studies: No results found.  Scheduled Meds: . amLODipine  5 mg Oral Daily  . antiseptic oral rinse  15 mL Mouth  Rinse BID  . calcitonin (salmon)  1 spray Alternating Nares Daily  . cyanocobalamin  250 mcg Oral Daily  . levofloxacin (LEVAQUIN) IV  500 mg Intravenous Q48H  . metronidazole  500 mg Intravenous Q8H  . morphine   Intravenous 6 times per day  . pantoprazole (PROTONIX) IV  40 mg Intravenous Q12H  . sevelamer carbonate  800 mg Oral TID WC  . sodium chloride  3 mL Intravenous Q12H   Continuous Infusions: .  sodium bicarbonate  infusion 1000 mL 75 mL/hr at 03/21/14 1218      Time spent: 35 minutes    Steven Best  [facility] Pager [phone]. If 7PM-7AM, please contact night-coverage at [email or web address], password [facility] 03/21/2014, 3:05 PM  LOS: 4 days

## 2014-03-21 NOTE — Progress Notes (Signed)
Amion msg did not go through, called office and they gave paged number [phone], paged Dr. Servando Salina, t/o place foley catheter and leave it in.

## 2014-03-21 NOTE — Progress Notes (Signed)
Pt back to bed.  Family at bedside. Pt tolerated back to bed well.

## 2014-03-21 NOTE — Progress Notes (Signed)
Pt set up to bedside for about 1 hour. Pt is waiting for son to get here to get up to chair.

## 2014-03-22 ENCOUNTER — Inpatient Hospital Stay (HOSPITAL_COMMUNITY): Payer: BC Managed Care – PPO

## 2014-03-22 DIAGNOSIS — N186 End stage renal disease: Secondary | ICD-10-CM

## 2014-03-22 DIAGNOSIS — E872 Acidosis, unspecified: Secondary | ICD-10-CM

## 2014-03-22 LAB — CBC WITH DIFFERENTIAL/PLATELET
Basophils Absolute: 0 10*3/uL (ref 0.0–0.1)
Basophils Relative: 0 % (ref 0–1)
Eosinophils Absolute: 0.1 10*3/uL (ref 0.0–0.7)
Eosinophils Relative: 1 % (ref 0–5)
HCT: 27.1 % — ABNORMAL LOW (ref 39.0–52.0)
Hemoglobin: 9.1 g/dL — ABNORMAL LOW (ref 13.0–17.0)
LYMPHS PCT: 5 % — AB (ref 12–46)
Lymphs Abs: 0.7 10*3/uL (ref 0.7–4.0)
MCH: 32.3 pg (ref 26.0–34.0)
MCHC: 33.6 g/dL (ref 30.0–36.0)
MCV: 96.1 fL (ref 78.0–100.0)
MONOS PCT: 7 % (ref 3–12)
Monocytes Absolute: 1 10*3/uL (ref 0.1–1.0)
NEUTROS PCT: 87 % — AB (ref 43–77)
Neutro Abs: 12.3 10*3/uL — ABNORMAL HIGH (ref 1.7–7.7)
PLATELETS: 160 10*3/uL (ref 150–400)
RBC: 2.82 MIL/uL — AB (ref 4.22–5.81)
RDW: 13.7 % (ref 11.5–15.5)
WBC: 14.1 10*3/uL — AB (ref 4.0–10.5)

## 2014-03-22 LAB — COMPREHENSIVE METABOLIC PANEL
ALT: 25 U/L (ref 0–53)
AST: 30 U/L (ref 0–37)
Albumin: 3.3 g/dL — ABNORMAL LOW (ref 3.5–5.2)
Alkaline Phosphatase: 109 U/L (ref 39–117)
BILIRUBIN TOTAL: 0.6 mg/dL (ref 0.3–1.2)
BUN: 79 mg/dL — AB (ref 6–23)
CO2: 19 meq/L (ref 19–32)
CREATININE: 8.27 mg/dL — AB (ref 0.50–1.35)
Calcium: 9.9 mg/dL (ref 8.4–10.5)
Chloride: 99 mEq/L (ref 96–112)
GFR calc Af Amer: 8 mL/min — ABNORMAL LOW (ref >90)
GFR, EST NON AFRICAN AMERICAN: 7 mL/min — AB (ref >90)
Glucose, Bld: 113 mg/dL — ABNORMAL HIGH (ref 70–99)
Potassium: 4.1 mEq/L (ref 3.7–5.3)
Sodium: 141 mEq/L (ref 137–147)
Total Protein: 7.5 g/dL (ref 6.0–8.3)

## 2014-03-22 MED ORDER — OXYCODONE-ACETAMINOPHEN 5-325 MG PO TABS
1.0000 | ORAL_TABLET | ORAL | Status: DC | PRN
  Administered 2014-03-22 – 2014-03-23 (×2): 2 via ORAL
  Filled 2014-03-22 (×2): qty 2

## 2014-03-22 MED ORDER — PANTOPRAZOLE SODIUM 40 MG PO TBEC
40.0000 mg | DELAYED_RELEASE_TABLET | Freq: Two times a day (BID) | ORAL | Status: DC
  Administered 2014-03-22 – 2014-03-25 (×7): 40 mg via ORAL
  Filled 2014-03-22 (×5): qty 1

## 2014-03-22 MED ORDER — SEVELAMER CARBONATE 800 MG PO TABS
1600.0000 mg | ORAL_TABLET | Freq: Three times a day (TID) | ORAL | Status: DC
  Administered 2014-03-22 – 2014-03-25 (×10): 1600 mg via ORAL
  Filled 2014-03-22 (×11): qty 2

## 2014-03-22 NOTE — Progress Notes (Signed)
I have seen and examined the patient and agree with the assessment and plans.  Yalda Herd A. Vylet Maffia  MD, FACS  

## 2014-03-22 NOTE — Progress Notes (Signed)
Patient ID: Steven Best, male   DOB: 07/03/1966, 48 y.o.   MRN: [id number] S:feels better after the foley catheter O:BP 128/77  Pulse 108  Temp(Src) 98.6 F (37 C) (Oral)  Resp 18  Ht 6\' 4"  (1.93 m)  Wt 133.4 kg (294 lb 1.5 oz)  BMI 35.81 kg/m2  SpO2 99%  Intake/Output Summary (Last 24 hours) at 03/22/14 1209 Last data filed at 03/22/14 0600  Gross per 24 hour  Intake   2925 ml  Output   1400 ml  Net   1525 ml   Intake/Output: I/O last 3 completed shifts: In: 2925 [P.O.:600; I.V.:825; IV Piggyback:1500] Out: [id number] [Urine:1650]  Intake/Output this shift:    Weight change: 1.1 kg (2 lb 6.8 oz) Gen:WD obese WM in NAd CVS:no rub Resp:cta [id number] BS, soft, mildly tender but no guarding/rebound Ext:no edema, L AVF +T/B Neuro: no asterixis   Recent Labs Lab 03/17/14 1430 03/17/14 1932 03/18/14 1105 03/19/14 0711 03/20/14 0713 03/21/14 0440 03/22/14 1030  NA 138  --  137 140 139 141 141  K 4.6  --  4.5 4.8 4.4 4.6 4.1  CL 99  --  99 103 104 104 99  CO2 19  --  16* 17* 16* 17* 19  GLUCOSE 116*  --  103* 97 100* 142* 113*  BUN 72*  --  72* 76* 74* 73* 79*  CREATININE 7.86*  --  7.52* 7.85* 7.29* 7.44* 8.27*  ALBUMIN 4.2  --  3.7 3.7 3.7 3.4* 3.3*  CALCIUM 12.2* 11.1* 11.2* 11.0* 10.5 9.8 9.9  PHOS  --   --   --  6.7*  --  8.1*  --   AST 21  --  41* 29 22 40* 30  ALT 29  --  37 31 25 31 25    Liver Function Tests:  Recent Labs Lab 03/20/14 0713 03/21/14 0440 03/22/14 1030  AST 22 40* 30  ALT 25 31 25   ALKPHOS 164* 133* 109  BILITOT 0.4 0.5 0.6  PROT 7.9 7.5 7.5  ALBUMIN 3.7 3.4* 3.3*    Recent Labs Lab 03/17/14 1430  LIPASE 52   No results found for this basename: AMMONIA,  in the last 168 hours CBC:  Recent Labs Lab 03/18/14 1105 03/19/14 0711 03/20/14 0713 03/21/14 0440 03/22/14 1030  WBC 4.8 3.7* 3.6* 10.9* 14.1*  NEUTROABS 3.3 2.6 2.7 9.5* 12.3*  HGB 9.5* 9.0* 8.9* 9.6* 9.1*  HCT 27.4* 26.5* 26.2* 28.1* 27.1*  MCV 93.2 94.3  94.2 94.9 96.1  PLT 55* 53* 69* 118* 160   Cardiac Enzymes: No results found for this basename: CKTOTAL, CKMB, CKMBINDEX, TROPONINI,  in the last 168 hours CBG: No results found for this basename: GLUCAP,  in the last 168 hours  Iron Studies: No results found for this basename: IRON, TIBC, TRANSFERRIN, FERRITIN,  in the last 72 hours Studies/Results: No results found. Marland Kitchen amLODipine  10 mg Oral Daily  . antiseptic oral rinse  15 mL Mouth Rinse BID  . calcitonin (salmon)  1 spray Alternating Nares Daily  . cyanocobalamin  250 mcg Oral Daily  . levofloxacin (LEVAQUIN) IV  500 mg Intravenous Q48H  . metronidazole  500 mg Intravenous Q8H  . pantoprazole  40 mg Oral BID  . sevelamer carbonate  800 mg Oral TID WC  . sodium chloride  3 mL Intravenous Q12H    BMET    Component Value Date/Time   NA 141 03/22/2014 1030   NA 137 05/01/2013 1033  K 4.1 03/22/2014 1030   K 4.5 05/01/2013 1033   CL 99 03/22/2014 1030   CL 105 05/01/2013 1033   CO2 19 03/22/2014 1030   CO2 17* 05/01/2013 1033   GLUCOSE 113* 03/22/2014 1030   GLUCOSE 89 05/01/2013 1033   BUN 79* 03/22/2014 1030   BUN 85.7* 05/01/2013 1033   CREATININE 8.27* 03/22/2014 1030   CREATININE 6.6 Repeated and Verified* 05/01/2013 1033   CALCIUM 9.9 03/22/2014 1030   CALCIUM 11.1* 03/17/2014 1932   CALCIUM 9.9 05/01/2013 1033   GFRNONAA 7* 03/22/2014 1030   GFRAA 8* 03/22/2014 1030   CBC    Component Value Date/Time   WBC 14.1* 03/22/2014 1030   WBC 3.2* 05/01/2013 1033   RBC 2.82* 03/22/2014 1030   RBC 3.26* 05/01/2013 1033   HGB 9.1* 03/22/2014 1030   HGB 11.0* 05/01/2013 1033   HCT 27.1* 03/22/2014 1030   HCT 32.0* 05/01/2013 1033   PLT 160 03/22/2014 1030   PLT 110* 05/01/2013 1033   MCV 96.1 03/22/2014 1030   MCV 98.1* 05/01/2013 1033   MCH 32.3 03/22/2014 1030   MCH 33.8* 05/01/2013 1033   MCHC 33.6 03/22/2014 1030   MCHC 34.4 05/01/2013 1033   RDW 13.7 03/22/2014 1030   RDW 13.1 05/01/2013 1033   LYMPHSABS 0.7 03/22/2014 1030   LYMPHSABS 0.4* 05/01/2013  1033   MONOABS 1.0 03/22/2014 1030   MONOABS 0.5 05/01/2013 1033   EOSABS 0.1 03/22/2014 1030   EOSABS 0.1 05/01/2013 1033   BASOSABS 0.0 03/22/2014 1030   BASOSABS 0.0 05/01/2013 1033     Assessment/Plan:  1. AKI/CKD- in setting of decreased po intake and likely ischemic ATN, also with hypercalcemia. No evidence of uremia but BUN and Scr cont to increase 1. Marked increase in UOP following foley 2. Will cont to follow closely and if his Scr cont to increase will initiate HD tomorrow.   3. Will plan for HD tomorrow if no sig improvement of Scr 2. Urinary retention vs. BOO- had over 1300cc residual in bladder yesterday, s/p foley with marked improvement in UOP.  May be urinary retention following anesthesia vs neurogenic bladder vs. BPH.  ?need for flomax or Urology eval.  Contiue with foley for now, will follow and remove foley tomorrow and follow residuals if ok with primary 3. Metabolic acidosis - probably driving his tachypnea and hyperpnea in absence of any evidence for volume overload.  1. Changed IVF to isotonic sodium bicarbonate at 75/hour; with improvement of CO2 4. Acute cholecystits- s/p lap chole (plus liver bx, node resection) Path pending 5. Hypercalcemia- ?sarcoidosis vs. Malignancy. Surgical path pending. ACE level elevated (and PTH suppressed) putting sarcoidosis higher on the DDX for his hypercalcemia.  1. Improved with medical management 2. Continue calcitonin nasal spray.  3. If sarcoid would require steroids but not empiric due to recent surgery. 6. SHPTH- increase renvela due to increase in Phos 7. Thrombocytopenia- per Heme/Onc, possibly related to splenomegaly. improving 8. Cirrhosis- c/b thrombocytopenia, splenomegaly; repeat bx done today - path pending 9. Diffuse intra-abdominal retroperitoneal lymphadenopathy- a perigastric node was removed 10. Vascular access- LAVF mature and ready for use if needed 11.   [place]

## 2014-03-22 NOTE — Progress Notes (Signed)
03/22/14 1525 nsg Pt ambulated in hallway after giving pain med . Pt . tolerated no complaints .

## 2014-03-22 NOTE — Progress Notes (Signed)
Patient ID: Steven Best, male   DOB: 13-Feb-1966, 48 y.o.   MRN: [id number]   Subjective: Passing flatus.  Has not been OOB.  Pain is better. Not using much PCA, but dilaudid q2h.  No n/v.  Tolerated clears.    Objective:  Vital signs:  Filed Vitals:   03/21/14 2046 03/21/14 2130 03/21/14 2326 03/22/14 0614  BP:  127/86  128/77  Pulse:  106  108  Temp:  98.9 F (37.2 C)  98.6 F (37 C)  TempSrc:  Oral  Oral  Resp: _0 Height:      Weight:  294 lb 1.5 oz (133.4 kg)    SpO2: 98% 97% 99% 99%    Last BM Date: 03/19/14  Intake/Output   Yesterday:  04/24 0701 - 04/25 0700 In: 2925 [P.O.:600; I.V.:825; IV Piggyback:1500] Out: 2025 [Urine:1550] This shift:    I/O last 3 completed shifts: In: 2925 [P.O.:600; I.V.:825; IV Piggyback:1500] Out: 4270 [Urine:1650]    Physical Exam: General: Pt awake/alert/oriented x4 in no acute distress Abdomen: Soft.  Nondistended.  +bs. Lap site to midline, dressing removed, staples are intact, RUQ incision, dressing removed staples are intact.   No evidence of peritonitis.  No incarcerated hernias.   Problem List:   Active Problems:   Lymphoma   Cholecystitis, acute   Chronic kidney disease, stage V   Anemia   Thrombocytopenia, unspecified   Abdominal lymphadenopathy    Results:   Labs: Results for orders placed during the hospital encounter of 03/17/14 (from the past 48 hour(s))  TYPE AND SCREEN     Status: None   Collection Time    03/20/14 11:15 AM      Result Value Ref Range   ABO/RH(D) O NEG     Antibody Screen NEG     Sample Expiration 03/23/2014    COMPREHENSIVE METABOLIC PANEL     Status: Abnormal   Collection Time    03/21/14  4:40 AM      Result Value Ref Range   Sodium 141  137 - 147 mEq/L   Potassium 4.6  3.7 - 5.3 mEq/L   Chloride 104  96 - 112 mEq/L   CO2 17 (*) 19 - 32 mEq/L   Glucose, Bld 142 (*) 70 - 99 mg/dL   BUN 73 (*) 6 - 23 mg/dL   Creatinine, Ser 7.44 (*) 0.50 - 1.35 mg/dL   Calcium 9.8  8.4 - 10.5 mg/dL   Total Protein 7.5  6.0 - 8.3 g/dL   Albumin 3.4 (*) 3.5 - 5.2 g/dL   AST 40 (*) 0 - 37 U/L   ALT 31  0 - 53 U/L   Alkaline Phosphatase 133 (*) 39 - 117 U/L   Total Bilirubin 0.5  0.3 - 1.2 mg/dL   GFR calc non Af Amer 8 (*) >90 mL/min   GFR calc Af Amer 9 (*) >90 mL/min   Comment: (NOTE)     The eGFR has been calculated using the CKD EPI equation.     This calculation has not been validated in all clinical situations.     eGFR's persistently <90 mL/min signify possible Chronic Kidney     Disease.  CBC WITH DIFFERENTIAL     Status: Abnormal   Collection Time    03/21/14  4:40 AM      Result Value Ref Range   WBC 10.9 (*) 4.0 - 10.5 K/uL   RBC 2.96 (*) 4.22 - 5.81 MIL/uL   Hemoglobin  9.6 (*) 13.0 - 17.0 g/dL   HCT 28.1 (*) 39.0 - 52.0 %   MCV 94.9  78.0 - 100.0 fL   MCH 32.4  26.0 - 34.0 pg   MCHC 34.2  30.0 - 36.0 g/dL   RDW 13.7  11.5 - 15.5 %   Platelets 118 (*) 150 - 400 K/uL   Comment: DELTA CHECK NOTED     POST TRANSFUSION SPECIMEN   Neutrophils Relative % 87 (*) 43 - 77 %   Neutro Abs 9.5 (*) 1.7 - 7.7 K/uL   Lymphocytes Relative 3 (*) 12 - 46 %   Lymphs Abs 0.3 (*) 0.7 - 4.0 K/uL   Monocytes Relative 10  3 - 12 %   Monocytes Absolute 1.1 (*) 0.1 - 1.0 K/uL   Eosinophils Relative 0  0 - 5 %   Eosinophils Absolute 0.0  0.0 - 0.7 K/uL   Basophils Relative 0  0 - 1 %   Basophils Absolute 0.0  0.0 - 0.1 K/uL  PHOSPHORUS     Status: Abnormal   Collection Time    03/21/14  4:40 AM      Result Value Ref Range   Phosphorus 8.1 (*) 2.3 - 4.6 mg/dL    Imaging / Studies: No results found.  Medications / Allergies: per chart  Antibiotics: Anti-infectives   Start     Dose/Rate Route Frequency Ordered Stop   03/19/14 2200  levofloxacin (LEVAQUIN) IVPB 500 mg     500 mg 100 mL/hr over 60 Minutes Intravenous Every 48 hours 03/17/14 1931     03/17/14 2115  levofloxacin (LEVAQUIN) IVPB 750 mg     750 mg 100 mL/hr over 90 Minutes  Intravenous  Once 03/17/14 2112 03/18/14 0135   03/17/14 1945  metroNIDAZOLE (FLAGYL) IVPB 500 mg     500 mg 100 mL/hr over 60 Minutes Intravenous Every 8 hours 03/17/14 1931        Assessment/Plan RUQ abdominal pain possible acute cholecystitis  granulomatous hepatitis/cirrhosis  Acute on CKD stage V  Upper abdominal and retroperitoneal lymphadenopathy  Splenomegaly  Hypercalcemia  Thrombocytopenia  Anemia   S/p laparoscopic cholecystectomy converted to open intra-abdominal lymph node biopsy and liver biopsy---Dr. Ninfa Linden POD#2 -advance to a full liquid diet -ambulate, up to chair with meals, IS -DC PCA and start oral pain meds -WBC up today, afebrile, monitor -await pathology results  Erby Pian, [facility] Surgery Pager [phone] Office [phone]  03/22/2014 9:46 AM

## 2014-03-22 NOTE — Progress Notes (Signed)
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 Denied nausea, fever or chills.  In the ED, CT scan of the abdomen showed no active complications from specific biopsy site but did show extensive upper abdominal and retroperitoneal adenopathy concerning for lymphoma. There is also mild distended gallbladder with  gallbladder thickening.? Lingular pneumonia versus atelectasis. Right adrenal adenoma. Creatinine at 7.6. Baseline seems to be between 5 and 7. Calcium 12.2. Alkaline phosphatase 143. Lipase within normal limits at 52. Platelet count is 56. Hemodynamically stable.   Assessment/Plan: Acute cholecystitis  Treated supportively with bowel rest, IV fluids, IV antibiotics (Levaquin and Flagyl) and pain management. GI, surgery and hematology consulted.  laparoscopic cholecystectomy ( showed gangrenous cholecystitis) with lymph node biopsy and liver biopsy on 4/23. Received platelet transfusion. Off PCA. Stable on prn percocet  Granulomatous hepatitis/cirrhosis with portal hypertension:  Diagnosed by recent liver biopsy . GI-consultation appreciated. Patient has lymphadenopathy on CT abdomen but according to IR cannot be accessed by percutaneous route. repeat liver biopsy during laparoscopy and retroperitoneal LN bx on 4/23. Pending results.  CKD 5/metabolic acidosis:  Not started dialysis yet. Apparently transplant candidate. Nephrology consultation and followup appreciated. Started on bicarb drip. Bicarb now  improving but still has AG. Creatinine worsening. Renal plans on HD if creatinine not improved in am. . Urine output seems appropriate after foley placed in..  Bladder outlet obstruction  day 2 post op. Had good UOP after foley placed in. Will obtain renal US.   Anemia:  Multifactorial secondary to ESRD, splenomegaly and chronic disease. Stable. Transfuse if hemoglobin less than 7 g per DL.  Thrombocytopenia:  Patient seems to have chronic thrombocytopenia but has dropped from 122 on 4/15 to 56 on 4/20. Platelet counts better..Secondary to splenomegaly and sequestration. Hematology consultation appreciated-recommend transfusing platelets. Improved to 160 this am.  Intra-abdominal/retroperitoneal lymphadenopathy:  Unclear etiology. Differential includes  lymphoma, metastatic carcinoma vs sarcoid. Follow bx   Hypercalcemia:  sarcoid (seems likely) vs malignancy. Improved with fluids.  Await lymph node biopsy. ACE level 98. Intact PTH 9.2. Continue Calcitonin nasal spray.  Hypertension:  elevated BP likely due to pain. Increased amlodipine to 10 mg daily  Tachycardia  likely due to metabolic acidosis   Code Status: Full  Family Communication: None at bedside  Disposition Plan: Remains inpatient    Consultants:  Gastroenterology  General surgery  Nephrology  Hematology   Procedures:  Lap cholecystectomy with intraabdominal LN  bx , liver bx on 4/23  Antibiotics:  IV levofloxacin  IV Flagyl    HPI/Subjective: Patient seen and examined this am. RUQ Pain better. Tachycardic  Objective: Filed Vitals:   03/22/14 1332  BP: 142/83  Pulse: 119  Temp: 98.2 F (36.8 C)  Resp: 20    Intake/Output Summary (Last 24 hours) at 03/22/14 1645 Last data filed at 03/22/14 1100  Gross per 24 hour  Intake   2925 ml  Output   1000 ml  Net   1925 ml   Filed Weights   03/19/14 2139 03/20/14 2135 03/21/14 2130  Weight: 131.57 kg (290 lb 1 oz) 132.3 kg (291 lb 10.7 oz) 133.4 kg  (  294 lb 1.5 oz)    Exam:   General:  Middle aged male i NAD  HEENT: no pallor, moist mucosa  Chest: clear b/l, no added sounds  CVS:S1&S2 tachycardic , no murmurs, rubs or gallop  abd: soft, dressing over laparoscopy site intact, BS+, RUQ tenderness  Ext: warm, no edema  C NS: AAOX3  Data Reviewed: Basic Metabolic Panel:  Recent Labs Lab 03/18/14 1105 03/19/14 0711 03/20/14 0713 03/21/14 0440 03/22/14 1030  NA 137 140 139 141 141  K 4.5 4.8 4.4 4.6 4.1  CL 99 103 104 104 99  CO2 16* 17* 16* 17* 19  GLUCOSE 103* 97 100* 142* 113*  BUN 72* 76* 74* 73* 79*  CREATININE 7.52* 7.85* 7.29* 7.44* 8.27*  CALCIUM 11.2* 11.0* 10.5 9.8 9.9  MG  --  2.5  --   --   --   PHOS  --  6.7*  --  8.1*  --    Liver Function Tests:  Recent Labs Lab 03/18/14 1105 03/19/14 0711 03/20/14 0713 03/21/14 0440 03/22/14 1030  AST 41* 29 22 40* 30  ALT 37 31 25 31 25   ALKPHOS 229* 195* 164* 133* 109  BILITOT 0.9 0.5 0.4 0.5 0.6  PROT 8.2 8.1 7.9 7.5 7.5  ALBUMIN 3.7 3.7 3.7 3.4* 3.3*    Recent Labs Lab 03/17/14 1430  LIPASE 52   No results found for this basename: AMMONIA,  in the last 168 hours CBC:  Recent Labs Lab 03/18/14 1105 03/19/14 0711 03/20/14 0713 03/21/14 0440 03/22/14 1030  WBC 4.8 3.7* 3.6* 10.9* 14.1*  NEUTROABS 3.3 2.6 2.7 9.5* 12.3*  HGB 9.5* 9.0* 8.9* 9.6* 9.1*  HCT 27.4* 26.5* 26.2* 28.1* 27.1*  MCV 93.2 94.3 94.2 94.9 96.1  PLT 55* 53* 69* 118* 160   Cardiac Enzymes: No results found for this basename: CKTOTAL, CKMB, CKMBINDEX, TROPONINI,  in the last 168 hours BNP (last 3 results) No results found for this basename: PROBNP,  in the last 8760 hours CBG: No results found for this basename: GLUCAP,  in the last 168 hours  Recent Results (from the past 240 hour(s))  SURGICAL PCR SCREEN     Status: Abnormal   Collection Time    03/20/14  9:35 AM      Result Value Ref Range Status   MRSA, PCR NEGATIVE  NEGATIVE Final   Staphylococcus aureus  POSITIVE (*) NEGATIVE Final   Comment:            The Xpert SA Assay (FDA     approved for NASAL specimens     in patients over 47 years of age),     is one component of     a comprehensive surveillance     program.  Test performance has     been validated by [employer] for patients greater     than or equal to 83 year old.     It is not intended     to diagnose infection nor to     guide or monitor treatment.     Studies: No results found.  Scheduled Meds: . amLODipine  10 mg Oral Daily  . antiseptic oral rinse  15 mL Mouth Rinse BID  . calcitonin (salmon)  1 spray Alternating Nares Daily  . cyanocobalamin  250 mcg Oral Daily  . levofloxacin (LEVAQUIN) IV  500 mg Intravenous Q48H  . metronidazole  500 mg Intravenous Q8H  . pantoprazole  40 mg Oral  BID  . sevelamer carbonate  1,600 mg Oral TID WC  . sodium chloride  3 mL Intravenous Q12H   Continuous Infusions: .  sodium bicarbonate  infusion 1000 mL 75 mL/hr at 03/21/14 1218      Time spent: 35 minutes    Lutricia Widjaja  [facility] Pager [phone]. If 7PM-7AM, please contact night-coverage at [email or web address], password [facility] 03/22/2014, 4:45 PM  LOS: 5 days

## 2014-03-22 NOTE — Plan of Care (Signed)
Problem: Phase II Progression Outcomes Goal: Progress activity as tolerated unless otherwise ordered Outcome: Progressing Pt up on chair; plan to ambulate later in hallway

## 2014-03-22 NOTE — Progress Notes (Signed)
Case discussed with my partner Dr. Oletta Lamas and we are waiting on the biopsies and please call me this weekend if I can be of any assistance otherwise continue postop care per surgical team

## 2014-03-23 LAB — VITAMIN D 1,25 DIHYDROXY
VITAMIN D 1, 25 (OH) TOTAL: 24 pg/mL (ref 18–72)
VITAMIN D3 1, 25 (OH): 24 pg/mL

## 2014-03-23 LAB — CBC WITH DIFFERENTIAL/PLATELET
BASOS ABS: 0 10*3/uL (ref 0.0–0.1)
Basophils Relative: 0 % (ref 0–1)
Eosinophils Absolute: 0.1 10*3/uL (ref 0.0–0.7)
Eosinophils Relative: 1 % (ref 0–5)
HEMATOCRIT: 23.9 % — AB (ref 39.0–52.0)
Hemoglobin: 8.2 g/dL — ABNORMAL LOW (ref 13.0–17.0)
Lymphocytes Relative: 7 % — ABNORMAL LOW (ref 12–46)
Lymphs Abs: 0.7 10*3/uL (ref 0.7–4.0)
MCH: 32.5 pg (ref 26.0–34.0)
MCHC: 34.3 g/dL (ref 30.0–36.0)
MCV: 94.8 fL (ref 78.0–100.0)
Monocytes Absolute: 1 10*3/uL (ref 0.1–1.0)
Monocytes Relative: 9 % (ref 3–12)
NEUTROS ABS: 8.8 10*3/uL — AB (ref 1.7–7.7)
Neutrophils Relative %: 83 % — ABNORMAL HIGH (ref 43–77)
PLATELETS: 156 10*3/uL (ref 150–400)
RBC: 2.52 MIL/uL — ABNORMAL LOW (ref 4.22–5.81)
RDW: 13.6 % (ref 11.5–15.5)
WBC: 10.7 10*3/uL — AB (ref 4.0–10.5)

## 2014-03-23 LAB — COMPREHENSIVE METABOLIC PANEL
ALK PHOS: 91 U/L (ref 39–117)
ALT: 19 U/L (ref 0–53)
AST: 20 U/L (ref 0–37)
Albumin: 3 g/dL — ABNORMAL LOW (ref 3.5–5.2)
BILIRUBIN TOTAL: 0.5 mg/dL (ref 0.3–1.2)
BUN: 82 mg/dL — AB (ref 6–23)
CHLORIDE: 96 meq/L (ref 96–112)
CO2: 23 meq/L (ref 19–32)
Calcium: 9.5 mg/dL (ref 8.4–10.5)
Creatinine, Ser: 8.7 mg/dL — ABNORMAL HIGH (ref 0.50–1.35)
GFR, EST AFRICAN AMERICAN: 7 mL/min — AB (ref >90)
GFR, EST NON AFRICAN AMERICAN: 6 mL/min — AB (ref >90)
Glucose, Bld: 108 mg/dL — ABNORMAL HIGH (ref 70–99)
Potassium: 3.5 mEq/L — ABNORMAL LOW (ref 3.7–5.3)
SODIUM: 141 meq/L (ref 137–147)
TOTAL PROTEIN: 6.7 g/dL (ref 6.0–8.3)

## 2014-03-23 MED ORDER — OXYCODONE HCL 5 MG PO TABS
5.0000 mg | ORAL_TABLET | ORAL | Status: DC | PRN
  Administered 2014-03-23 – 2014-03-25 (×11): 10 mg via ORAL
  Filled 2014-03-23 (×11): qty 2

## 2014-03-23 MED ORDER — POLYETHYLENE GLYCOL 3350 17 G PO PACK
17.0000 g | PACK | Freq: Every day | ORAL | Status: DC
  Administered 2014-03-23 – 2014-03-25 (×3): 17 g via ORAL
  Filled 2014-03-23 (×3): qty 1

## 2014-03-23 NOTE — Progress Notes (Signed)
Patient ID: Steven Best, male   DOB: 12-03-1965, 48 y.o.   MRN: [id number]   Subjective: No flatus or BM.  No b/v.  Tolerating fulls.  Up in chair.  Ambulating much more now.  Pain well controlled.  Less dilaudid requirement.    Objective:  Vital signs:  Filed Vitals:   03/22/14 1332 03/22/14 1720 03/22/14 2039 03/23/14 0458  BP: 142/83 133/83 138/77 126/70  Pulse: 119 117 122 111  Temp: 98.2 F (36.8 C) 98.6 F (37 C) 98.7 F (37.1 C) 99 F (37.2 C)  TempSrc: Oral Oral Oral Oral  Resp: '20 18 18 18  ' Height:      Weight:   295 lb 8 oz (134.038 kg)   SpO2: 94% 100% 98% 91%    Last BM Date: 03/19/14  Intake/Output   Yesterday:  04/25 0701 - 04/26 0700 In: 2738 [P.O.:960; I.V.:1578; IV Piggyback:200] Out: 1000 [Urine:1000] This shift:    I/O last 3 completed shifts: In: 2229 [P.O.:1320; I.V.:2403; IV Piggyback:1700] Out: 1000 [Urine:1000]    Physical Exam:  General: Pt awake/alert/oriented x4 in no acute distress  Abdomen: Soft. Nondistended. +bs. Lap site to midline, dressing removed, staples are intact, RUQ incision, dressing removed staples are intact. No evidence of peritonitis. No incarcerated hernias.    Problem List:   Active Problems:   Lymphoma   Cholecystitis, acute   Chronic kidney disease, stage V   Anemia   Thrombocytopenia, unspecified   Abdominal lymphadenopathy   Metabolic acidosis    Results:   Labs: Results for orders placed during the hospital encounter of 03/17/14 (from the past 48 hour(s))  COMPREHENSIVE METABOLIC PANEL     Status: Abnormal   Collection Time    03/22/14 10:30 AM      Result Value Ref Range   Sodium 141  137 - 147 mEq/L   Potassium 4.1  3.7 - 5.3 mEq/L   Chloride 99  96 - 112 mEq/L   CO2 19  19 - 32 mEq/L   Glucose, Bld 113 (*) 70 - 99 mg/dL   BUN 79 (*) 6 - 23 mg/dL   Creatinine, Ser 8.27 (*) 0.50 - 1.35 mg/dL   Calcium 9.9  8.4 - 10.5 mg/dL   Total Protein 7.5  6.0 - 8.3 g/dL   Albumin 3.3 (*) 3.5 -  5.2 g/dL   AST 30  0 - 37 U/L   ALT 25  0 - 53 U/L   Alkaline Phosphatase 109  39 - 117 U/L   Total Bilirubin 0.6  0.3 - 1.2 mg/dL   GFR calc non Af Amer 7 (*) >90 mL/min   GFR calc Af Amer 8 (*) >90 mL/min   Comment: (NOTE)     The eGFR has been calculated using the CKD EPI equation.     This calculation has not been validated in all clinical situations.     eGFR's persistently <90 mL/min signify possible Chronic Kidney     Disease.  CBC WITH DIFFERENTIAL     Status: Abnormal   Collection Time    03/22/14 10:30 AM      Result Value Ref Range   WBC 14.1 (*) 4.0 - 10.5 K/uL   RBC 2.82 (*) 4.22 - 5.81 MIL/uL   Hemoglobin 9.1 (*) 13.0 - 17.0 g/dL   HCT 27.1 (*) 39.0 - 52.0 %   MCV 96.1  78.0 - 100.0 fL   MCH 32.3  26.0 - 34.0 pg   MCHC 33.6  30.0 -  36.0 g/dL   RDW 13.7  11.5 - 15.5 %   Platelets 160  150 - 400 K/uL   Neutrophils Relative % 87 (*) 43 - 77 %   Lymphocytes Relative 5 (*) 12 - 46 %   Monocytes Relative 7  3 - 12 %   Eosinophils Relative 1  0 - 5 %   Basophils Relative 0  0 - 1 %   Neutro Abs 12.3 (*) 1.7 - 7.7 K/uL   Lymphs Abs 0.7  0.7 - 4.0 K/uL   Monocytes Absolute 1.0  0.1 - 1.0 K/uL   Eosinophils Absolute 0.1  0.0 - 0.7 K/uL   Basophils Absolute 0.0  0.0 - 0.1 K/uL  COMPREHENSIVE METABOLIC PANEL     Status: Abnormal   Collection Time    03/23/14  5:45 AM      Result Value Ref Range   Sodium 141  137 - 147 mEq/L   Potassium 3.5 (*) 3.7 - 5.3 mEq/L   Chloride 96  96 - 112 mEq/L   CO2 23  19 - 32 mEq/L   Glucose, Bld 108 (*) 70 - 99 mg/dL   BUN 82 (*) 6 - 23 mg/dL   Creatinine, Ser 8.70 (*) 0.50 - 1.35 mg/dL   Calcium 9.5  8.4 - 10.5 mg/dL   Total Protein 6.7  6.0 - 8.3 g/dL   Albumin 3.0 (*) 3.5 - 5.2 g/dL   AST 20  0 - 37 U/L   ALT 19  0 - 53 U/L   Alkaline Phosphatase 91  39 - 117 U/L   Total Bilirubin 0.5  0.3 - 1.2 mg/dL   GFR calc non Af Amer 6 (*) >90 mL/min   GFR calc Af Amer 7 (*) >90 mL/min   Comment: (NOTE)     The eGFR has been  calculated using the CKD EPI equation.     This calculation has not been validated in all clinical situations.     eGFR's persistently <90 mL/min signify possible Chronic Kidney     Disease.  CBC WITH DIFFERENTIAL     Status: Abnormal   Collection Time    03/23/14  5:45 AM      Result Value Ref Range   WBC 10.7 (*) 4.0 - 10.5 K/uL   RBC 2.52 (*) 4.22 - 5.81 MIL/uL   Hemoglobin 8.2 (*) 13.0 - 17.0 g/dL   HCT 23.9 (*) 39.0 - 52.0 %   MCV 94.8  78.0 - 100.0 fL   MCH 32.5  26.0 - 34.0 pg   MCHC 34.3  30.0 - 36.0 g/dL   RDW 13.6  11.5 - 15.5 %   Platelets 156  150 - 400 K/uL   Neutrophils Relative % 83 (*) 43 - 77 %   Neutro Abs 8.8 (*) 1.7 - 7.7 K/uL   Lymphocytes Relative 7 (*) 12 - 46 %   Lymphs Abs 0.7  0.7 - 4.0 K/uL   Monocytes Relative 9  3 - 12 %   Monocytes Absolute 1.0  0.1 - 1.0 K/uL   Eosinophils Relative 1  0 - 5 %   Eosinophils Absolute 0.1  0.0 - 0.7 K/uL   Basophils Relative 0  0 - 1 %   Basophils Absolute 0.0  0.0 - 0.1 K/uL    Imaging / Studies: US Renal  03/23/2014   CLINICAL DATA:  Bladder outlet obstruction; chronic renal disease.  EXAM: RENAL/URINARY TRACT ULTRASOUND COMPLETE  COMPARISON:  Right upper quadrant abdominal ultrasound,  and CT of the abdomen and pelvis, performed 03/17/2014  FINDINGS: Right Kidney:  Length: 12.7 cm. Diffusely increased parenchymal echogenicity, likely reflecting medical renal disease. No mass or hydronephrosis visualized.  Left Kidney:  Length: 12.1 cm. Difficult to fully assess. Increased parenchymal echogenicity likely reflects medical renal disease. No mass or hydronephrosis visualized.  Bladder:  Not visualized; the patient has a Foley catheter. Evaluation of the pelvis is limited due to overlying bowel gas.  Trace ascites is incidentally noted about the liver.  IMPRESSION: 1. No evidence of hydronephrosis. 2. Diffusely increased renal parenchymal echogenicity likely reflects medical renal disease. 3. Trace ascites incidentally noted  about the liver.   Electronically Signed   By: Garald Balding M.D.   On: 03/23/2014 00:40    Medications / Allergies: per chart  Antibiotics: Anti-infectives   Start     Dose/Rate Route Frequency Ordered Stop   03/19/14 2200  levofloxacin (LEVAQUIN) IVPB 500 mg     500 mg 100 mL/hr over 60 Minutes Intravenous Every 48 hours 03/17/14 1931     03/17/14 2115  levofloxacin (LEVAQUIN) IVPB 750 mg     750 mg 100 mL/hr over 90 Minutes Intravenous  Once 03/17/14 2112 03/18/14 0135   03/17/14 1945  metroNIDAZOLE (FLAGYL) IVPB 500 mg     500 mg 100 mL/hr over 60 Minutes Intravenous Every 8 hours 03/17/14 1931        Assessment/Plan  RUQ abdominal pain possible acute cholecystitis  granulomatous hepatitis/cirrhosis  Acute on CKD stage V  Upper abdominal and retroperitoneal lymphadenopathy  Splenomegaly  Hypercalcemia  Thrombocytopenia  Anemia   S/p laparoscopic cholecystectomy converted to open intra-abdominal lymph node biopsy and liver biopsy---Dr. Ninfa Linden  POD#3  -continue with full liquid diet  -add miralax -change percocet to oxycodone -ambulate, up to chair with meals, IS  -await pathology results  Erby Pian, [facility] Surgery Pager [phone] Office [phone]  03/23/2014 8:43 AM

## 2014-03-23 NOTE — Progress Notes (Signed)
Patient ID: Steven Best, male   DOB: 1966-02-16, 48 y.o.   MRN: [id number] S:feels better O:BP 128/81  Pulse 116  Temp(Src) 98.9 F (37.2 C) (Oral)  Resp 18  Ht 6\' 4"  (1.93 m)  Wt 134.038 kg (295 lb 8 oz)  BMI 35.98 kg/m2  SpO2 95%  Intake/Output Summary (Last 24 hours) at 03/23/14 1003 Last data filed at 03/23/14 0300  Gross per 24 hour  Intake   2498 ml  Output   1000 ml  Net   1498 ml   Intake/Output: I/O last 3 completed shifts: In: [id number] [P.O.:1320; I.V.:2403; IV Piggyback:1700] Out: 1000 [Urine:1000]  Intake/Output this shift:    Weight change: 0.638 kg (1 lb 6.5 oz) Gen:WD obese WM in NAD CVS:no rub Resp:cta [id number], +BS, mildly tender Ext:no edema, L AVF +T/B   Recent Labs Lab 03/17/14 1430 03/17/14 1932 03/18/14 1105 03/19/14 0711 03/20/14 0713 03/21/14 0440 03/22/14 1030 03/23/14 0545  NA 138  --  137 140 139 141 141 141  K 4.6  --  4.5 4.8 4.4 4.6 4.1 3.5*  CL 99  --  99 103 104 104 99 96  CO2 19  --  16* 17* 16* 17* 19 23  GLUCOSE 116*  --  103* 97 100* 142* 113* 108*  BUN 72*  --  72* 76* 74* 73* 79* 82*  CREATININE 7.86*  --  7.52* 7.85* 7.29* 7.44* 8.27* 8.70*  ALBUMIN 4.2  --  3.7 3.7 3.7 3.4* 3.3* 3.0*  CALCIUM 12.2* 11.1* 11.2* 11.0* 10.5 9.8 9.9 9.5  PHOS  --   --   --  6.7*  --  8.1*  --   --   AST 21  --  41* 29 22 40* 30 20  ALT 29  --  37 31 25 31 25 19    Liver Function Tests:  Recent Labs Lab 03/21/14 0440 03/22/14 1030 03/23/14 0545  AST 40* 30 20  ALT 31 25 19   ALKPHOS 133* 109 91  BILITOT 0.5 0.6 0.5  PROT 7.5 7.5 6.7  ALBUMIN 3.4* 3.3* 3.0*    Recent Labs Lab 03/17/14 1430  LIPASE 52   No results found for this basename: AMMONIA,  in the last 168 hours CBC:  Recent Labs Lab 03/19/14 0711 03/20/14 0713 03/21/14 0440 03/22/14 1030 03/23/14 0545  WBC 3.7* 3.6* 10.9* 14.1* 10.7*  NEUTROABS 2.6 2.7 9.5* 12.3* 8.8*  HGB 9.0* 8.9* 9.6* 9.1* 8.2*  HCT 26.5* 26.2* 28.1* 27.1* 23.9*  MCV 94.3 94.2  94.9 96.1 94.8  PLT 53* 69* 118* 160 156   Cardiac Enzymes: No results found for this basename: CKTOTAL, CKMB, CKMBINDEX, TROPONINI,  in the last 168 hours CBG: No results found for this basename: GLUCAP,  in the last 168 hours  Iron Studies: No results found for this basename: IRON, TIBC, TRANSFERRIN, FERRITIN,  in the last 72 hours Studies/Results: US Renal  03/23/2014   CLINICAL DATA:  Bladder outlet obstruction; chronic renal disease.  EXAM: RENAL/URINARY TRACT ULTRASOUND COMPLETE  COMPARISON:  Right upper quadrant abdominal ultrasound, and CT of the abdomen and pelvis, performed 03/17/2014  FINDINGS: Right Kidney:  Length: 12.7 cm. Diffusely increased parenchymal echogenicity, likely reflecting medical renal disease. No mass or hydronephrosis visualized.  Left Kidney:  Length: 12.1 cm. Difficult to fully assess. Increased parenchymal echogenicity likely reflects medical renal disease. No mass or hydronephrosis visualized.  Bladder:  Not visualized; the patient has a Foley catheter. Evaluation of the pelvis is limited due to  overlying bowel gas.  Trace ascites is incidentally noted about the liver.  IMPRESSION: 1. No evidence of hydronephrosis. 2. Diffusely increased renal parenchymal echogenicity likely reflects medical renal disease. 3. Trace ascites incidentally noted about the liver.   Electronically Signed   By: Garald Balding M.D.   On: 03/23/2014 00:40   . amLODipine  10 mg Oral Daily  . antiseptic oral rinse  15 mL Mouth Rinse BID  . calcitonin (salmon)  1 spray Alternating Nares Daily  . cyanocobalamin  250 mcg Oral Daily  . levofloxacin (LEVAQUIN) IV  500 mg Intravenous Q48H  . metronidazole  500 mg Intravenous Q8H  . pantoprazole  40 mg Oral BID  . polyethylene glycol  17 g Oral Daily  . sevelamer carbonate  1,600 mg Oral TID WC  . sodium chloride  3 mL Intravenous Q12H    BMET    Component Value Date/Time   NA 141 03/23/2014 0545   NA 137 05/01/2013 1033   K 3.5* 03/23/2014  0545   K 4.5 05/01/2013 1033   CL 96 03/23/2014 0545   CL 105 05/01/2013 1033   CO2 23 03/23/2014 0545   CO2 17* 05/01/2013 1033   GLUCOSE 108* 03/23/2014 0545   GLUCOSE 89 05/01/2013 1033   BUN 82* 03/23/2014 0545   BUN 85.7* 05/01/2013 1033   CREATININE 8.70* 03/23/2014 0545   CREATININE 6.6 Repeated and Verified* 05/01/2013 1033   CALCIUM 9.5 03/23/2014 0545   CALCIUM 11.1* 03/17/2014 1932   CALCIUM 9.9 05/01/2013 1033   GFRNONAA 6* 03/23/2014 0545   GFRAA 7* 03/23/2014 0545   CBC    Component Value Date/Time   WBC 10.7* 03/23/2014 0545   WBC 3.2* 05/01/2013 1033   RBC 2.52* 03/23/2014 0545   RBC 3.26* 05/01/2013 1033   HGB 8.2* 03/23/2014 0545   HGB 11.0* 05/01/2013 1033   HCT 23.9* 03/23/2014 0545   HCT 32.0* 05/01/2013 1033   PLT 156 03/23/2014 0545   PLT 110* 05/01/2013 1033   MCV 94.8 03/23/2014 0545   MCV 98.1* 05/01/2013 1033   MCH 32.5 03/23/2014 0545   MCH 33.8* 05/01/2013 1033   MCHC 34.3 03/23/2014 0545   MCHC 34.4 05/01/2013 1033   RDW 13.6 03/23/2014 0545   RDW 13.1 05/01/2013 1033   LYMPHSABS 0.7 03/23/2014 0545   LYMPHSABS 0.4* 05/01/2013 1033   MONOABS 1.0 03/23/2014 0545   MONOABS 0.5 05/01/2013 1033   EOSABS 0.1 03/23/2014 0545   EOSABS 0.1 05/01/2013 1033   BASOSABS 0.0 03/23/2014 0545   BASOSABS 0.0 05/01/2013 1033     Assessment/Plan:  1. AKI/CKD- in setting of decreased po intake and likely ischemic ATN, also with hypercalcemia. No evidence of uremia but BUN and Scr cont to increase  1. Marked increase in UOP following foley 2. Will cont to follow closely  3. Will plan for HD tomorrow, 03/24/14 if no sig improvement of Scr 4. If this sarcoidosis, he may actually regain some renal function after prednisone therapy.  Awaiting path results 2. Urinary retention vs. BOO- s/p foley with marked improvement in UOP. May be urinary retention following anesthesia vs neurogenic bladder vs. BPH. ?need for flomax or Urology eval. Contiue with foley for now, will follow and remove foley tomorrow and follow  residuals if ok with primary 3. Metabolic acidosis - probably driving his tachypnea and hyperpnea in absence of any evidence for volume overload.  1. Changed IVF to isotonic sodium bicarbonate at 75/hour; with improvement of CO2 2. Cont to  follow 4. Acute cholecystits- s/p lap chole (plus liver bx, node resection) Path pending 5. Hypercalcemia- ?sarcoidosis vs. Malignancy. Surgical path pending. ACE level elevated (and PTH suppressed) putting sarcoidosis higher on the DDX for his hypercalcemia.  1. Improved with medical management 2. Continue calcitonin nasal spray.  3. If sarcoid would require steroids but not empiric due to recent surgery. 6. SHPTH- increase renvela due to increase in Phos 7. Thrombocytopenia- per Heme/Onc, possibly related to splenomegaly. improving 8. Cirrhosis- c/b thrombocytopenia, splenomegaly; repeat bx done today - path pending 9. Diffuse intra-abdominal retroperitoneal lymphadenopathy- a perigastric node was removed 10. Vascular access- LAVF mature and ready for use if needed 11.   [place]

## 2014-03-23 NOTE — Progress Notes (Signed)
Sitting in the chair, active BS, incisions OK. Path P. Patient examined and I agree with the assessment and plan  Georganna Skeans, MD, MPH, FACS Trauma: [phone] General Surgery: [phone]  03/23/2014 11:13 AM

## 2014-03-23 NOTE — Progress Notes (Signed)
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 Denied nausea, fever or chills.  In the ED, CT scan of the abdomen showed no active complications from specific biopsy site but did show extensive upper abdominal and retroperitoneal adenopathy concerning for lymphoma. There is also mild distended gallbladder with  gallbladder thickening.? Lingular pneumonia versus atelectasis. Right adrenal adenoma. Creatinine at 7.6. Baseline seems to be between 5 and 7. Calcium 12.2. Alkaline phosphatase 143. Lipase within normal limits at 52. Platelet count is 56. Hemodynamically stable.   Assessment/Plan: Acute cholecystitis  Treated supportively with bowel rest, IV fluids, IV antibiotics (Levaquin and Flagyl) and pain management. GI, surgery and hematology consulted.  laparoscopic cholecystectomy ( showed gangrenous cholecystitis) with lymph node biopsy and liver biopsy on 4/23. Received platelet transfusion. Stable on prn percocet  Granulomatous hepatitis/cirrhosis with portal hypertension:  Diagnosed by recent liver biopsy . GI-consultation appreciated. Patient has lymphadenopathy on CT abdomen but according to IR cannot be accessed by percutaneous route. repeat liver biopsy during laparoscopy and retroperitoneal LN bx on 4/23. Pending results.  CKD 5/metabolic acidosis:  Not started dialysis yet. Apparently transplant candidate. Nephrology consultation and followup appreciated. Started on bicarb drip. Bicarb now improving but  still has AG. Creatinine worsening. Renal plans on HD if creatinine not improved in am. . Urine output seems appropriate after foley placed in..  Bladder outlet obstruction  day 2 post op. Had good UOP after foley placed in.  renal US unremarkable. Possibly due to narcotics and anesthesia. Denies any BPH like symptoms.  Will remove foley in am.  Anemia:  Multifactorial secondary to ESRD, splenomegaly and chronic disease. Stable. Transfuse if hemoglobin less than 7 g per DL.  Thrombocytopenia:  Patient seems to have chronic thrombocytopenia but has dropped from 122 on 4/15 to 56 on 4/20. Platelet counts better..Secondary to splenomegaly and sequestration. Hematology consultation appreciated-recommend transfusing platelets. Improved to 160.  Intra-abdominal/retroperitoneal lymphadenopathy:  Unclear etiology. Differential includes  lymphoma, metastatic carcinoma vs sarcoid. Follow bx   Hypercalcemia:  sarcoid (seems likely) vs malignancy. Improved with fluids.  Await lymph node biopsy. ACE level 98. Intact PTH 9.2. Continue Calcitonin nasal spray.  Hypertension:   Increased amlodipine to 10 mg daily. BP now stable  Tachycardia  likely due to metabolic acidosis   Code Status: Full  Family Communication: None at bedside  Disposition Plan: Remains inpatient    Consultants:  Gastroenterology  General surgery  Nephrology  Hematology   Procedures:  Lap cholecystectomy with intraabdominal LN  bx , liver bx on 4/23  Antibiotics:  IV levofloxacin  IV Flagyl    HPI/Subjective: Patient seen and examined this am. RUQ Pain better.   Objective: Filed Vitals:   03/23/14 0915  BP: 128/81  Pulse: 116  Temp: 98.9 F (37.2 C)  Resp: 18    Intake/Output Summary (Last 24 hours) at 03/23/14 1355 Last data filed at 03/23/14 0300  Gross per 24 hour  Intake   2498 ml  Output      0 ml  Net   2498 ml   Filed Weights   03/20/14 2135 03/21/14 2130 03/22/14 2039  Weight: 132.3  kg  (291 lb 10.7 oz) 133.4 kg (294 lb 1.5 oz) 134.038 kg (295 lb 8 oz)    Exam:   General:  Middle aged male i NAD  HEENT: no pallor, moist mucosa  Chest: clear b/l, no added sounds  CVS:S1&S2 tachycardic , no murmurs, rubs or gallop  abd: soft, dressing over laparoscopy site intact, BS+,mninimal tenderness  Ext: warm, no edema, foley in place  C NS: AAOX3  Data Reviewed: Basic Metabolic Panel:  Recent Labs Lab 03/18/14 1105 03/19/14 0711 03/20/14 0713 03/21/14 0440 03/22/14 1030 03/23/14 0545  NA 137 140 139 141 141 141  K 4.5 4.8 4.4 4.6 4.1 3.5*  CL 99 103 104 104 99 96  CO2 16* 17* 16* 17* 19 23  GLUCOSE 103* 97 100* 142* 113* 108*  BUN 72* 76* 74* 73* 79* 82*  CREATININE 7.52* 7.85* 7.29* 7.44* 8.27* 8.70*  CALCIUM 11.2* 11.0* 10.5 9.8 9.9 9.5  MG  --  2.5  --   --   --   --   PHOS  --  6.7*  --  8.1*  --   --    Liver Function Tests:  Recent Labs Lab 03/19/14 0711 03/20/14 0713 03/21/14 0440 03/22/14 1030 03/23/14 0545  AST 29 22 40* 30 20  ALT 31 25 31 25 19   ALKPHOS 195* 164* 133* 109 91  BILITOT 0.5 0.4 0.5 0.6 0.5  PROT 8.1 7.9 7.5 7.5 6.7  ALBUMIN 3.7 3.7 3.4* 3.3* 3.0*    Recent Labs Lab 03/17/14 1430  LIPASE 52   No results found for this basename: AMMONIA,  in the last 168 hours CBC:  Recent Labs Lab 03/19/14 0711 03/20/14 0713 03/21/14 0440 03/22/14 1030 03/23/14 0545  WBC 3.7* 3.6* 10.9* 14.1* 10.7*  NEUTROABS 2.6 2.7 9.5* 12.3* 8.8*  HGB 9.0* 8.9* 9.6* 9.1* 8.2*  HCT 26.5* 26.2* 28.1* 27.1* 23.9*  MCV 94.3 94.2 94.9 96.1 94.8  PLT 53* 69* 118* 160 156   Cardiac Enzymes: No results found for this basename: CKTOTAL, CKMB, CKMBINDEX, TROPONINI,  in the last 168 hours BNP (last 3 results) No results found for this basename: PROBNP,  in the last 8760 hours CBG: No results found for this basename: GLUCAP,  in the last 168 hours  Recent Results (from the past 240 hour(s))  SURGICAL PCR SCREEN     Status: Abnormal    Collection Time    03/20/14  9:35 AM      Result Value Ref Range Status   MRSA, PCR NEGATIVE  NEGATIVE Final   Staphylococcus aureus POSITIVE (*) NEGATIVE Final   Comment:            The Xpert SA Assay (FDA     approved for NASAL specimens     in patients over 5 years of age),     is one component of     a comprehensive surveillance     program.  Test performance has     been validated by [employer] for patients greater     than or equal to 75 year old.     It is not intended     to diagnose infection nor to     guide or monitor treatment.     Studies: US Renal  03/23/2014   CLINICAL DATA:  Bladder outlet obstruction; chronic renal disease.  EXAM: RENAL/URINARY TRACT ULTRASOUND COMPLETE  COMPARISON:  Right upper quadrant abdominal ultrasound, and CT of the abdomen and  pelvis, performed 03/17/2014  FINDINGS: Right Kidney:  Length: 12.7 cm. Diffusely increased parenchymal echogenicity, likely reflecting medical renal disease. No mass or hydronephrosis visualized.  Left Kidney:  Length: 12.1 cm. Difficult to fully assess. Increased parenchymal echogenicity likely reflects medical renal disease. No mass or hydronephrosis visualized.  Bladder:  Not visualized; the patient has a Foley catheter. Evaluation of the pelvis is limited due to overlying bowel gas.  Trace ascites is incidentally noted about the liver.  IMPRESSION: 1. No evidence of hydronephrosis. 2. Diffusely increased renal parenchymal echogenicity likely reflects medical renal disease. 3. Trace ascites incidentally noted about the liver.   Electronically Signed   By: Garald Balding M.D.   On: 03/23/2014 00:40    Scheduled Meds: . amLODipine  10 mg Oral Daily  . antiseptic oral rinse  15 mL Mouth Rinse BID  . calcitonin (salmon)  1 spray Alternating Nares Daily  . cyanocobalamin  250 mcg Oral Daily  . levofloxacin (LEVAQUIN) IV  500 mg Intravenous Q48H  . metronidazole  500 mg Intravenous Q8H  . pantoprazole  40 mg Oral BID   . polyethylene glycol  17 g Oral Daily  . sevelamer carbonate  1,600 mg Oral TID WC  . sodium chloride  3 mL Intravenous Q12H   Continuous Infusions: .  sodium bicarbonate  infusion 1000 mL 75 mL/hr at 03/22/14 2338      Time spent: 35 minutes    Velora Horstman  [facility] Pager [phone]. If 7PM-7AM, please contact night-coverage at [email or web address], password [email or web address] 03/23/2014, 1:55 PM  LOS: 6 days

## 2014-03-23 NOTE — Progress Notes (Signed)
03/23/2014 7:09 AM  Patient walked to nurses station twice with the NT. Showed no signs of pain or distressed. Will inform the day shift RN   Maggie Font, RN

## 2014-03-24 LAB — CBC WITH DIFFERENTIAL/PLATELET
Basophils Absolute: 0 10*3/uL (ref 0.0–0.1)
Basophils Relative: 0 % (ref 0–1)
EOS PCT: 3 % (ref 0–5)
Eosinophils Absolute: 0.2 10*3/uL (ref 0.0–0.7)
HCT: 23.5 % — ABNORMAL LOW (ref 39.0–52.0)
Hemoglobin: 8.1 g/dL — ABNORMAL LOW (ref 13.0–17.0)
LYMPHS ABS: 0.6 10*3/uL — AB (ref 0.7–4.0)
Lymphocytes Relative: 8 % — ABNORMAL LOW (ref 12–46)
MCH: 32.4 pg (ref 26.0–34.0)
MCHC: 34.5 g/dL (ref 30.0–36.0)
MCV: 94 fL (ref 78.0–100.0)
MONO ABS: 0.7 10*3/uL (ref 0.1–1.0)
Monocytes Relative: 9 % (ref 3–12)
Neutro Abs: 6.4 10*3/uL (ref 1.7–7.7)
Neutrophils Relative %: 80 % — ABNORMAL HIGH (ref 43–77)
Platelets: 196 10*3/uL (ref 150–400)
RBC: 2.5 MIL/uL — AB (ref 4.22–5.81)
RDW: 13.1 % (ref 11.5–15.5)
WBC: 8 10*3/uL (ref 4.0–10.5)

## 2014-03-24 LAB — COMPREHENSIVE METABOLIC PANEL
ALT: 15 U/L (ref 0–53)
AST: 15 U/L (ref 0–37)
Albumin: 2.9 g/dL — ABNORMAL LOW (ref 3.5–5.2)
Alkaline Phosphatase: 88 U/L (ref 39–117)
BUN: 83 mg/dL — ABNORMAL HIGH (ref 6–23)
CHLORIDE: 93 meq/L — AB (ref 96–112)
CO2: 25 mEq/L (ref 19–32)
CREATININE: 9.03 mg/dL — AB (ref 0.50–1.35)
Calcium: 9.6 mg/dL (ref 8.4–10.5)
GFR calc Af Amer: 7 mL/min — ABNORMAL LOW (ref >90)
GFR calc non Af Amer: 6 mL/min — ABNORMAL LOW (ref >90)
Glucose, Bld: 105 mg/dL — ABNORMAL HIGH (ref 70–99)
Potassium: 3.5 mEq/L — ABNORMAL LOW (ref 3.7–5.3)
SODIUM: 137 meq/L (ref 137–147)
Total Bilirubin: 0.4 mg/dL (ref 0.3–1.2)
Total Protein: 6.7 g/dL (ref 6.0–8.3)

## 2014-03-24 NOTE — Progress Notes (Signed)
[facility] ROUNDING NOTE   Subjective:   Interval History:  No complaints sitting out of bed no uremic sign or symptoms  lymph node biopsy and liver biopsy on 4/23   Objective:  Vital signs in last 24 hours:  Temp:  [98 F (36.7 C)-98.7 F (37.1 C)] 98.7 F (37.1 C) (04/27 0824) Pulse Rate:  [94-99] 94 (04/27 0824) Resp:  [18-20] 20 (04/27 0824) BP: (129-151)/(67-83) 151/81 mmHg (04/27 0824) SpO2:  [92 %-99 %] 98 % (04/27 0824) Weight:  IT:9738046.129 kg (295 lb 11.2 oz)] 134.129 kg (295 lb 11.2 oz) (04/26 2100)  Weight change: 0.091 kg (3.2 oz) Filed Weights   03/21/14 2130 03/22/14 2039 03/23/14 2100  Weight: 133.4 kg (294 lb 1.5 oz) 134.038 kg (295 lb 8 oz) 134.129 kg (295 lb 11.2 oz)    Intake/Output: I/O last 3 completed shifts: In: 3237.5 [P.O.:1080; I.V.:1757.5; IV Piggyback:400] Out: -    Intake/Output this shift:  Total I/O In: 360 [P.O.:360] Out: 1200 [Urine:1200]  CVS- RRR RS- CTA ABD- BS present soft non-distended EXT- no edema   Basic Metabolic Panel:  Recent Labs Lab 03/18/14 1105 03/19/14 0711 03/20/14 0713 03/21/14 0440 03/22/14 1030 03/23/14 0545 03/24/14 0603  NA 137 140 139 141 141 141 137  K 4.5 4.8 4.4 4.6 4.1 3.5* 3.5*  CL 99 103 104 104 99 96 93*  CO2 16* 17* 16* 17* 19 23 25   GLUCOSE 103* 97 100* 142* 113* 108* 105*  BUN 72* 76* 74* 73* 79* 82* 83*  CREATININE 7.52* 7.85* 7.29* 7.44* 8.27* 8.70* 9.03*  CALCIUM 11.2* 11.0* 10.5 9.8 9.9 9.5 9.6  MG  --  2.5  --   --   --   --   --   PHOS  --  6.7*  --  8.1*  --   --   --     Liver Function Tests:  Recent Labs Lab 03/20/14 0713 03/21/14 0440 03/22/14 1030 03/23/14 0545 03/24/14 0603  AST 22 40* 30 20 15   ALT 25 31 25 19 15   ALKPHOS 164* 133* 109 91 88  BILITOT 0.4 0.5 0.6 0.5 0.4  PROT 7.9 7.5 7.5 6.7 6.7  ALBUMIN 3.7 3.4* 3.3* 3.0* 2.9*    Recent Labs Lab 03/17/14 1430  LIPASE 52   No results found for this basename: AMMONIA,  in the last 168  hours  CBC:  Recent Labs Lab 03/20/14 0713 03/21/14 0440 03/22/14 1030 03/23/14 0545 03/24/14 0603  WBC 3.6* 10.9* 14.1* 10.7* 8.0  NEUTROABS 2.7 9.5* 12.3* 8.8* 6.4  HGB 8.9* 9.6* 9.1* 8.2* 8.1*  HCT 26.2* 28.1* 27.1* 23.9* 23.5*  MCV 94.2 94.9 96.1 94.8 94.0  PLT 69* 118* 160 156 196    Cardiac Enzymes: No results found for this basename: CKTOTAL, CKMB, CKMBINDEX, TROPONINI,  in the last 168 hours  BNP: No components found with this basename: POCBNP,   CBG: No results found for this basename: GLUCAP,  in the last 168 hours  Microbiology: Results for orders placed during the hospital encounter of 03/17/14  SURGICAL PCR SCREEN     Status: Abnormal   Collection Time    03/20/14  9:35 AM      Result Value Ref Range Status   MRSA, PCR NEGATIVE  NEGATIVE Final   Staphylococcus aureus POSITIVE (*) NEGATIVE Final   Comment:            The Xpert SA Assay (FDA     approved for NASAL specimens  in patients over 22 years of age),     is one component of     a comprehensive surveillance     program.  Test performance has     been validated by [employer] for patients greater     than or equal to 15 year old.     It is not intended     to diagnose infection nor to     guide or monitor treatment.    Coagulation Studies: No results found for this basename: LABPROT, INR,  in the last 72 hours  Urinalysis: No results found for this basename: COLORURINE, APPERANCEUR, LABSPEC, PHURINE, GLUCOSEU, HGBUR, BILIRUBINUR, KETONESUR, PROTEINUR, UROBILINOGEN, NITRITE, LEUKOCYTESUR,  in the last 72 hours    Imaging: US Renal  03/23/2014   CLINICAL DATA:  Bladder outlet obstruction; chronic renal disease.  EXAM: RENAL/URINARY TRACT ULTRASOUND COMPLETE  COMPARISON:  Right upper quadrant abdominal ultrasound, and CT of the abdomen and pelvis, performed 03/17/2014  FINDINGS: Right Kidney:  Length: 12.7 cm. Diffusely increased parenchymal echogenicity, likely reflecting medical  renal disease. No mass or hydronephrosis visualized.  Left Kidney:  Length: 12.1 cm. Difficult to fully assess. Increased parenchymal echogenicity likely reflects medical renal disease. No mass or hydronephrosis visualized.  Bladder:  Not visualized; the patient has a Foley catheter. Evaluation of the pelvis is limited due to overlying bowel gas.  Trace ascites is incidentally noted about the liver.  IMPRESSION: 1. No evidence of hydronephrosis. 2. Diffusely increased renal parenchymal echogenicity likely reflects medical renal disease. 3. Trace ascites incidentally noted about the liver.   Electronically Signed   By: Garald Balding M.D.   On: 03/23/2014 00:40     Medications:   .  sodium bicarbonate  infusion 1000 mL 75 mL/hr at 03/23/14 2047   . amLODipine  10 mg Oral Daily  . antiseptic oral rinse  15 mL Mouth Rinse BID  . calcitonin (salmon)  1 spray Alternating Nares Daily  . cyanocobalamin  250 mcg Oral Daily  . levofloxacin (LEVAQUIN) IV  500 mg Intravenous Q48H  . metronidazole  500 mg Intravenous Q8H  . pantoprazole  40 mg Oral BID  . polyethylene glycol  17 g Oral Daily  . sevelamer carbonate  1,600 mg Oral TID WC  . 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 Stage 5 Chronic renal disease that is stable. He is tolerating the low GFR very well. Clinincally he does not need dialysis . He is anxiously awaiting biopsy results today.  He can follow up with Dr Moshe Cipro as an outpatient. We could sign off today if the primary  service is aggreeable        LOS: 7 Sherril Croon @TODAY @12 :02 PM

## 2014-03-24 NOTE — Progress Notes (Signed)
Patient ID: [facility], male   DOB: [birth year], 48 y.o.   MRN: [id number]   Subjective: Ambulating.  Tolerating fulls.  No n/v.  Passing flatus, no BM.    Objective:  Vital signs:  Filed Vitals:   03/23/14 0915 03/23/14 1718 03/23/14 2100 03/24/14 0500  BP: 128/81 148/73 140/83 129/67  Pulse: 116 99 96 99  Temp: 98.9 F (37.2 C) 98 F (36.7 C) 98.5 F (36.9 C) 98.5 F (36.9 C)  TempSrc: Oral Oral Oral Oral  Resp: '18 20 18 18  ' Height:      Weight:   295 lb 11.2 oz (134.129 kg)   SpO2: 95% 99% 96% 92%    Last BM Date: 03/19/14  Intake/Output   Yesterday:  04/26 0701 - 04/27 0700 In: 2456.3 [P.O.:1080; I.V.:1076.3; IV Piggyback:300] Out: -  This shift:    I/O last 3 completed shifts: In: 3237.5 [P.O.:1080; I.V.:1757.5; IV Piggyback:400] Out: -     Physical Exam:  General: Pt awake/alert/oriented x4 in no acute distress  Abdomen: Soft. Nondistended. +bs. Incisions are c/d/i.  No evidence of peritonitis. No incarcerated hernias.   Problem List:   Active Problems:   Lymphoma   Cholecystitis, acute   Chronic kidney disease, stage V   Anemia   Thrombocytopenia, unspecified   Abdominal lymphadenopathy   Metabolic acidosis    Results:   Labs: Results for orders placed during the hospital encounter of 03/17/14 (from the past 48 hour(s))  COMPREHENSIVE METABOLIC PANEL     Status: Abnormal   Collection Time    03/22/14 10:30 AM      Result Value Ref Range   Sodium 141  137 - 147 mEq/L   Potassium 4.1  3.7 - 5.3 mEq/L   Chloride 99  96 - 112 mEq/L   CO2 19  19 - 32 mEq/L   Glucose, Bld 113 (*) 70 - 99 mg/dL   BUN 79 (*) 6 - 23 mg/dL   Creatinine, Ser 8.27 (*) 0.50 - 1.35 mg/dL   Calcium 9.9  8.4 - 10.5 mg/dL   Total Protein 7.5  6.0 - 8.3 g/dL   Albumin 3.3 (*) 3.5 - 5.2 g/dL   AST 30  0 - 37 U/L   ALT 25  0 - 53 U/L   Alkaline Phosphatase 109  39 - 117 U/L   Total Bilirubin 0.6  0.3 - 1.2 mg/dL   GFR calc non Af Amer 7 (*) >90 mL/min   GFR  calc Af Amer 8 (*) >90 mL/min   Comment: (NOTE)     The eGFR has been calculated using the CKD EPI equation.     This calculation has not been validated in all clinical situations.     eGFR's persistently <90 mL/min signify possible Chronic Kidney     Disease.  CBC WITH DIFFERENTIAL     Status: Abnormal   Collection Time    03/22/14 10:30 AM      Result Value Ref Range   WBC 14.1 (*) 4.0 - 10.5 K/uL   RBC 2.82 (*) 4.22 - 5.81 MIL/uL   Hemoglobin 9.1 (*) 13.0 - 17.0 g/dL   HCT 27.1 (*) 39.0 - 52.0 %   MCV 96.1  78.0 - 100.0 fL   MCH 32.3  26.0 - 34.0 pg   MCHC 33.6  30.0 - 36.0 g/dL   RDW 13.7  11.5 - 15.5 %   Platelets 160  150 - 400 K/uL   Neutrophils Relative % 87 (*) 43 -  77 %   Lymphocytes Relative 5 (*) 12 - 46 %   Monocytes Relative 7  3 - 12 %   Eosinophils Relative 1  0 - 5 %   Basophils Relative 0  0 - 1 %   Neutro Abs 12.3 (*) 1.7 - 7.7 K/uL   Lymphs Abs 0.7  0.7 - 4.0 K/uL   Monocytes Absolute 1.0  0.1 - 1.0 K/uL   Eosinophils Absolute 0.1  0.0 - 0.7 K/uL   Basophils Absolute 0.0  0.0 - 0.1 K/uL  COMPREHENSIVE METABOLIC PANEL     Status: Abnormal   Collection Time    03/23/14  5:45 AM      Result Value Ref Range   Sodium 141  137 - 147 mEq/L   Potassium 3.5 (*) 3.7 - 5.3 mEq/L   Chloride 96  96 - 112 mEq/L   CO2 23  19 - 32 mEq/L   Glucose, Bld 108 (*) 70 - 99 mg/dL   BUN 82 (*) 6 - 23 mg/dL   Creatinine, Ser 8.70 (*) 0.50 - 1.35 mg/dL   Calcium 9.5  8.4 - 10.5 mg/dL   Total Protein 6.7  6.0 - 8.3 g/dL   Albumin 3.0 (*) 3.5 - 5.2 g/dL   AST 20  0 - 37 U/L   ALT 19  0 - 53 U/L   Alkaline Phosphatase 91  39 - 117 U/L   Total Bilirubin 0.5  0.3 - 1.2 mg/dL   GFR calc non Af Amer 6 (*) >90 mL/min   GFR calc Af Amer 7 (*) >90 mL/min   Comment: (NOTE)     The eGFR has been calculated using the CKD EPI equation.     This calculation has not been validated in all clinical situations.     eGFR's persistently <90 mL/min signify possible Chronic Kidney      Disease.  CBC WITH DIFFERENTIAL     Status: Abnormal   Collection Time    03/23/14  5:45 AM      Result Value Ref Range   WBC 10.7 (*) 4.0 - 10.5 K/uL   RBC 2.52 (*) 4.22 - 5.81 MIL/uL   Hemoglobin 8.2 (*) 13.0 - 17.0 g/dL   HCT 23.9 (*) 39.0 - 52.0 %   MCV 94.8  78.0 - 100.0 fL   MCH 32.5  26.0 - 34.0 pg   MCHC 34.3  30.0 - 36.0 g/dL   RDW 13.6  11.5 - 15.5 %   Platelets 156  150 - 400 K/uL   Neutrophils Relative % 83 (*) 43 - 77 %   Neutro Abs 8.8 (*) 1.7 - 7.7 K/uL   Lymphocytes Relative 7 (*) 12 - 46 %   Lymphs Abs 0.7  0.7 - 4.0 K/uL   Monocytes Relative 9  3 - 12 %   Monocytes Absolute 1.0  0.1 - 1.0 K/uL   Eosinophils Relative 1  0 - 5 %   Eosinophils Absolute 0.1  0.0 - 0.7 K/uL   Basophils Relative 0  0 - 1 %   Basophils Absolute 0.0  0.0 - 0.1 K/uL  COMPREHENSIVE METABOLIC PANEL     Status: Abnormal   Collection Time    03/24/14  6:03 AM      Result Value Ref Range   Sodium 137  137 - 147 mEq/L   Potassium 3.5 (*) 3.7 - 5.3 mEq/L   Chloride 93 (*) 96 - 112 mEq/L   CO2 25  19 -  32 mEq/L   Glucose, Bld 105 (*) 70 - 99 mg/dL   BUN 83 (*) 6 - 23 mg/dL   Creatinine, Ser 9.03 (*) 0.50 - 1.35 mg/dL   Calcium 9.6  8.4 - 10.5 mg/dL   Total Protein 6.7  6.0 - 8.3 g/dL   Albumin 2.9 (*) 3.5 - 5.2 g/dL   AST 15  0 - 37 U/L   ALT 15  0 - 53 U/L   Alkaline Phosphatase 88  39 - 117 U/L   Total Bilirubin 0.4  0.3 - 1.2 mg/dL   GFR calc non Af Amer 6 (*) >90 mL/min   GFR calc Af Amer 7 (*) >90 mL/min   Comment: (NOTE)     The eGFR has been calculated using the CKD EPI equation.     This calculation has not been validated in all clinical situations.     eGFR's persistently <90 mL/min signify possible Chronic Kidney     Disease.  CBC WITH DIFFERENTIAL     Status: Abnormal   Collection Time    03/24/14  6:03 AM      Result Value Ref Range   WBC 8.0  4.0 - 10.5 K/uL   RBC 2.50 (*) 4.22 - 5.81 MIL/uL   Hemoglobin 8.1 (*) 13.0 - 17.0 g/dL   HCT 23.5 (*) 39.0 - 52.0 %   MCV  94.0  78.0 - 100.0 fL   MCH 32.4  26.0 - 34.0 pg   MCHC 34.5  30.0 - 36.0 g/dL   RDW 13.1  11.5 - 15.5 %   Platelets 196  150 - 400 K/uL   Neutrophils Relative % 80 (*) 43 - 77 %   Neutro Abs 6.4  1.7 - 7.7 K/uL   Lymphocytes Relative 8 (*) 12 - 46 %   Lymphs Abs 0.6 (*) 0.7 - 4.0 K/uL   Monocytes Relative 9  3 - 12 %   Monocytes Absolute 0.7  0.1 - 1.0 K/uL   Eosinophils Relative 3  0 - 5 %   Eosinophils Absolute 0.2  0.0 - 0.7 K/uL   Basophils Relative 0  0 - 1 %   Basophils Absolute 0.0  0.0 - 0.1 K/uL    Imaging / Studies: US Renal  03/23/2014   CLINICAL DATA:  Bladder outlet obstruction; chronic renal disease.  EXAM: RENAL/URINARY TRACT ULTRASOUND COMPLETE  COMPARISON:  Right upper quadrant abdominal ultrasound, and CT of the abdomen and pelvis, performed 03/17/2014  FINDINGS: Right Kidney:  Length: 12.7 cm. Diffusely increased parenchymal echogenicity, likely reflecting medical renal disease. No mass or hydronephrosis visualized.  Left Kidney:  Length: 12.1 cm. Difficult to fully assess. Increased parenchymal echogenicity likely reflects medical renal disease. No mass or hydronephrosis visualized.  Bladder:  Not visualized; the patient has a Foley catheter. Evaluation of the pelvis is limited due to overlying bowel gas.  Trace ascites is incidentally noted about the liver.  IMPRESSION: 1. No evidence of hydronephrosis. 2. Diffusely increased renal parenchymal echogenicity likely reflects medical renal disease. 3. Trace ascites incidentally noted about the liver.   Electronically Signed   By: Garald Balding M.D.   On: 03/23/2014 00:40    Medications / Allergies: per chart  Antibiotics: Anti-infectives   Start     Dose/Rate Route Frequency Ordered Stop   03/19/14 2200  levofloxacin (LEVAQUIN) IVPB 500 mg     500 mg 100 mL/hr over 60 Minutes Intravenous Every 48 hours 03/17/14 1931     03/17/14 2115  levofloxacin (LEVAQUIN) IVPB 750 mg     750 mg 100 mL/hr over 90 Minutes  Intravenous  Once 03/17/14 2112 03/18/14 0135   03/17/14 1945  metroNIDAZOLE (FLAGYL) IVPB 500 mg     500 mg 100 mL/hr over 60 Minutes Intravenous Every 8 hours 03/17/14 1931        Assessment/Plan  RUQ abdominal pain possible acute cholecystitis  granulomatous hepatitis/cirrhosis  Acute on CKD stage V  Upper abdominal and retroperitoneal lymphadenopathy  Splenomegaly  Hypercalcemia  Thrombocytopenia  Anemia   S/p laparoscopic cholecystectomy converted to open intra-abdominal lymph node biopsy and liver biopsy---Dr. Ninfa Linden  POD#4  -advance to soft diet -c/w miralax  -continue current pain regimen -ambulate, up to chair with meals, IS  -await pathology results  Erby Pian, [facility] Pager [phone] Office [phone]  03/24/2014 10:21 AM

## 2014-03-24 NOTE — Progress Notes (Signed)
Pt A&OX4, VSS. Pt ambulating entire length of hall with wife.  Pt tolerating well.  Pt on RA.

## 2014-03-24 NOTE — Progress Notes (Signed)
I have seen and examined the pt and agree with NP-Riebock's progress note. Adv diet as tol Looking well from surgery standpoint

## 2014-03-24 NOTE — Progress Notes (Signed)
Foley catheter removed

## 2014-03-24 NOTE — Progress Notes (Signed)
Pt voided 250 cc's yellow urine after catheter removal

## 2014-03-24 NOTE — Progress Notes (Signed)
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 Denied nausea, fever or chills.  In the ED, CT scan of the abdomen showed no active complications from specific biopsy site but did show extensive upper abdominal and retroperitoneal adenopathy concerning for lymphoma. There is also mild distended gallbladder with  gallbladder thickening.? Lingular pneumonia versus atelectasis. Right adrenal adenoma. Creatinine at 7.6. Baseline seems to be between 5 and 7. Calcium 12.2. Alkaline phosphatase 143. Lipase within normal limits at 52. Platelet count is 56. Hemodynamically stable.   Assessment/Plan: Acute cholecystitis  Treated supportively with bowel rest, IV fluids, IV antibiotics (Levaquin and Flagyl) and pain management. GI, surgery and hematology consulted.  laparoscopic cholecystectomy ( showed gangrenous cholecystitis) with lymph node biopsy and liver biopsy on 4/23. Received platelet transfusion. Stable on prn percocet  Granulomatous hepatitis/cirrhosis with portal hypertension:  Diagnosed by recent liver biopsy . GI-consultation appreciated. liver biopsy during laparoscopy and retroperitoneal LN bx on 4/23. Pending results.  CKD 5/metabolic acidosis:  Not started dialysis yet. Apparently transplant candidate. Nephrology consultation and followup appreciated. Started on bicarb drip. Bicarb now improving but still has AG. Creatinine worsening. Renal plans on HD if creatinine not improved in am. . Urine output seems  appropriate after foley placed in.. D/c foley today.   Hyperphosphatemia  secondary to ESRD. On binders.  Bladder outlet obstruction  day 2 post op. Had good UOP after foley placed in.  renal US unremarkable. Possibly due to narcotics and anesthesia. Denies any BPH like symptoms.  Will remove foley.  Anemia:  Multifactorial secondary to ESRD, splenomegaly and chronic disease. Stable. Transfuse if hemoglobin less than 7 g per DL.  Thrombocytopenia:  Patient seems to have chronic thrombocytopenia but has dropped from 122 on 4/15 to 56 on 4/20. Platelet counts better..Secondary to splenomegaly and sequestration. Hematology consultation appreciated-recommend transfusing platelets. Improved to 160.  Intra-abdominal/retroperitoneal lymphadenopathy:  Unclear etiology. Differential includes  lymphoma, metastatic carcinoma vs sarcoid. Follow bx result   Hypercalcemia:  sarcoid (seems likely) vs malignancy. Improved with fluids.  Await lymph node biopsy. ACE level 98. Intact PTH 9.2. Continue Calcitonin nasal spray.  Hypertension:   Increased amlodipine to 10 mg daily. BP now stable  Tachycardia  likely due to metabolic acidosis. Stable at present   Code Status: Full  Family Communication: wife at bedside  Disposition Plan: Remains inpatient    Consultants:  Gastroenterology  General surgery  Nephrology  Hematology   Procedures:  Lap cholecystectomy with intraabdominal LN  bx , liver bx on 4/23  Antibiotics:  IV levofloxacin (4/20>>) IV Flagyl (4/20>>) ( plan for a 10-14  day course)   HPI/Subjective: Patient seen and examined this am. RUQ Pain better.   Objective: Filed Vitals:   03/24/14 0824  BP: 151/81  Pulse: 94  Temp: 98.7 F (37.1 C)  Resp: 20    Intake/Output Summary (Last 24 hours) at 03/24/14 1123 Last data filed at 03/24/14 0900  Gross per 24 hour  Intake 2456.25 ml  Output   1200 ml  Net 1256.25 ml   Filed Weights   03/21/14 2130 03/22/14 2039  03/23/14 2100  Weight: 133.4 kg (  294 lb 1.5 oz) 134.038 kg (295 lb 8 oz) 134.129 kg (295 lb 11.2 oz)    Exam:   General:  Middle aged male in NAD  HEENT: no pallor, moist mucosa  Chest: clear b/l, no added sounds  CVS:S1&S2 tachycardic , no murmurs, rubs or gallop  abd: soft, dressing over laparoscopy site intact, BS+,minimal tenderness  Ext: warm, no edema, foley in place  CNS: AAOX3  Data Reviewed: Basic Metabolic Panel:  Recent Labs Lab 03/18/14 1105 03/19/14 0711 03/20/14 0713 03/21/14 0440 03/22/14 1030 03/23/14 0545 03/24/14 0603  NA 137 140 139 141 141 141 137  K 4.5 4.8 4.4 4.6 4.1 3.5* 3.5*  CL 99 103 104 104 99 96 93*  CO2 16* 17* 16* 17* 19 23 25   GLUCOSE 103* 97 100* 142* 113* 108* 105*  BUN 72* 76* 74* 73* 79* 82* 83*  CREATININE 7.52* 7.85* 7.29* 7.44* 8.27* 8.70* 9.03*  CALCIUM 11.2* 11.0* 10.5 9.8 9.9 9.5 9.6  MG  --  2.5  --   --   --   --   --   PHOS  --  6.7*  --  8.1*  --   --   --    Liver Function Tests:  Recent Labs Lab 03/20/14 0713 03/21/14 0440 03/22/14 1030 03/23/14 0545 03/24/14 0603  AST 22 40* 30 20 15   ALT 25 31 25 19 15   ALKPHOS 164* 133* 109 91 88  BILITOT 0.4 0.5 0.6 0.5 0.4  PROT 7.9 7.5 7.5 6.7 6.7  ALBUMIN 3.7 3.4* 3.3* 3.0* 2.9*    Recent Labs Lab 03/17/14 1430  LIPASE 52   No results found for this basename: AMMONIA,  in the last 168 hours CBC:  Recent Labs Lab 03/20/14 0713 03/21/14 0440 03/22/14 1030 03/23/14 0545 03/24/14 0603  WBC 3.6* 10.9* 14.1* 10.7* 8.0  NEUTROABS 2.7 9.5* 12.3* 8.8* 6.4  HGB 8.9* 9.6* 9.1* 8.2* 8.1*  HCT 26.2* 28.1* 27.1* 23.9* 23.5*  MCV 94.2 94.9 96.1 94.8 94.0  PLT 69* 118* 160 156 196   Cardiac Enzymes: No results found for this basename: CKTOTAL, CKMB, CKMBINDEX, TROPONINI,  in the last 168 hours BNP (last 3 results) No results found for this basename: PROBNP,  in the last 8760 hours CBG: No results found for this basename: GLUCAP,  in the last 168  hours  Recent Results (from the past 240 hour(s))  SURGICAL PCR SCREEN     Status: Abnormal   Collection Time    03/20/14  9:35 AM      Result Value Ref Range Status   MRSA, PCR NEGATIVE  NEGATIVE Final   Staphylococcus aureus POSITIVE (*) NEGATIVE Final   Comment:            The Xpert SA Assay (FDA     approved for NASAL specimens     in patients over 45 years of age),     is one component of     a comprehensive surveillance     program.  Test performance has     been validated by [employer] for patients greater     than or equal to 63 year old.     It is not intended     to diagnose infection nor to     guide or monitor treatment.     Studies: US Renal  03/23/2014   CLINICAL DATA:  Bladder outlet obstruction; chronic renal disease.  EXAM: RENAL/URINARY TRACT ULTRASOUND COMPLETE  COMPARISON:  Right upper quadrant abdominal ultrasound, and CT of the abdomen and pelvis, performed 03/17/2014  FINDINGS: Right Kidney:  Length: 12.7 cm. Diffusely increased parenchymal echogenicity, likely reflecting medical renal disease. No mass or hydronephrosis visualized.  Left Kidney:  Length: 12.1 cm. Difficult to fully assess. Increased parenchymal echogenicity likely reflects medical renal disease. No mass or hydronephrosis visualized.  Bladder:  Not visualized; the patient has a Foley catheter. Evaluation of the pelvis is limited due to overlying bowel gas.  Trace ascites is incidentally noted about the liver.  IMPRESSION: 1. No evidence of hydronephrosis. 2. Diffusely increased renal parenchymal echogenicity likely reflects medical renal disease. 3. Trace ascites incidentally noted about the liver.   Electronically Signed   By: Garald Balding M.D.   On: 03/23/2014 00:40    Scheduled Meds: . amLODipine  10 mg Oral Daily  . antiseptic oral rinse  15 mL Mouth Rinse BID  . calcitonin (salmon)  1 spray Alternating Nares Daily  . cyanocobalamin  250 mcg Oral Daily  . levofloxacin (LEVAQUIN) IV   500 mg Intravenous Q48H  . metronidazole  500 mg Intravenous Q8H  . pantoprazole  40 mg Oral BID  . polyethylene glycol  17 g Oral Daily  . sevelamer carbonate  1,600 mg Oral TID WC  . sodium chloride  3 mL Intravenous Q12H   Continuous Infusions: .  sodium bicarbonate  infusion 1000 mL 75 mL/hr at 03/23/14 2047      Time spent: 25 minutes    Breslin Hemann  [facility] Pager [phone]. If 7PM-7AM, please contact night-coverage at [email or web address], password [facility] 03/24/2014, 11:23 AM  LOS: 7 days

## 2014-03-25 ENCOUNTER — Encounter (HOSPITAL_COMMUNITY): Payer: Self-pay | Admitting: Surgery

## 2014-03-25 DIAGNOSIS — D869 Sarcoidosis, unspecified: Secondary | ICD-10-CM | POA: Diagnosis present

## 2014-03-25 LAB — COMPREHENSIVE METABOLIC PANEL
ALK PHOS: 88 U/L (ref 39–117)
ALT: 10 U/L (ref 0–53)
AST: 15 U/L (ref 0–37)
Albumin: 2.9 g/dL — ABNORMAL LOW (ref 3.5–5.2)
BUN: 82 mg/dL — AB (ref 6–23)
CO2: 25 mEq/L (ref 19–32)
CREATININE: 9.26 mg/dL — AB (ref 0.50–1.35)
Calcium: 9.2 mg/dL (ref 8.4–10.5)
Chloride: 88 mEq/L — ABNORMAL LOW (ref 96–112)
GFR calc non Af Amer: 6 mL/min — ABNORMAL LOW (ref >90)
GFR, EST AFRICAN AMERICAN: 7 mL/min — AB (ref >90)
GLUCOSE: 121 mg/dL — AB (ref 70–99)
Potassium: 3.3 mEq/L — ABNORMAL LOW (ref 3.7–5.3)
Sodium: 134 mEq/L — ABNORMAL LOW (ref 137–147)
Total Bilirubin: 0.5 mg/dL (ref 0.3–1.2)
Total Protein: 6.6 g/dL (ref 6.0–8.3)

## 2014-03-25 LAB — CBC WITH DIFFERENTIAL/PLATELET
Basophils Absolute: 0 10*3/uL (ref 0.0–0.1)
Basophils Relative: 0 % (ref 0–1)
Eosinophils Absolute: 0.2 10*3/uL (ref 0.0–0.7)
Eosinophils Relative: 3 % (ref 0–5)
HEMATOCRIT: 24.5 % — AB (ref 39.0–52.0)
Hemoglobin: 8.2 g/dL — ABNORMAL LOW (ref 13.0–17.0)
LYMPHS PCT: 10 % — AB (ref 12–46)
Lymphs Abs: 0.6 10*3/uL — ABNORMAL LOW (ref 0.7–4.0)
MCH: 31.9 pg (ref 26.0–34.0)
MCHC: 33.5 g/dL (ref 30.0–36.0)
MCV: 95.3 fL (ref 78.0–100.0)
Monocytes Absolute: 0.5 10*3/uL (ref 0.1–1.0)
Monocytes Relative: 8 % (ref 3–12)
NEUTROS ABS: 5 10*3/uL (ref 1.7–7.7)
Neutrophils Relative %: 79 % — ABNORMAL HIGH (ref 43–77)
Platelets: 195 10*3/uL (ref 150–400)
RBC: 2.57 MIL/uL — AB (ref 4.22–5.81)
RDW: 12.8 % (ref 11.5–15.5)
WBC: 6.4 10*3/uL (ref 4.0–10.5)

## 2014-03-25 MED ORDER — POLYETHYLENE GLYCOL 3350 17 G PO PACK: 17.0000 g | PACK | Freq: Every day | ORAL | Status: DC

## 2014-03-25 MED ORDER — HYDROCODONE-ACETAMINOPHEN 10-325 MG PO TABS
2.0000 | ORAL_TABLET | Freq: Four times a day (QID) | ORAL | Status: DC | PRN
Start: 2014-03-25 — End: 2018-10-23

## 2014-03-25 MED ORDER — CALCITONIN (SALMON) 200 UNIT/ACT NA SOLN: 1.0000 | Freq: Every day | NASAL | Status: DC

## 2014-03-25 MED ORDER — PREDNISONE 20 MG PO TABS: 40.0000 mg | ORAL_TABLET | Freq: Every day | ORAL | Status: DC

## 2014-03-25 MED ORDER — POTASSIUM CHLORIDE CRYS ER 20 MEQ PO TBCR
40.0000 meq | EXTENDED_RELEASE_TABLET | Freq: Once | ORAL | Status: AC
  Administered 2014-03-25: 40 meq via ORAL
  Filled 2014-03-25: qty 2

## 2014-03-25 MED ORDER — HYDROCORTISONE VALERATE 0.2 % EX OINT: TOPICAL_OINTMENT | Freq: Two times a day (BID) | CUTANEOUS | Status: DC

## 2014-03-25 MED ORDER — PANTOPRAZOLE SODIUM 40 MG PO TBEC: 40.0000 mg | DELAYED_RELEASE_TABLET | Freq: Every day | ORAL | Status: DC

## 2014-03-25 NOTE — Progress Notes (Signed)
Utilization review completed.  

## 2014-03-25 NOTE — Progress Notes (Signed)
Pt discharged per written orders. All d/c orders/instructions/wound care/follow up appts/follow up care/pain management/prescriptions discussed. Pt did obtain a prescription from Dr. Rosendo Gros for [place] 10/325 (#25) and I did dispose of the prescription from Dr. Clementeen Graham for hydrocodone. Pt's wife at bedside for discharge teaching and instructions. Time allowed for questions and concerns. Pt and his wife state they have none. I transported the pt to the ED for pick up by his wife for transport home. -Roselyn Reef Yaslene Lindamood,RN

## 2014-03-25 NOTE — Progress Notes (Signed)
I have seen and examined the pt and agree with NP-Riebcok's progress note. OK for DC And g/u per SUrgery

## 2014-03-25 NOTE — Discharge Instructions (Signed)

## 2014-03-25 NOTE — Discharge Summary (Addendum)
Physician Discharge Summary  Steven Best [id number] DOB: [birth year] DOA: 03/17/2014  PCP: Shirline Frees, MD  Admit date: 03/17/2014 Discharge date: 03/25/2014  Time spent: 40 minutes  Recommendations for Outpatient Follow-up:  #1 Discharged home with home health RN #2 Followup with PCP within 1 week. Needs repeat CBC and chemistry to check for renal function. Please follow official intraoperative liver biopsy and lymph node biopsy result. #3 Followup with Dr. Paulita Fujita in 2 weeks to discuss her biopsy results. #4 nurse visit 5-7 days for staples removal and f/u with Dr. Ninfa Linden in 3 weeks for a post op check. #5 Patient will need followup CT in about 3 months to evaluate but retroperitoneal lymphadenopathy.  Discharge Diagnoses:   Principal problem Acute cholecystitis  Active Problems:   Sarcoidosis   End stage renal disease   Chronic kidney disease, stage V   Anemia   Thrombocytopenia, unspecified   Abdominal lymphadenopathy   Metabolic acidosis Hypertension   Discharge Condition: Fair  Diet recommendation: Low sodium  Filed Weights   03/22/14 2039 03/23/14 2100 03/24/14 2206  Weight: 134.038 kg (295 lb 8 oz) 134.129 kg (295 lb 11.2 oz) 136.669 kg (301 lb 4.8 oz)    History of present illness:  48 year old male with multiple medical problems-CKD 5 with maturing left upper extremity fistula, has not started dialysis, being evaluated for possible renal transplant at [facility], HTN, HLD, alcohol/tobacco abuse, 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 Denied nausea, fever or chills.  In the ED, CT scan of the abdomen showed no active complications from specific biopsy site but did show extensive upper abdominal and retroperitoneal adenopathy concerning for lymphoma. There is also mild distended gallbladder with gallbladder thickening.?  Lingular pneumonia versus atelectasis. Right adrenal adenoma. Creatinine at 7.6. Baseline seems to be between 5 and 7. Calcium 12.2. Alkaline phosphatase 143. Lipase within normal limits at 52. Platelet count was 56.  Patient admitted to hospitalist service.  Hospital Course:  Acute cholecystitis  Treated supportively with bowel rest, IV fluids, IV antibiotics (Levaquin and Flagyl) and pain management. GI, surgery and hematology consulted. laparoscopic cholecystectomy ( showed gangrenous cholecystitis) with lymph node biopsy and liver biopsy on 4/23. Received platelet transfusion.  Stable on prn Vicodin and will be discharged on it. added daily MiraLax.. -Patient tolerating advanced diet. Surgery recommends outpatient followup in 3 weeks. Home health nurse will remove staples in 5-7 days. -Has been treated with almost 9-10 days of antibiotic and does not need further antibiotics.   Granulomatous hepatitis/cirrhosis with portal hypertension:  Diagnosed by recent liver biopsy . GI-consultation appreciated. liver biopsy during laparoscopy and retroperitoneal LN bx on 4/23. Discussed the results with pathologist Dr. Saralyn Pilar today he informs that liver biopsy again shows granulomatous hepatitis and fibrosis reviewed the lymph nodes are reactive only. As per pathologist If clinical picture supports the liver biopsy finding is highly supportive of sarcoidosis. - patient possibly could have renal sarcoidosis as well. I will start him on oral prednisone 40 mg daily . Normally this dose is continued for 6-8 weeks and taper gradually over 6 months. He will followup with Dr. Paulita Fujita in GI clinic within 1 month. ( d/w Dr Penelope Coop over the phone today)  CKD stage 5/ metabolic acidosis:   Nephrology consultation and followup appreciated.  Started on bicarb drip given and in gap metabolic acidosis.  Creatinine worsening but has good urine output and no uremic signs or  symptoms.. No need for dialysis as per  renal. Patient is being evaluated for renal transplant at [facility]. He has a left upper extremity fistula which has not been started for dialysis yet. Patient will follow up with Dr. Moshe Cipro as outpatient    Hyperphosphatemia  secondary to ESRD. On phosphate binders.   Bladder outlet obstruction  On day 2 postop. Possibly due to narcotics and anesthesia. Denies any BPH like symptoms. Foley removed and is making good UOP.   Anemia:  Multifactorial secondary to ESRD, splenomegaly and chronic disease. Stable.  Thrombocytopenia:  Patient seems to have chronic thrombocytopenia but platelets dropped  from 122 on 4/15 to 56 on 4/20. Marland Kitchen.Secondary to splenomegaly and sequestration. Hematology consultation appreciated-recommend transfusing platelets. Improved to 160.    Intra-abdominal/retroperitoneal lymphadenopathy:  Reactive adenopathy as per biopsy.  Hypercalcemia:  sarcoid (seems likely) vs malignancy. Improved with fluids. Await lymph node biopsy. ACE level 98. Intact PTH 9.2. Weight is elevated calcium level and low vitamin D level. Continue calcitonin nasal spray  Hypertension:  Stable. Continue amlodipine  Tachycardia  likely due to metabolic acidosis. Stable now  Hypokalemia Replenished. Monitor as outpt  Code Status: Full  Family Communication: wife at bedside  Disposition Plan: Discharge home with home health RN  Consultants:  Gastroenterology  General surgery  Nephrology  Hematology   Procedures:  Lap cholecystectomy with intraabdominal LN bx , liver bx on 4/23    Antibiotics:  IV levofloxacin (4/20>>)  IV Flagyl (4/20>>)      Discharge Exam: Filed Vitals:   03/25/14 1410  BP: 126/80  Pulse: 86  Temp: 99.2 F (37.3 C)  Resp: 18    General: Middle aged male in NAD  HEENT: no pallor, moist mucosa  Chest: clear b/l, no added sounds  CVS:S1&S2 tachycardic , no murmurs, rubs or gallop  abd: soft, dressing over laparoscopy site intact, BS+,  nontender. Ext: warm, no edema, foley in place  CNS: AAOX3   Discharge Instructions You were cared for by a hospitalist during your hospital stay. If you have any questions about your discharge medications or the care you received while you were in the hospital after you are discharged, you can call the unit and asked to speak with the hospitalist on call if the hospitalist that took care of you is not available. Once you are discharged, your primary care physician will handle any further medical issues. Please note that NO REFILLS for any discharge medications will be authorized once you are discharged, as it is imperative that you return to your primary care physician (or establish a relationship with a primary care physician if you do not have one) for your aftercare needs so that they can reassess your need for medications and monitor your lab values.     Medication List         ALPRAZolam 1 MG tablet  Commonly known as:  XANAX  Take 1 mg by mouth 2 (two) times daily as needed. For anxiety     amLODipine 10 MG tablet  Commonly known as:  NORVASC  Take 5 mg by mouth daily.     calcitonin (salmon) 200 UNIT/ACT nasal spray  Commonly known as:  MIACALCIN/FORTICAL  Place 1 spray into alternate nostrils daily.     cyclobenzaprine 10 MG tablet  Commonly known as:  FLEXERIL  Take 10 mg by mouth 3 (three) times daily as needed. For muscle spasms.     HYDROcodone-acetaminophen 10-325 MG per tablet  Commonly known as:  NORCO  Take 2 tablets by mouth every 6 (six) hours as needed for severe pain.     pantoprazole 40 MG tablet  Commonly known as:  PROTONIX  Take 1 tablet (40 mg total) by mouth daily.     predniSONE 20 MG tablet  Commonly known as:  DELTASONE  Take 2 tablets (40 mg total) by mouth daily with breakfast.     sevelamer carbonate 800 MG tablet  Commonly known as:  RENVELA  Take 800 mg by mouth 3 (three) times daily with meals.     sodium chloride 2 % ophthalmic  solution  Commonly known as:  MURO 128  Place 2 drops into both eyes every 4 (four) hours as needed for irritation.     vitamin B-12 250 MCG tablet  Commonly known as:  CYANOCOBALAMIN  Take 250 mcg by mouth daily.        miralax 17 gm powder daily   No Known Allergies     Follow-up Information   Follow up with BLACKMAN,DOUGLAS A, MD. (1 week with the nurse to have staples removed.   3 weeks with dr. Ninfa Linden for a post operative check up)    Specialty:  General Surgery   Contact information:   [address] [place] [address] [phone]       Follow up with Shirline Frees, MD In 1 week. (please check cbc, BMET )    Specialty:  Family Medicine   Contact information:   [place] [address] [phone]       Follow up with Landry Dyke, MD. Schedule an appointment as soon as possible for a visit in 2 weeks.   Specialty:  Gastroenterology   Contact information:   [id number], [place] [address] [phone]        The results of significant diagnostics from this hospitalization (including imaging, microbiology, ancillary and laboratory) are listed below for reference.    Significant Diagnostic Studies: Ct Abdomen Pelvis Wo Contrast  03/17/2014   CLINICAL DATA:  Right side pain, short of breath and decreased appetite since transjugular liver biopsy last Wednesday.  EXAM: CT ABDOMEN AND PELVIS WITHOUT CONTRAST  TECHNIQUE: Multidetector CT imaging of the abdomen and pelvis was performed following the standard protocol without intravenous contrast.  COMPARISON:  Chest x-ray earlier today 03/17/2014; images from transjugular hepatic biopsy 03/12/2014  FINDINGS: Lower Chest: Incompletely imaged nonspecific peribronchovascular ground-glass attenuation opacity in the inferior lingula. No evidence of a pneumothorax or pleural effusion. No suspicious pulmonary mass or nodule. Visualized cardiac structures within normal  limits for size. Suggestion of atherosclerotic calcification in the coronary arteries. No pericardial effusion. Unremarkable distal thoracic esophagus. Nonspecific 1 cm paraesophageal lymph node (image 13).  Abdomen: Unremarkable CT appearance of the stomach, duodenum, and left adrenal gland. 2 cm low-attenuation (4.25 HU) right adrenal nodule consistent with a benign adenoma. Normal hepatic contour and morphology. No evidence of perihepatic hemorrhage or subcapsular hematoma. No overtly cirrhotic morphology. The gallbladder is distended and contains high attenuation material consistent with sludge and/ stones. No intra or extrahepatic biliary ductal dilatation. There is evidence of mild gallbladder wall thickening as well as a small amounts of inflammatory stranding. The spleen is enlarged. Unremarkable appearance of the pancreas.  Numerous abnormally enlarged upper abdominal lymph nodes. A 1.7 cm short axis node is identified in the gastrohepatic ligament (image 25) numerous additional nodes in the porta hepatis and hepatoduodenal ligament. The largest lies posterior to the  pancreatic head and measures 5.0 x 2.6 cm. There is interstitial stranding in the region with mild thickening of the right anterior para renal fascia. Numerous retroperitoneal lymph nodes are conspicuous in number if not by size. A left para-aortic node measures 1.3 cm in short axis. No hemo peritoneum.  No evidence of obstruction or focal bowel wall thickening. Normal appendix in the right lower quadrant. The terminal ileum is unremarkable.  Pelvis: Unremarkable bladder, prostate gland and seminal vesicles. No free fluid or suspicious adenopathy.  Bones/Soft Tissues: No acute fracture or aggressive appearing lytic or blastic osseous lesion.  Vascular: Limited evaluation in the absence of intravenous contrast. Atherosclerotic vascular disease without significant stenosis or aneurysmal dilatation.  IMPRESSION: 1. No evidence of acute  complication related to the patient's biopsy procedure last week. Specifically, no pneumothorax, pleural effusion, perihepatic hemorrhage or subcapsular hematoma. 2. Extensive upper abdominal and retroperitoneal adenopathy highly concerning for lymphoma. Metastatic adenopathy is considered less likely. The largest node measures 5 x 2.6 cm just posterior to the pancreatic head. There is associated interstitial stranding in the adjacent fat which is favored to reflect lymphatic congestion. Recommend further evaluation with PET-CT. 3. Mildly distended gallbladder containing sludge and/or small stones. There is mild thickening of the gallbladder wall. Acute or chronic cholecystitis is not excluded. Given the patient's right upper quadrant pain, consider further evaluation with right upper quadrant ultrasound to assess for cholecystitis. 4. Nonspecific and incompletely imaged ground-glass attenuation opacity in the inferior lingula. Differential considerations include atelectasis and bronchopneumonia. 5. Atherosclerotic vascular calcifications. Suspect coronary artery calcifications in the incompletely imaged heart. 6. Splenomegaly. This may be related to intrinsic liver disease and portal hypertension, or lymphoma. 7. Right adrenal adenoma.   Electronically Signed   By: Jacqulynn Cadet M.D.   On: 03/17/2014 18:02   Dg Chest 2 View  03/17/2014   CLINICAL DATA:  Shortness of breath  EXAM: CHEST  2 VIEW  COMPARISON:  None.  FINDINGS: The cardiomediastinal silhouette is unremarkable.  There is no evidence of focal airspace disease, pulmonary edema, suspicious pulmonary nodule/mass, pleural effusion, or pneumothorax. No acute bony abnormalities are identified.  IMPRESSION: No active cardiopulmonary disease.   Electronically Signed   By: Hassan Rowan M.D.   On: 03/17/2014 15:30   Ir Venogram Hepatic W Hemodynamic Evaluation  03/12/2014   CLINICAL DATA:  48 year old male with hepatic cirrhosis and chronic renal  insufficiency. He presents for transjugular hepatic venogram and pressure measurements with transjugular liver biopsy to further assess new the degree of cirrhosis and presence or absence of portal vein hypertension.  EXAM: TRANSJUGULAR LIVER BIOPSY; VENO HEPATIC W/ HEMODYNAMICS; IR ULTRASOUND GUIDANCE VASC ACCESS RIGHT  Date: 03/12/2014  PROCEDURE: 1. Ultrasound-guided puncture of the right internal jugular vein 2. Catheterization of the right hepatic vein 3. Limited right hepatic venogram 4. Free and balloon occluded hepatic venous pressure measurements 5. Transjugular hepatic biopsy Interventional Radiologist:  Criselda Peaches, MD  ANESTHESIA/SEDATION: Moderate (conscious) sedation was used. Four mg Versed, 250 mcg Fentanyl were administered intravenously. The patient's vital signs were monitored continuously by radiology nursing throughout the procedure.  Sedation Time: 35 minutes  FLUOROSCOPY TIME:  5 min 42 seconds  CONTRAST:  10 cc of CO2 gas administered into the hepatic veins  TECHNIQUE: Informed consent was obtained from the patient following explanation of the procedure, risks, benefits and alternatives. The patient understands, agrees and consents for the procedure. All questions were addressed. A time out was performed.  Maximal barrier sterile technique utilized including  caps, mask, sterile gowns, sterile gloves, large sterile drape, hand hygiene, and Betadine skin prep.  The right neck was interrogated with ultrasound. The internal jugular vein was found to be widely patent. Local anesthesia was attained by infiltration with 1% lidocaine. A small dermatotomy was made. Under real-time sonographic guidance, the right internal jugular vein was punctured with a 21 gauge micropuncture needle. An image was obtained and stored for the medical record. With the aid of a transitional 5 French micro catheter, the micro wire was exchanged for a Bentson wire which was subsequently advanced into the inferior  vena cava. A TIPS sheath was advanced over the wire and positioned within the inferior right atrium.  Using an angled catheter and Bentson wire the right hepatic vein was selected. Anatomic location within the hepatic vein was confirmed both by limited hepatic CO2 venography and a steep RAO projection confirming posterior location of the vein relative to the spine.  Free and balloon occluded hepatic venous pressure min were then obtained 3 times at 1 min intervals. The free hepatic venous pressure was measured at 20, 21 and 20 mm of mercury. The balloon occluded hepatic venous pressure was measured at 31 mm of mercury all 3 times. This yields a hepatic venous pressure gradient of 10-11 mm of mercury.  The pressure device was then connected to the sheath. The suprahepatic IVC pressure is 17 mm of mercury which is not significantly different from the free hepatic venous pressure. The right atrial pressure is 20/13 (mean 16).  The sheath was readvanced into the right hepatic vein. The biopsy device was advanced. Three core biopsies were obtained, placed in formalin and sent to pathology for further evaluation. The sheath was removed. Hemostasis was attained with manual pressure. There was no immediate complication. The patient tolerated the procedure well.  IMPRESSION: 1. Successful transjugular liver biopsy. 2. Hepatic venous pressure measurements demonstrate a hepatic venous pressure gradient (estimated portosystemic gradient) of 10-11 mmHg consistent with portal hypertension. 3. Elevated right atrial pressure of 20/13 (mean 16 mmHg). The typical mean right atrial pressure is 5-10 mm Hg. Signed,  Criselda Peaches, MD  [facility]  [facility]   Electronically Signed   By: Jacqulynn Cadet M.D.   On: 03/12/2014 18:16   Ir Transcatheter Bx  03/12/2014   CLINICAL DATA:  48 year old male with hepatic cirrhosis and chronic renal insufficiency. He presents for  transjugular hepatic venogram and pressure measurements with transjugular liver biopsy to further assess new the degree of cirrhosis and presence or absence of portal vein hypertension.  EXAM: TRANSJUGULAR LIVER BIOPSY; VENO HEPATIC W/ HEMODYNAMICS; IR ULTRASOUND GUIDANCE VASC ACCESS RIGHT  Date: 03/12/2014  PROCEDURE: 1. Ultrasound-guided puncture of the right internal jugular vein 2. Catheterization of the right hepatic vein 3. Limited right hepatic venogram 4. Free and balloon occluded hepatic venous pressure measurements 5. Transjugular hepatic biopsy Interventional Radiologist:  Criselda Peaches, MD  ANESTHESIA/SEDATION: Moderate (conscious) sedation was used. Four mg Versed, 250 mcg Fentanyl were administered intravenously. The patient's vital signs were monitored continuously by radiology nursing throughout the procedure.  Sedation Time: 35 minutes  FLUOROSCOPY TIME:  5 min 42 seconds  CONTRAST:  10 cc of CO2 gas administered into the hepatic veins  TECHNIQUE: Informed consent was obtained from the patient following explanation of the procedure, risks, benefits and alternatives. The patient understands, agrees and consents for the procedure. All questions were addressed. A time out was performed.  Maximal barrier sterile technique utilized including  caps, mask, sterile gowns, sterile gloves, large sterile drape, hand hygiene, and Betadine skin prep.  The right neck was interrogated with ultrasound. The internal jugular vein was found to be widely patent. Local anesthesia was attained by infiltration with 1% lidocaine. A small dermatotomy was made. Under real-time sonographic guidance, the right internal jugular vein was punctured with a 21 gauge micropuncture needle. An image was obtained and stored for the medical record. With the aid of a transitional 5 French micro catheter, the micro wire was exchanged for a Bentson wire which was subsequently advanced into the inferior vena cava. A TIPS sheath was  advanced over the wire and positioned within the inferior right atrium.  Using an angled catheter and Bentson wire the right hepatic vein was selected. Anatomic location within the hepatic vein was confirmed both by limited hepatic CO2 venography and a steep RAO projection confirming posterior location of the vein relative to the spine.  Free and balloon occluded hepatic venous pressure min were then obtained 3 times at 1 min intervals. The free hepatic venous pressure was measured at 20, 21 and 20 mm of mercury. The balloon occluded hepatic venous pressure was measured at 31 mm of mercury all 3 times. This yields a hepatic venous pressure gradient of 10-11 mm of mercury.  The pressure device was then connected to the sheath. The suprahepatic IVC pressure is 17 mm of mercury which is not significantly different from the free hepatic venous pressure. The right atrial pressure is 20/13 (mean 16).  The sheath was readvanced into the right hepatic vein. The biopsy device was advanced. Three core biopsies were obtained, placed in formalin and sent to pathology for further evaluation. The sheath was removed. Hemostasis was attained with manual pressure. There was no immediate complication. The patient tolerated the procedure well.  IMPRESSION: 1. Successful transjugular liver biopsy. 2. Hepatic venous pressure measurements demonstrate a hepatic venous pressure gradient (estimated portosystemic gradient) of 10-11 mmHg consistent with portal hypertension. 3. Elevated right atrial pressure of 20/13 (mean 16 mmHg). The typical mean right atrial pressure is 5-10 mm Hg. Signed,  Criselda Peaches, MD  [facility]  [facility]   Electronically Signed   By: Jacqulynn Cadet M.D.   On: 03/12/2014 18:16   US Renal  03/23/2014   CLINICAL DATA:  Bladder outlet obstruction; chronic renal disease.  EXAM: RENAL/URINARY TRACT ULTRASOUND COMPLETE  COMPARISON:  Right upper quadrant  abdominal ultrasound, and CT of the abdomen and pelvis, performed 03/17/2014  FINDINGS: Right Kidney:  Length: 12.7 cm. Diffusely increased parenchymal echogenicity, likely reflecting medical renal disease. No mass or hydronephrosis visualized.  Left Kidney:  Length: 12.1 cm. Difficult to fully assess. Increased parenchymal echogenicity likely reflects medical renal disease. No mass or hydronephrosis visualized.  Bladder:  Not visualized; the patient has a Foley catheter. Evaluation of the pelvis is limited due to overlying bowel gas.  Trace ascites is incidentally noted about the liver.  IMPRESSION: 1. No evidence of hydronephrosis. 2. Diffusely increased renal parenchymal echogenicity likely reflects medical renal disease. 3. Trace ascites incidentally noted about the liver.   Electronically Signed   By: Garald Balding M.D.   On: 03/23/2014 00:40   US Abdomen Limited  03/18/2014   CLINICAL DATA:  Abdominal pain  EXAM: US ABDOMEN LIMITED - RIGHT UPPER QUADRANT  COMPARISON:  CT ABD/PELV WO CM dated 03/17/2014; SP TRANSCATHETER BX dated 03/12/2014  FINDINGS: Gallbladder:  The gallbladder contains heterogeneous sludge. There is wall thickening.  Positive sonographic Murphy sign. There is also pericholecystic fluid. No definite stones are identified.  Common bile duct:  Diameter: 8 mm in caliber.  Liver:  No focal lesion identified. Within normal limits in parenchymal echogenicity.  IMPRESSION: Findings above are worrisome for a calculus acute cholecystitis. Correlate clinically as for the need for nuclear medicine imaging.  Common bile duct is dilated at 8 mm. Biliary obstruction is not excluded.   Electronically Signed   By: Maryclare Bean M.D.   On: 03/18/2014 00:06   Ir US Guide Vasc Access Right  03/12/2014   CLINICAL DATA:  48 year old male with hepatic cirrhosis and chronic renal insufficiency. He presents for transjugular hepatic venogram and pressure measurements with transjugular liver biopsy to further assess  new the degree of cirrhosis and presence or absence of portal vein hypertension.  EXAM: TRANSJUGULAR LIVER BIOPSY; VENO HEPATIC W/ HEMODYNAMICS; IR ULTRASOUND GUIDANCE VASC ACCESS RIGHT  Date: 03/12/2014  PROCEDURE: 1. Ultrasound-guided puncture of the right internal jugular vein 2. Catheterization of the right hepatic vein 3. Limited right hepatic venogram 4. Free and balloon occluded hepatic venous pressure measurements 5. Transjugular hepatic biopsy Interventional Radiologist:  Criselda Peaches, MD  ANESTHESIA/SEDATION: Moderate (conscious) sedation was used. Four mg Versed, 250 mcg Fentanyl were administered intravenously. The patient's vital signs were monitored continuously by radiology nursing throughout the procedure.  Sedation Time: 35 minutes  FLUOROSCOPY TIME:  5 min 42 seconds  CONTRAST:  10 cc of CO2 gas administered into the hepatic veins  TECHNIQUE: Informed consent was obtained from the patient following explanation of the procedure, risks, benefits and alternatives. The patient understands, agrees and consents for the procedure. All questions were addressed. A time out was performed.  Maximal barrier sterile technique utilized including caps, mask, sterile gowns, sterile gloves, large sterile drape, hand hygiene, and Betadine skin prep.  The right neck was interrogated with ultrasound. The internal jugular vein was found to be widely patent. Local anesthesia was attained by infiltration with 1% lidocaine. A small dermatotomy was made. Under real-time sonographic guidance, the right internal jugular vein was punctured with a 21 gauge micropuncture needle. An image was obtained and stored for the medical record. With the aid of a transitional 5 French micro catheter, the micro wire was exchanged for a Bentson wire which was subsequently advanced into the inferior vena cava. A TIPS sheath was advanced over the wire and positioned within the inferior right atrium.  Using an angled catheter and Bentson  wire the right hepatic vein was selected. Anatomic location within the hepatic vein was confirmed both by limited hepatic CO2 venography and a steep RAO projection confirming posterior location of the vein relative to the spine.  Free and balloon occluded hepatic venous pressure min were then obtained 3 times at 1 min intervals. The free hepatic venous pressure was measured at 20, 21 and 20 mm of mercury. The balloon occluded hepatic venous pressure was measured at 31 mm of mercury all 3 times. This yields a hepatic venous pressure gradient of 10-11 mm of mercury.  The pressure device was then connected to the sheath. The suprahepatic IVC pressure is 17 mm of mercury which is not significantly different from the free hepatic venous pressure. The right atrial pressure is 20/13 (mean 16).  The sheath was readvanced into the right hepatic vein. The biopsy device was advanced. Three core biopsies were obtained, placed in formalin and sent to pathology for further evaluation. The sheath was removed. Hemostasis was attained with manual  pressure. There was no immediate complication. The patient tolerated the procedure well.  IMPRESSION: 1. Successful transjugular liver biopsy. 2. Hepatic venous pressure measurements demonstrate a hepatic venous pressure gradient (estimated portosystemic gradient) of 10-11 mmHg consistent with portal hypertension. 3. Elevated right atrial pressure of 20/13 (mean 16 mmHg). The typical mean right atrial pressure is 5-10 mm Hg. Signed,  Criselda Peaches, MD  [facility]  [facility]   Electronically Signed   By: Jacqulynn Cadet M.D.   On: 03/12/2014 18:16    Microbiology: Recent Results (from the past 240 hour(s))  SURGICAL PCR SCREEN     Status: Abnormal   Collection Time    03/20/14  9:35 AM      Result Value Ref Range Status   MRSA, PCR NEGATIVE  NEGATIVE Final   Staphylococcus aureus POSITIVE (*) NEGATIVE Final   Comment:             The Xpert SA Assay (FDA     approved for NASAL specimens     in patients over 39 years of age),     is one component of     a comprehensive surveillance     program.  Test performance has     been validated by [employer] for patients greater     than or equal to 20 year old.     It is not intended     to diagnose infection nor to     guide or monitor treatment.     Labs: Basic Metabolic Panel:  Recent Labs Lab 03/19/14 0711  03/21/14 0440 03/22/14 1030 03/23/14 0545 03/24/14 0603 03/25/14 0600  NA 140  < > 141 141 141 137 134*  K 4.8  < > 4.6 4.1 3.5* 3.5* 3.3*  CL 103  < > 104 99 96 93* 88*  CO2 17*  < > 17* 19 23 25 25   GLUCOSE 97  < > 142* 113* 108* 105* 121*  BUN 76*  < > 73* 79* 82* 83* 82*  CREATININE 7.85*  < > 7.44* 8.27* 8.70* 9.03* 9.26*  CALCIUM 11.0*  < > 9.8 9.9 9.5 9.6 9.2  MG 2.5  --   --   --   --   --   --   PHOS 6.7*  --  8.1*  --   --   --   --   < > = values in this interval not displayed. Liver Function Tests:  Recent Labs Lab 03/21/14 0440 03/22/14 1030 03/23/14 0545 03/24/14 0603 03/25/14 0600  AST 40* 30 20 15 15   ALT 31 25 19 15 10   ALKPHOS 133* 109 91 88 88  BILITOT 0.5 0.6 0.5 0.4 0.5  PROT 7.5 7.5 6.7 6.7 6.6  ALBUMIN 3.4* 3.3* 3.0* 2.9* 2.9*   No results found for this basename: LIPASE, AMYLASE,  in the last 168 hours No results found for this basename: AMMONIA,  in the last 168 hours CBC:  Recent Labs Lab 03/21/14 0440 03/22/14 1030 03/23/14 0545 03/24/14 0603 03/25/14 0600  WBC 10.9* 14.1* 10.7* 8.0 6.4  NEUTROABS 9.5* 12.3* 8.8* 6.4 5.0  HGB 9.6* 9.1* 8.2* 8.1* 8.2*  HCT 28.1* 27.1* 23.9* 23.5* 24.5*  MCV 94.9 96.1 94.8 94.0 95.3  PLT 118* 160 156 196 195   Cardiac Enzymes: No results found for this basename: CKTOTAL, CKMB, CKMBINDEX, TROPONINI,  in the last 168 hours BNP: BNP (last 3 results) No results found  for this basename: PROBNP,  in the last 8760 hours CBG: No results found for this basename:  GLUCAP,  in the last 168 hours     Signed:  Deriona Altemose  [facility] 03/25/2014, 3:40 PM

## 2014-03-25 NOTE — Progress Notes (Signed)
Patient ID: Steven Best, male   DOB: 06/05/1966, 48 y.o.   MRN: [id number]   Subjective: Had a BM last night. No n/v.  Tolerating a diet.    Objective:  Vital signs:  Filed Vitals:   03/24/14 2037 03/24/14 2206 03/25/14 0415 03/25/14 0730  BP: 147/81  122/70 130/79  Pulse: 99  100 89  Temp: 98.3 F (36.8 C)  98.9 F (37.2 C) 99 F (37.2 C)  TempSrc: Oral  Oral Oral  Resp: '18  18 18  ' Height:      Weight:  301 lb 4.8 oz (136.669 kg)    SpO2: 100%  94% 98%    Last BM Date: 03/23/14  Intake/Output   Yesterday:  04/27 0701 - 04/28 0700 In: 6659.9 [P.O.:720; I.V.:2748.8; IV Piggyback:400] Out: 2700 [Urine:2700] This shift:  Total I/O In: 240 [P.O.:240] Out: 200 [Urine:200]  Physical Exam:  General: Pt awake/alert/oriented x4 in no acute distress  Abdomen: Soft. Nondistended. +bs. Incisions are c/d/i. No evidence of peritonitis. No incarcerated hernias.    Problem List:   Active Problems:   Lymphoma   Cholecystitis, acute   Chronic kidney disease, stage V   Anemia   Thrombocytopenia, unspecified   Abdominal lymphadenopathy   Metabolic acidosis    Results:   Labs: Results for orders placed during the hospital encounter of 03/17/14 (from the past 48 hour(s))  COMPREHENSIVE METABOLIC PANEL     Status: Abnormal   Collection Time    03/24/14  6:03 AM      Result Value Ref Range   Sodium 137  137 - 147 mEq/L   Potassium 3.5 (*) 3.7 - 5.3 mEq/L   Chloride 93 (*) 96 - 112 mEq/L   CO2 25  19 - 32 mEq/L   Glucose, Bld 105 (*) 70 - 99 mg/dL   BUN 83 (*) 6 - 23 mg/dL   Creatinine, Ser 9.03 (*) 0.50 - 1.35 mg/dL   Calcium 9.6  8.4 - 10.5 mg/dL   Total Protein 6.7  6.0 - 8.3 g/dL   Albumin 2.9 (*) 3.5 - 5.2 g/dL   AST 15  0 - 37 U/L   ALT 15  0 - 53 U/L   Alkaline Phosphatase 88  39 - 117 U/L   Total Bilirubin 0.4  0.3 - 1.2 mg/dL   GFR calc non Af Amer 6 (*) >90 mL/min   GFR calc Af Amer 7 (*) >90 mL/min   Comment: (NOTE)     The eGFR has been  calculated using the CKD EPI equation.     This calculation has not been validated in all clinical situations.     eGFR's persistently <90 mL/min signify possible Chronic Kidney     Disease.  CBC WITH DIFFERENTIAL     Status: Abnormal   Collection Time    03/24/14  6:03 AM      Result Value Ref Range   WBC 8.0  4.0 - 10.5 K/uL   RBC 2.50 (*) 4.22 - 5.81 MIL/uL   Hemoglobin 8.1 (*) 13.0 - 17.0 g/dL   HCT 23.5 (*) 39.0 - 52.0 %   MCV 94.0  78.0 - 100.0 fL   MCH 32.4  26.0 - 34.0 pg   MCHC 34.5  30.0 - 36.0 g/dL   RDW 13.1  11.5 - 15.5 %   Platelets 196  150 - 400 K/uL   Neutrophils Relative % 80 (*) 43 - 77 %   Neutro Abs 6.4  1.7 - 7.7  K/uL   Lymphocytes Relative 8 (*) 12 - 46 %   Lymphs Abs 0.6 (*) 0.7 - 4.0 K/uL   Monocytes Relative 9  3 - 12 %   Monocytes Absolute 0.7  0.1 - 1.0 K/uL   Eosinophils Relative 3  0 - 5 %   Eosinophils Absolute 0.2  0.0 - 0.7 K/uL   Basophils Relative 0  0 - 1 %   Basophils Absolute 0.0  0.0 - 0.1 K/uL  COMPREHENSIVE METABOLIC PANEL     Status: Abnormal   Collection Time    03/25/14  6:00 AM      Result Value Ref Range   Sodium 134 (*) 137 - 147 mEq/L   Potassium 3.3 (*) 3.7 - 5.3 mEq/L   Chloride 88 (*) 96 - 112 mEq/L   CO2 25  19 - 32 mEq/L   Glucose, Bld 121 (*) 70 - 99 mg/dL   BUN 82 (*) 6 - 23 mg/dL   Creatinine, Ser 9.26 (*) 0.50 - 1.35 mg/dL   Calcium 9.2  8.4 - 10.5 mg/dL   Total Protein 6.6  6.0 - 8.3 g/dL   Albumin 2.9 (*) 3.5 - 5.2 g/dL   AST 15  0 - 37 U/L   ALT 10  0 - 53 U/L   Alkaline Phosphatase 88  39 - 117 U/L   Total Bilirubin 0.5  0.3 - 1.2 mg/dL   GFR calc non Af Amer 6 (*) >90 mL/min   GFR calc Af Amer 7 (*) >90 mL/min   Comment: (NOTE)     The eGFR has been calculated using the CKD EPI equation.     This calculation has not been validated in all clinical situations.     eGFR's persistently <90 mL/min signify possible Chronic Kidney     Disease.  CBC WITH DIFFERENTIAL     Status: Abnormal   Collection Time     03/25/14  6:00 AM      Result Value Ref Range   WBC 6.4  4.0 - 10.5 K/uL   RBC 2.57 (*) 4.22 - 5.81 MIL/uL   Hemoglobin 8.2 (*) 13.0 - 17.0 g/dL   HCT 24.5 (*) 39.0 - 52.0 %   MCV 95.3  78.0 - 100.0 fL   MCH 31.9  26.0 - 34.0 pg   MCHC 33.5  30.0 - 36.0 g/dL   RDW 12.8  11.5 - 15.5 %   Platelets 195  150 - 400 K/uL   Neutrophils Relative % 79 (*) 43 - 77 %   Neutro Abs 5.0  1.7 - 7.7 K/uL   Lymphocytes Relative 10 (*) 12 - 46 %   Lymphs Abs 0.6 (*) 0.7 - 4.0 K/uL   Monocytes Relative 8  3 - 12 %   Monocytes Absolute 0.5  0.1 - 1.0 K/uL   Eosinophils Relative 3  0 - 5 %   Eosinophils Absolute 0.2  0.0 - 0.7 K/uL   Basophils Relative 0  0 - 1 %   Basophils Absolute 0.0  0.0 - 0.1 K/uL    Imaging / Studies: No results found.  Medications / Allergies: per chart  Antibiotics: Anti-infectives   Start     Dose/Rate Route Frequency Ordered Stop   03/19/14 2200  levofloxacin (LEVAQUIN) IVPB 500 mg     500 mg 100 mL/hr over 60 Minutes Intravenous Every 48 hours 03/17/14 1931     03/17/14 2115  levofloxacin (LEVAQUIN) IVPB 750 mg     750 mg  100 mL/hr over 90 Minutes Intravenous  Once 03/17/14 2112 03/18/14 0135   03/17/14 1945  metroNIDAZOLE (FLAGYL) IVPB 500 mg     500 mg 100 mL/hr over 60 Minutes Intravenous Every 8 hours 03/17/14 1931        Assessment/Plan  RUQ abdominal pain possible acute cholecystitis  granulomatous hepatitis/cirrhosis  Acute on CKD stage V  Upper abdominal and retroperitoneal lymphadenopathy  Splenomegaly  Hypercalcemia  Thrombocytopenia  Anemia   S/p laparoscopic cholecystectomy converted to open intra-abdominal lymph node biopsy and liver biopsy---Dr. Ninfa Linden  POD#5  -pathology result will be up this AM per pathologist.  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  Intra-abdominal lymph node biopsy is benign.   -he will need a follow up CT in approximately 3 months to evaluate the retroperitoneal  lymphadenopathy -c/w current pain regimen for approximately 2-3 weeks, then may resume hydrocodone/apap -nurse visit 5-7 days for staples removal and f/u with Dr. Ninfa Linden in 3 weeks for a post op check -c/w miralax  -ambulate, up to chair with meals, IS  -he is is surgically stable for discharge.  Further management per primary team and nephrology.   Steven Best, [facility] Pager [phone] Office [phone]  03/25/2014 9:07 AM

## 2014-03-27 LAB — PTH-RELATED PEPTIDE: PTH-related peptide: 39 pg/mL — ABNORMAL HIGH (ref 14–27)

## 2014-03-31 ENCOUNTER — Encounter (INDEPENDENT_AMBULATORY_CARE_PROVIDER_SITE_OTHER): Payer: Self-pay

## 2014-03-31 ENCOUNTER — Ambulatory Visit (INDEPENDENT_AMBULATORY_CARE_PROVIDER_SITE_OTHER): Payer: BC Managed Care – PPO

## 2014-03-31 ENCOUNTER — Telehealth (INDEPENDENT_AMBULATORY_CARE_PROVIDER_SITE_OTHER): Payer: Self-pay | Admitting: General Surgery

## 2014-03-31 ENCOUNTER — Telehealth (INDEPENDENT_AMBULATORY_CARE_PROVIDER_SITE_OTHER): Payer: Self-pay

## 2014-03-31 DIAGNOSIS — Z9049 Acquired absence of other specified parts of digestive tract: Secondary | ICD-10-CM

## 2014-03-31 DIAGNOSIS — Z9889 Other specified postprocedural states: Secondary | ICD-10-CM

## 2014-03-31 NOTE — Telephone Encounter (Signed)
Pt s/p lap chole on 03/20/14. Pt is requesting a refill on pain meds. Pt rates his pain 7/10. Per protocol for first refill pt written Norco 5/325mg  1 tab every 4 hrs as needed for pain.

## 2014-03-31 NOTE — Telephone Encounter (Signed)
Called patient to let him know that he has an Rx for Oxycodone 5 mg #40 okay per Dr Ninfa Linden and had Dr Harlow Asa write the Rx

## 2014-03-31 NOTE — Progress Notes (Signed)
Writing this note for [facility] for seeing the pt as a nurse visit to have staples removed. The pt tolerated the staple removal fine. The area is completely closed so no steri strips were applied to the area. The pt went home feeling fine. Kenney Houseman, RN was in the room shadowing along with Anderson Malta.

## 2014-04-15 ENCOUNTER — Encounter (INDEPENDENT_AMBULATORY_CARE_PROVIDER_SITE_OTHER): Payer: Self-pay | Admitting: Surgery

## 2014-04-15 ENCOUNTER — Ambulatory Visit (INDEPENDENT_AMBULATORY_CARE_PROVIDER_SITE_OTHER): Payer: BC Managed Care – PPO | Admitting: Surgery

## 2014-04-15 VITALS — BP 128/79 | HR 76 | Temp 98.6°F | Resp 16 | Ht 76.0 in | Wt 316.2 lb

## 2014-04-15 DIAGNOSIS — Z09 Encounter for follow-up examination after completed treatment for conditions other than malignant neoplasm: Secondary | ICD-10-CM

## 2014-04-15 NOTE — Progress Notes (Signed)
Subjective:     Patient ID: Steven Best, male   DOB: [birth year], 48 y.o.   MRN: [id number]  HPI He is here for his first post op visit s/p lap chole, liver biopsy, and laparotomy for lymph node biopsy.  He is actually doing fairly well.  Review of Systems     Objective:   Physical Exam His abdomen is soft and his incisions are healing well.  There is no evidence of hernia    Assessment:     Stable postp     Plan:     He is slowly improving.  He will continue followup with his primary care physician and gastroenterologist.  He may need a follow up CT scan of the abdomen in 3 months to follow up the adenopathy. He may return to work on June 8th to full activity. I will see him here PRN

## 2014-04-18 ENCOUNTER — Other Ambulatory Visit: Payer: Self-pay | Admitting: Internal Medicine

## 2014-04-21 ENCOUNTER — Other Ambulatory Visit (HOSPITAL_COMMUNITY): Payer: Self-pay | Admitting: Internal Medicine

## 2014-05-14 ENCOUNTER — Other Ambulatory Visit (HOSPITAL_COMMUNITY): Payer: Self-pay | Admitting: *Deleted

## 2014-05-15 ENCOUNTER — Ambulatory Visit (HOSPITAL_COMMUNITY)
Admission: RE | Admit: 2014-05-15 | Discharge: 2014-05-15 | Disposition: A | Discharge status: Home / Self Care | Payer: BC Managed Care – PPO | Source: Ambulatory Visit | Attending: Nephrology | Admitting: Nephrology

## 2014-05-15 DIAGNOSIS — D649 Anemia, unspecified: Secondary | ICD-10-CM | POA: Insufficient documentation

## 2014-05-15 LAB — POCT HEMOGLOBIN-HEMACUE: HEMOGLOBIN: 8.4 g/dL — AB (ref 13.0–17.0)

## 2014-05-15 MED ORDER — SODIUM CHLORIDE 0.9 % IV SOLN
1020.0000 mg | Freq: Once | INTRAVENOUS | Status: AC
  Administered 2014-05-15: 1020 mg via INTRAVENOUS
  Filled 2014-05-15: qty 34

## 2014-05-15 MED ORDER — EPOETIN ALFA 20000 UNIT/ML IJ SOLN
INTRAMUSCULAR | Status: AC
  Administered 2014-05-15: 20000 [IU] via SUBCUTANEOUS
  Filled 2014-05-15: qty 1

## 2014-05-15 MED ORDER — EPOETIN ALFA 20000 UNIT/ML IJ SOLN: 20000.0000 [IU] | INTRAMUSCULAR | Status: DC

## 2014-05-15 NOTE — Discharge Instructions (Signed)
Ferumoxytol injection What is this medicine? FERUMOXYTOL is an iron complex. Iron is used to make healthy red blood cells, which carry oxygen and nutrients throughout the body. This medicine is used to treat iron deficiency anemia in people with chronic kidney disease. This medicine may be used for other purposes; ask your health care provider or pharmacist if you have questions. COMMON BRAND NAME(S): Feraheme What should I tell my health care provider before I take this medicine? They need to know if you have any of these conditions: -anemia not caused by low iron levels -high levels of iron in the blood -magnetic resonance imaging (MRI) test scheduled -an unusual or allergic reaction to iron, other medicines, foods, dyes, or preservatives -pregnant or trying to get pregnant -breast-feeding How should I use this medicine? This medicine is for injection into a vein. It is given by a health care professional in a hospital or clinic setting. Talk to your pediatrician regarding the use of this medicine in children. Special care may be needed. Overdosage: If you think you've taken too much of this medicine contact a poison control center or emergency room at once. Overdosage: If you think you have taken too much of this medicine contact a poison control center or emergency room at once. NOTE: This medicine is only for you. Do not share this medicine with others. What if I miss a dose? It is important not to miss your dose. Call your doctor or health care professional if you are unable to keep an appointment. What may interact with this medicine? This medicine may interact with the following medications: -other iron products This list may not describe all possible interactions. Give your health care provider a list of all the medicines, herbs, non-prescription drugs, or dietary supplements you use. Also tell them if you smoke, drink alcohol, or use illegal drugs. Some items may interact with your  medicine. What should I watch for while using this medicine? Visit your doctor or healthcare professional regularly. Tell your doctor or healthcare professional if your symptoms do not start to get better or if they get worse. You may need blood work done while you are taking this medicine. You may need to follow a special diet. Talk to your doctor. Foods that contain iron include: whole grains/cereals, dried fruits, beans, or peas, leafy green vegetables, and organ meats (liver, kidney). What side effects may I notice from receiving this medicine? Side effects that you should report to your doctor or health care professional as soon as possible: -allergic reactions like skin rash, itching or hives, swelling of the face, lips, or tongue -breathing problems -changes in blood pressure -feeling faint or lightheaded, falls -fever or chills -flushing, sweating, or hot feelings -swelling of the ankles or feet Side effects that usually do not require medical attention (Report these to your doctor or health care professional if they continue or are bothersome.): -diarrhea -headache -nausea, vomiting -stomach pain This list may not describe all possible side effects. Call your doctor for medical advice about side effects. You may report side effects to FDA at 1-800-FDA-1088. Where should I keep my medicine? This drug is given in a hospital or clinic and will not be stored at home. NOTE: This sheet is a summary. It may not cover all possible information. If you have questions about this medicine, talk to your doctor, pharmacist, or health care provider.  2015, Elsevier/Gold Standard. (2012-06-29 15:23:36) Epoetin Alfa injection What is this medicine? EPOETIN ALFA (e POE e tin AL  fa) helps your body make more red blood cells. This medicine is used to treat anemia caused by chronic kidney failure, cancer chemotherapy, or HIV-therapy. It may also be used before surgery if you have anemia. This medicine  may be used for other purposes; ask your health care provider or pharmacist if you have questions. COMMON BRAND NAME(S): Epogen, Procrit What should I tell my health care provider before I take this medicine? They need to know if you have any of these conditions: -blood clotting disorders -cancer patient not on chemotherapy -cystic fibrosis -heart disease, such as angina or heart failure -hemoglobin level of 12 g/dL or greater -high blood pressure -low levels of folate, iron, or vitamin B12 -seizures -an unusual or allergic reaction to erythropoietin, albumin, benzyl alcohol, hamster proteins, other medicines, foods, dyes, or preservatives -pregnant or trying to get pregnant -breast-feeding How should I use this medicine? This medicine is for injection into a vein or under the skin. It is usually given by a health care professional in a hospital or clinic setting. If you get this medicine at home, you will be taught how to prepare and give this medicine. Use exactly as directed. Take your medicine at regular intervals. Do not take your medicine more often than directed. It is important that you put your used needles and syringes in a special sharps container. Do not put them in a trash can. If you do not have a sharps container, call your pharmacist or healthcare provider to get one. Talk to your pediatrician regarding the use of this medicine in children. While this drug may be prescribed for selected conditions, precautions do apply. Overdosage: If you think you have taken too much of this medicine contact a poison control center or emergency room at once. NOTE: This medicine is only for you. Do not share this medicine with others. What if I miss a dose? If you miss a dose, take it as soon as you can. If it is almost time for your next dose, take only that dose. Do not take double or extra doses. What may interact with this medicine? Do not take this medicine with any of the following  medications: -darbepoetin alfa This list may not describe all possible interactions. Give your health care provider a list of all the medicines, herbs, non-prescription drugs, or dietary supplements you use. Also tell them if you smoke, drink alcohol, or use illegal drugs. Some items may interact with your medicine. What should I watch for while using this medicine? Visit your prescriber or health care professional for regular checks on your progress and for the needed blood tests and blood pressure measurements. It is especially important for the doctor to make sure your hemoglobin level is in the desired range, to limit the risk of potential side effects and to give you the best benefit. Keep all appointments for any recommended tests. Check your blood pressure as directed. Ask your doctor what your blood pressure should be and when you should contact him or her. As your body makes more red blood cells, you may need to take iron, folic acid, or vitamin B supplements. Ask your doctor or health care provider which products are right for you. If you have kidney disease continue dietary restrictions, even though this medication can make you feel better. Talk with your doctor or health care professional about the foods you eat and the vitamins that you take. What side effects may I notice from receiving this medicine? Side effects that you  should report to your doctor or health care professional as soon as possible: -allergic reactions like skin rash, itching or hives, swelling of the face, lips, or tongue -breathing problems -changes in vision -chest pain -confusion, trouble speaking or understanding -feeling faint or lightheaded, falls -high blood pressure -muscle aches or pains -pain, swelling, warmth in the leg -rapid weight gain -severe headaches -sudden numbness or weakness of the face, arm or leg -trouble walking, dizziness, loss of balance or coordination -seizures (convulsions) -swelling  of the ankles, feet, hands -unusually weak or tired Side effects that usually do not require medical attention (report to your doctor or health care professional if they continue or are bothersome): -diarrhea -fever, chills (flu-like symptoms) -headaches -nausea, vomiting -redness, stinging, or swelling at site where injected This list may not describe all possible side effects. Call your doctor for medical advice about side effects. You may report side effects to FDA at 1-800-FDA-1088. Where should I keep my medicine? Keep out of the reach of children. Store in a refrigerator between 2 and 8 degrees C (36 and 46 degrees F). Do not freeze or shake. Throw away any unused portion if using a single-dose vial. Multi-dose vials can be kept in the refrigerator for up to 21 days after the initial dose. Throw away unused medicine. NOTE: This sheet is a summary. It may not cover all possible information. If you have questions about this medicine, talk to your doctor, pharmacist, or health care provider.  2015, Elsevier/Gold Standard. (2008-10-28 10:25:44)

## 2014-05-29 ENCOUNTER — Encounter (HOSPITAL_COMMUNITY)
Admission: RE | Admit: 2014-05-29 | Discharge: 2014-05-29 | Disposition: A | Discharge status: Home / Self Care | Payer: BC Managed Care – PPO | Source: Ambulatory Visit | Attending: Nephrology | Admitting: Nephrology

## 2014-05-29 DIAGNOSIS — D638 Anemia in other chronic diseases classified elsewhere: Secondary | ICD-10-CM | POA: Diagnosis not present

## 2014-05-29 DIAGNOSIS — N185 Chronic kidney disease, stage 5: Secondary | ICD-10-CM | POA: Insufficient documentation

## 2014-05-29 LAB — RENAL FUNCTION PANEL
ALBUMIN: 4.3 g/dL (ref 3.5–5.2)
ANION GAP: 23 — AB (ref 5–15)
BUN: 89 mg/dL — ABNORMAL HIGH (ref 6–23)
CHLORIDE: 101 meq/L (ref 96–112)
CO2: 18 meq/L — AB (ref 19–32)
Calcium: 9.4 mg/dL (ref 8.4–10.5)
Creatinine, Ser: 4.71 mg/dL — ABNORMAL HIGH (ref 0.50–1.35)
GFR calc Af Amer: 15 mL/min — ABNORMAL LOW (ref >90)
GFR, EST NON AFRICAN AMERICAN: 13 mL/min — AB (ref >90)
Glucose, Bld: 132 mg/dL — ABNORMAL HIGH (ref 70–99)
Phosphorus: 5.9 mg/dL — ABNORMAL HIGH (ref 2.3–4.6)
Potassium: 5.3 mEq/L (ref 3.7–5.3)
Sodium: 142 mEq/L (ref 137–147)

## 2014-05-29 LAB — POCT HEMOGLOBIN-HEMACUE: HEMOGLOBIN: 10.5 g/dL — AB (ref 13.0–17.0)

## 2014-05-29 LAB — IRON AND TIBC
IRON: 79 ug/dL (ref 42–135)
Saturation Ratios: 22 % (ref 20–55)
TIBC: 357 ug/dL (ref 215–435)
UIBC: 278 ug/dL (ref 125–400)

## 2014-05-29 LAB — FERRITIN: Ferritin: 699 ng/mL — ABNORMAL HIGH (ref 22–322)

## 2014-05-29 MED ORDER — EPOETIN ALFA 20000 UNIT/ML IJ SOLN
INTRAMUSCULAR | Status: AC
  Filled 2014-05-29: qty 1

## 2014-05-29 MED ORDER — EPOETIN ALFA 20000 UNIT/ML IJ SOLN
20000.0000 [IU] | INTRAMUSCULAR | Status: DC
  Administered 2014-05-29: 20000 [IU] via SUBCUTANEOUS

## 2014-05-30 LAB — PTH, INTACT AND CALCIUM
Calcium, Total (PTH): 9.2 mg/dL (ref 8.4–10.5)
PTH: 243.5 pg/mL — ABNORMAL HIGH (ref 14.0–72.0)

## 2014-06-13 ENCOUNTER — Encounter (HOSPITAL_COMMUNITY): Payer: BC Managed Care – PPO

## 2014-06-16 ENCOUNTER — Encounter (HOSPITAL_COMMUNITY)
Admission: RE | Admit: 2014-06-16 | Discharge: 2014-06-16 | Disposition: A | Discharge status: Home / Self Care | Payer: BC Managed Care – PPO | Source: Ambulatory Visit | Attending: Nephrology | Admitting: Nephrology

## 2014-06-16 DIAGNOSIS — D638 Anemia in other chronic diseases classified elsewhere: Secondary | ICD-10-CM | POA: Diagnosis not present

## 2014-06-16 LAB — POCT HEMOGLOBIN-HEMACUE: Hemoglobin: 11.7 g/dL — ABNORMAL LOW (ref 13.0–17.0)

## 2014-06-16 MED ORDER — EPOETIN ALFA 20000 UNIT/ML IJ SOLN: 20000.0000 [IU] | INTRAMUSCULAR | Status: DC

## 2014-06-16 MED ORDER — EPOETIN ALFA 20000 UNIT/ML IJ SOLN
INTRAMUSCULAR | Status: AC
  Administered 2014-06-16: 20000 [IU] via SUBCUTANEOUS
  Filled 2014-06-16: qty 1

## 2014-06-30 ENCOUNTER — Encounter (HOSPITAL_COMMUNITY)
Admission: RE | Admit: 2014-06-30 | Discharge: 2014-06-30 | Disposition: A | Discharge status: Home / Self Care | Payer: BC Managed Care – PPO | Source: Ambulatory Visit | Attending: Nephrology | Admitting: Nephrology

## 2014-06-30 DIAGNOSIS — N185 Chronic kidney disease, stage 5: Secondary | ICD-10-CM | POA: Insufficient documentation

## 2014-06-30 DIAGNOSIS — D638 Anemia in other chronic diseases classified elsewhere: Secondary | ICD-10-CM | POA: Diagnosis not present

## 2014-06-30 LAB — RENAL FUNCTION PANEL
ALBUMIN: 3.9 g/dL (ref 3.5–5.2)
ANION GAP: 20 — AB (ref 5–15)
BUN: 80 mg/dL — ABNORMAL HIGH (ref 6–23)
CALCIUM: 9.6 mg/dL (ref 8.4–10.5)
CO2: 20 mEq/L (ref 19–32)
Chloride: 101 mEq/L (ref 96–112)
Creatinine, Ser: 4.26 mg/dL — ABNORMAL HIGH (ref 0.50–1.35)
GFR calc Af Amer: 17 mL/min — ABNORMAL LOW (ref >90)
GFR calc non Af Amer: 15 mL/min — ABNORMAL LOW (ref >90)
Glucose, Bld: 125 mg/dL — ABNORMAL HIGH (ref 70–99)
POTASSIUM: 4.7 meq/L (ref 3.7–5.3)
Phosphorus: 3.8 mg/dL (ref 2.3–4.6)
SODIUM: 141 meq/L (ref 137–147)

## 2014-06-30 LAB — IRON AND TIBC
Iron: 86 ug/dL (ref 42–135)
Saturation Ratios: 25 % (ref 20–55)
TIBC: 341 ug/dL (ref 215–435)
UIBC: 255 ug/dL (ref 125–400)

## 2014-06-30 LAB — POCT HEMOGLOBIN-HEMACUE: HEMOGLOBIN: 12.5 g/dL — AB (ref 13.0–17.0)

## 2014-06-30 LAB — FERRITIN: Ferritin: 680 ng/mL — ABNORMAL HIGH (ref 22–322)

## 2014-06-30 MED ORDER — EPOETIN ALFA 20000 UNIT/ML IJ SOLN: 20000.0000 [IU] | INTRAMUSCULAR | Status: DC

## 2014-07-01 LAB — PTH, INTACT AND CALCIUM
CALCIUM TOTAL (PTH): 9.5 mg/dL (ref 8.4–10.5)
PTH: 224.7 pg/mL — ABNORMAL HIGH (ref 14.0–72.0)

## 2014-07-14 ENCOUNTER — Encounter (HOSPITAL_COMMUNITY): Payer: BC Managed Care – PPO

## 2014-07-25 ENCOUNTER — Other Ambulatory Visit (HOSPITAL_COMMUNITY): Payer: Self-pay | Admitting: *Deleted

## 2014-07-28 ENCOUNTER — Encounter (HOSPITAL_COMMUNITY): Payer: BC Managed Care – PPO

## 2014-07-30 ENCOUNTER — Encounter (HOSPITAL_COMMUNITY)
Admission: RE | Admit: 2014-07-30 | Discharge: 2014-07-30 | Disposition: A | Discharge status: Home / Self Care | Payer: BC Managed Care – PPO | Source: Ambulatory Visit | Attending: Nephrology | Admitting: Nephrology

## 2014-07-30 DIAGNOSIS — D638 Anemia in other chronic diseases classified elsewhere: Secondary | ICD-10-CM | POA: Insufficient documentation

## 2014-07-30 DIAGNOSIS — N185 Chronic kidney disease, stage 5: Secondary | ICD-10-CM | POA: Insufficient documentation

## 2014-07-30 LAB — RENAL FUNCTION PANEL
ALBUMIN: 4.1 g/dL (ref 3.5–5.2)
Anion gap: 20 — ABNORMAL HIGH (ref 5–15)
BUN: 76 mg/dL — AB (ref 6–23)
CALCIUM: 9.1 mg/dL (ref 8.4–10.5)
CHLORIDE: 102 meq/L (ref 96–112)
CO2: 19 mEq/L (ref 19–32)
Creatinine, Ser: 4.95 mg/dL — ABNORMAL HIGH (ref 0.50–1.35)
GFR calc Af Amer: 15 mL/min — ABNORMAL LOW (ref >90)
GFR, EST NON AFRICAN AMERICAN: 13 mL/min — AB (ref >90)
Glucose, Bld: 111 mg/dL — ABNORMAL HIGH (ref 70–99)
Phosphorus: 5 mg/dL — ABNORMAL HIGH (ref 2.3–4.6)
Potassium: 4.8 mEq/L (ref 3.7–5.3)
Sodium: 141 mEq/L (ref 137–147)

## 2014-07-30 LAB — IRON AND TIBC
IRON: 166 ug/dL — AB (ref 42–135)
Saturation Ratios: 52 % (ref 20–55)
TIBC: 322 ug/dL (ref 215–435)
UIBC: 156 ug/dL (ref 125–400)

## 2014-07-30 LAB — FERRITIN: Ferritin: 874 ng/mL — ABNORMAL HIGH (ref 22–322)

## 2014-07-30 LAB — POCT HEMOGLOBIN-HEMACUE: Hemoglobin: 11.2 g/dL — ABNORMAL LOW (ref 13.0–17.0)

## 2014-07-30 MED ORDER — EPOETIN ALFA 20000 UNIT/ML IJ SOLN
INTRAMUSCULAR | Status: AC
  Administered 2014-07-30: 20000 [IU] via SUBCUTANEOUS
  Filled 2014-07-30: qty 1

## 2014-07-30 MED ORDER — EPOETIN ALFA 20000 UNIT/ML IJ SOLN: 20000.0000 [IU] | INTRAMUSCULAR | Status: DC

## 2014-07-31 LAB — PTH, INTACT AND CALCIUM
CALCIUM TOTAL (PTH): 9.1 mg/dL (ref 8.4–10.5)
PTH: 258 pg/mL — AB (ref 14–64)

## 2014-08-05 ENCOUNTER — Other Ambulatory Visit: Payer: Self-pay | Admitting: Internal Medicine

## 2014-08-12 ENCOUNTER — Other Ambulatory Visit: Payer: Self-pay | Admitting: Internal Medicine

## 2014-08-27 ENCOUNTER — Encounter (HOSPITAL_COMMUNITY)
Admission: RE | Admit: 2014-08-27 | Discharge: 2014-08-27 | Disposition: A | Discharge status: Home / Self Care | Payer: BC Managed Care – PPO | Source: Ambulatory Visit | Attending: Nephrology | Admitting: Nephrology

## 2014-08-27 DIAGNOSIS — D638 Anemia in other chronic diseases classified elsewhere: Secondary | ICD-10-CM | POA: Diagnosis not present

## 2014-08-27 LAB — RENAL FUNCTION PANEL
ANION GAP: 22 — AB (ref 5–15)
Albumin: 4.2 g/dL (ref 3.5–5.2)
BUN: 77 mg/dL — AB (ref 6–23)
CALCIUM: 9.1 mg/dL (ref 8.4–10.5)
CO2: 19 meq/L (ref 19–32)
Chloride: 102 mEq/L (ref 96–112)
Creatinine, Ser: 5.08 mg/dL — ABNORMAL HIGH (ref 0.50–1.35)
GFR calc Af Amer: 14 mL/min — ABNORMAL LOW (ref >90)
GFR calc non Af Amer: 12 mL/min — ABNORMAL LOW (ref >90)
GLUCOSE: 129 mg/dL — AB (ref 70–99)
PHOSPHORUS: 5.6 mg/dL — AB (ref 2.3–4.6)
POTASSIUM: 4.8 meq/L (ref 3.7–5.3)
Sodium: 143 mEq/L (ref 137–147)

## 2014-08-27 LAB — POCT HEMOGLOBIN-HEMACUE: Hemoglobin: 11.3 g/dL — ABNORMAL LOW (ref 13.0–17.0)

## 2014-08-27 LAB — IRON AND TIBC
Iron: 112 ug/dL (ref 42–135)
SATURATION RATIOS: 34 % (ref 20–55)
TIBC: 333 ug/dL (ref 215–435)
UIBC: 221 ug/dL (ref 125–400)

## 2014-08-27 LAB — FERRITIN: Ferritin: 690 ng/mL — ABNORMAL HIGH (ref 22–322)

## 2014-08-27 MED ORDER — EPOETIN ALFA 20000 UNIT/ML IJ SOLN
20000.0000 [IU] | INTRAMUSCULAR | Status: DC
  Administered 2014-08-27: 20000 [IU] via SUBCUTANEOUS

## 2014-08-27 MED ORDER — EPOETIN ALFA 20000 UNIT/ML IJ SOLN
INTRAMUSCULAR | Status: DC
Start: 2014-08-27 — End: 2014-08-28
  Filled 2014-08-27: qty 1

## 2014-08-28 LAB — PTH, INTACT AND CALCIUM
Calcium, Total (PTH): 8.9 mg/dL (ref 8.4–10.5)
PTH: 250 pg/mL — ABNORMAL HIGH (ref 14–64)

## 2014-09-24 ENCOUNTER — Encounter (HOSPITAL_COMMUNITY): Payer: BC Managed Care – PPO

## 2014-09-29 ENCOUNTER — Inpatient Hospital Stay (HOSPITAL_COMMUNITY): Admission: RE | Admit: 2014-09-29 | Payer: BC Managed Care – PPO | Source: Ambulatory Visit

## 2014-10-01 ENCOUNTER — Encounter (HOSPITAL_COMMUNITY)
Admission: RE | Admit: 2014-10-01 | Discharge: 2014-10-01 | Disposition: A | Discharge status: Home / Self Care | Payer: BC Managed Care – PPO | Source: Ambulatory Visit | Attending: Nephrology | Admitting: Nephrology

## 2014-10-01 DIAGNOSIS — D631 Anemia in chronic kidney disease: Secondary | ICD-10-CM | POA: Diagnosis not present

## 2014-10-01 DIAGNOSIS — N185 Chronic kidney disease, stage 5: Secondary | ICD-10-CM | POA: Insufficient documentation

## 2014-10-01 LAB — RENAL FUNCTION PANEL
ANION GAP: 18 — AB (ref 5–15)
Albumin: 4.1 g/dL (ref 3.5–5.2)
BUN: 57 mg/dL — AB (ref 6–23)
CHLORIDE: 101 meq/L (ref 96–112)
CO2: 23 mEq/L (ref 19–32)
Calcium: 9.5 mg/dL (ref 8.4–10.5)
Creatinine, Ser: 4.1 mg/dL — ABNORMAL HIGH (ref 0.50–1.35)
GFR calc Af Amer: 18 mL/min — ABNORMAL LOW (ref >90)
GFR calc non Af Amer: 16 mL/min — ABNORMAL LOW (ref >90)
GLUCOSE: 127 mg/dL — AB (ref 70–99)
POTASSIUM: 4.3 meq/L (ref 3.7–5.3)
Phosphorus: 3.3 mg/dL (ref 2.3–4.6)
Sodium: 142 mEq/L (ref 137–147)

## 2014-10-01 LAB — POCT HEMOGLOBIN-HEMACUE: Hemoglobin: 11.8 g/dL — ABNORMAL LOW (ref 13.0–17.0)

## 2014-10-01 LAB — IRON AND TIBC
IRON: 103 ug/dL (ref 42–135)
SATURATION RATIOS: 33 % (ref 20–55)
TIBC: 309 ug/dL (ref 215–435)
UIBC: 206 ug/dL (ref 125–400)

## 2014-10-01 LAB — FERRITIN: FERRITIN: 723 ng/mL — AB (ref 22–322)

## 2014-10-01 MED ORDER — EPOETIN ALFA 20000 UNIT/ML IJ SOLN
INTRAMUSCULAR | Status: AC
  Filled 2014-10-01: qty 1

## 2014-10-01 MED ORDER — EPOETIN ALFA 20000 UNIT/ML IJ SOLN
20000.0000 [IU] | INTRAMUSCULAR | Status: DC
  Administered 2014-10-01: 20000 [IU] via SUBCUTANEOUS

## 2014-10-02 LAB — PTH, INTACT AND CALCIUM
Calcium, Total (PTH): 9.2 mg/dL (ref 8.4–10.5)
PTH: 253 pg/mL — ABNORMAL HIGH (ref 14–64)

## 2014-10-29 ENCOUNTER — Inpatient Hospital Stay (HOSPITAL_COMMUNITY): Admission: RE | Admit: 2014-10-29 | Payer: BC Managed Care – PPO | Source: Ambulatory Visit

## 2014-12-29 ENCOUNTER — Encounter (HOSPITAL_COMMUNITY)
Admission: RE | Admit: 2014-12-29 | Discharge: 2014-12-29 | Disposition: A | Discharge status: Home / Self Care | Payer: BLUE CROSS/BLUE SHIELD | Source: Ambulatory Visit | Attending: Nephrology | Admitting: Nephrology

## 2014-12-29 DIAGNOSIS — N185 Chronic kidney disease, stage 5: Secondary | ICD-10-CM | POA: Diagnosis not present

## 2014-12-29 DIAGNOSIS — D631 Anemia in chronic kidney disease: Secondary | ICD-10-CM | POA: Insufficient documentation

## 2014-12-29 LAB — IRON AND TIBC
IRON: 101 ug/dL (ref 42–165)
Saturation Ratios: 28 % (ref 20–55)
TIBC: 364 ug/dL (ref 215–435)
UIBC: 263 ug/dL (ref 125–400)

## 2014-12-29 LAB — RENAL FUNCTION PANEL
Albumin: 4.5 g/dL (ref 3.5–5.2)
Anion gap: 12 (ref 5–15)
BUN: 51 mg/dL — AB (ref 6–23)
CO2: 23 mmol/L (ref 19–32)
CREATININE: 4.93 mg/dL — AB (ref 0.50–1.35)
Calcium: 9.7 mg/dL (ref 8.4–10.5)
Chloride: 104 mmol/L (ref 96–112)
GFR calc non Af Amer: 13 mL/min — ABNORMAL LOW (ref >90)
GFR, EST AFRICAN AMERICAN: 15 mL/min — AB (ref >90)
GLUCOSE: 102 mg/dL — AB (ref 70–99)
PHOSPHORUS: 5.5 mg/dL — AB (ref 2.3–4.6)
POTASSIUM: 3.9 mmol/L (ref 3.5–5.1)
Sodium: 139 mmol/L (ref 135–145)

## 2014-12-29 LAB — POCT HEMOGLOBIN-HEMACUE: Hemoglobin: 12.6 g/dL — ABNORMAL LOW (ref 13.0–17.0)

## 2014-12-29 MED ORDER — EPOETIN ALFA 20000 UNIT/ML IJ SOLN: 20000.0000 [IU] | INTRAMUSCULAR | Status: DC

## 2014-12-30 ENCOUNTER — Encounter (HOSPITAL_COMMUNITY): Payer: Self-pay

## 2014-12-30 LAB — PTH, INTACT AND CALCIUM
CALCIUM TOTAL (PTH): 10 mg/dL (ref 8.7–10.2)
PTH: 79 pg/mL — ABNORMAL HIGH (ref 15–65)

## 2014-12-30 LAB — FERRITIN: FERRITIN: 622 ng/mL — AB (ref 22–322)

## 2015-01-09 ENCOUNTER — Other Ambulatory Visit (HOSPITAL_COMMUNITY): Payer: Self-pay | Admitting: *Deleted

## 2015-01-12 ENCOUNTER — Encounter (HOSPITAL_COMMUNITY): Payer: Self-pay

## 2018-07-17 ENCOUNTER — Observation Stay (HOSPITAL_COMMUNITY)
Admission: EM | Admit: 2018-07-17 | Discharge: 2018-07-20 | Disposition: A | Discharge status: Home / Self Care | Payer: BLUE CROSS/BLUE SHIELD | Attending: General Surgery | Admitting: General Surgery

## 2018-07-17 ENCOUNTER — Emergency Department (HOSPITAL_COMMUNITY): Payer: BLUE CROSS/BLUE SHIELD

## 2018-07-17 ENCOUNTER — Encounter (HOSPITAL_COMMUNITY): Payer: Self-pay | Admitting: Emergency Medicine

## 2018-07-17 ENCOUNTER — Other Ambulatory Visit: Payer: Self-pay

## 2018-07-17 DIAGNOSIS — S32019A Unspecified fracture of first lumbar vertebra, initial encounter for closed fracture: Secondary | ICD-10-CM | POA: Insufficient documentation

## 2018-07-17 DIAGNOSIS — F419 Anxiety disorder, unspecified: Secondary | ICD-10-CM | POA: Diagnosis not present

## 2018-07-17 DIAGNOSIS — M549 Dorsalgia, unspecified: Secondary | ICD-10-CM | POA: Diagnosis present

## 2018-07-17 DIAGNOSIS — S22008A Other fracture of unspecified thoracic vertebra, initial encounter for closed fracture: Secondary | ICD-10-CM

## 2018-07-17 DIAGNOSIS — S22079A Unspecified fracture of T9-T10 vertebra, initial encounter for closed fracture: Secondary | ICD-10-CM | POA: Insufficient documentation

## 2018-07-17 DIAGNOSIS — N186 End stage renal disease: Secondary | ICD-10-CM | POA: Diagnosis not present

## 2018-07-17 DIAGNOSIS — Z87891 Personal history of nicotine dependence: Secondary | ICD-10-CM | POA: Insufficient documentation

## 2018-07-17 DIAGNOSIS — Y92410 Unspecified street and highway as the place of occurrence of the external cause: Secondary | ICD-10-CM | POA: Insufficient documentation

## 2018-07-17 DIAGNOSIS — Z8249 Family history of ischemic heart disease and other diseases of the circulatory system: Secondary | ICD-10-CM | POA: Diagnosis not present

## 2018-07-17 DIAGNOSIS — D869 Sarcoidosis, unspecified: Secondary | ICD-10-CM | POA: Diagnosis not present

## 2018-07-17 DIAGNOSIS — I120 Hypertensive chronic kidney disease with stage 5 chronic kidney disease or end stage renal disease: Secondary | ICD-10-CM | POA: Diagnosis not present

## 2018-07-17 DIAGNOSIS — K7460 Unspecified cirrhosis of liver: Secondary | ICD-10-CM | POA: Diagnosis not present

## 2018-07-17 DIAGNOSIS — S301XXA Contusion of abdominal wall, initial encounter: Secondary | ICD-10-CM | POA: Insufficient documentation

## 2018-07-17 DIAGNOSIS — M50321 Other cervical disc degeneration at C4-C5 level: Secondary | ICD-10-CM | POA: Diagnosis not present

## 2018-07-17 DIAGNOSIS — S32029A Unspecified fracture of second lumbar vertebra, initial encounter for closed fracture: Secondary | ICD-10-CM | POA: Insufficient documentation

## 2018-07-17 DIAGNOSIS — Z79899 Other long term (current) drug therapy: Secondary | ICD-10-CM | POA: Insufficient documentation

## 2018-07-17 DIAGNOSIS — S22069A Unspecified fracture of T7-T8 vertebra, initial encounter for closed fracture: Principal | ICD-10-CM | POA: Insufficient documentation

## 2018-07-17 DIAGNOSIS — D631 Anemia in chronic kidney disease: Secondary | ICD-10-CM | POA: Insufficient documentation

## 2018-07-17 DIAGNOSIS — Z992 Dependence on renal dialysis: Secondary | ICD-10-CM | POA: Insufficient documentation

## 2018-07-17 DIAGNOSIS — S20211A Contusion of right front wall of thorax, initial encounter: Secondary | ICD-10-CM | POA: Diagnosis not present

## 2018-07-17 DIAGNOSIS — S20219A Contusion of unspecified front wall of thorax, initial encounter: Secondary | ICD-10-CM

## 2018-07-17 DIAGNOSIS — S32008A Other fracture of unspecified lumbar vertebra, initial encounter for closed fracture: Secondary | ICD-10-CM

## 2018-07-17 LAB — COMPREHENSIVE METABOLIC PANEL
ALK PHOS: 72 U/L (ref 38–126)
ALT: 39 U/L (ref 0–44)
AST: 44 U/L — ABNORMAL HIGH (ref 15–41)
Albumin: 4.1 g/dL (ref 3.5–5.0)
Anion gap: 15 (ref 5–15)
BUN: 75 mg/dL — AB (ref 6–20)
CALCIUM: 9.4 mg/dL (ref 8.9–10.3)
CO2: 22 mmol/L (ref 22–32)
Chloride: 97 mmol/L — ABNORMAL LOW (ref 98–111)
Creatinine, Ser: 9.59 mg/dL — ABNORMAL HIGH (ref 0.61–1.24)
GFR calc Af Amer: 6 mL/min — ABNORMAL LOW (ref >60)
GFR, EST NON AFRICAN AMERICAN: 5 mL/min — AB (ref >60)
Glucose, Bld: 96 mg/dL (ref 70–99)
POTASSIUM: 4.1 mmol/L (ref 3.5–5.1)
Sodium: 134 mmol/L — ABNORMAL LOW (ref 135–145)
Total Bilirubin: 1.1 mg/dL (ref 0.3–1.2)
Total Protein: 7.3 g/dL (ref 6.5–8.1)

## 2018-07-17 LAB — CBC WITH DIFFERENTIAL/PLATELET
Abs Immature Granulocytes: 0.1 10*3/uL (ref 0.0–0.1)
Basophils Absolute: 0 10*3/uL (ref 0.0–0.1)
Basophils Relative: 0 %
EOS ABS: 0.1 10*3/uL (ref 0.0–0.7)
Eosinophils Relative: 1 %
HCT: 36 % — ABNORMAL LOW (ref 39.0–52.0)
Hemoglobin: 12.1 g/dL — ABNORMAL LOW (ref 13.0–17.0)
Immature Granulocytes: 1 %
LYMPHS ABS: 0.6 10*3/uL — AB (ref 0.7–4.0)
Lymphocytes Relative: 7 %
MCH: 33.8 pg (ref 26.0–34.0)
MCHC: 33.6 g/dL (ref 30.0–36.0)
MCV: 100.6 fL — ABNORMAL HIGH (ref 78.0–100.0)
Monocytes Absolute: 0.5 10*3/uL (ref 0.1–1.0)
Monocytes Relative: 6 %
NEUTROS PCT: 85 %
Neutro Abs: 7.2 10*3/uL (ref 1.7–7.7)
Platelets: UNDETERMINED 10*3/uL (ref 150–400)
RBC: 3.58 MIL/uL — ABNORMAL LOW (ref 4.22–5.81)
RDW: 12 % (ref 11.5–15.5)
WBC: 8.5 10*3/uL (ref 4.0–10.5)

## 2018-07-17 MED ORDER — TRAZODONE HCL 50 MG PO TABS: 50.0000 mg | ORAL_TABLET | Freq: Every evening | ORAL | Status: DC | PRN

## 2018-07-17 MED ORDER — OXYCODONE-ACETAMINOPHEN 5-325 MG PO TABS
1.0000 | ORAL_TABLET | Freq: Once | ORAL | Status: AC
  Administered 2018-07-17: 1 via ORAL
  Filled 2018-07-17: qty 1

## 2018-07-17 MED ORDER — ENOXAPARIN SODIUM 40 MG/0.4ML ~~LOC~~ SOLN
40.0000 mg | SUBCUTANEOUS | Status: DC
  Administered 2018-07-17 – 2018-07-19 (×3): 40 mg via SUBCUTANEOUS
  Filled 2018-07-17 (×3): qty 0.4

## 2018-07-17 MED ORDER — CHLORHEXIDINE GLUCONATE CLOTH 2 % EX PADS
6.0000 | MEDICATED_PAD | Freq: Every day | CUTANEOUS | Status: DC
  Administered 2018-07-18 – 2018-07-20 (×2): 6 via TOPICAL

## 2018-07-17 MED ORDER — TRAMADOL HCL 50 MG PO TABS: 50.0000 mg | ORAL_TABLET | Freq: Four times a day (QID) | ORAL | Status: DC | PRN

## 2018-07-17 MED ORDER — AMLODIPINE BESYLATE 10 MG PO TABS
10.0000 mg | ORAL_TABLET | Freq: Every day | ORAL | Status: DC
  Administered 2018-07-17 – 2018-07-20 (×3): 10 mg via ORAL
  Filled 2018-07-17 (×4): qty 1

## 2018-07-17 MED ORDER — IOHEXOL 300 MG/ML  SOLN
100.0000 mL | Freq: Once | INTRAMUSCULAR | Status: AC
  Administered 2018-07-17: 100 mL via INTRAVENOUS

## 2018-07-17 MED ORDER — HYDROCODONE-ACETAMINOPHEN 10-325 MG PO TABS
1.0000 | ORAL_TABLET | Freq: Four times a day (QID) | ORAL | Status: DC | PRN
  Administered 2018-07-17 – 2018-07-18 (×3): 1 via ORAL
  Filled 2018-07-17 (×3): qty 1

## 2018-07-17 MED ORDER — TRAMADOL HCL 50 MG PO TABS
50.0000 mg | ORAL_TABLET | Freq: Four times a day (QID) | ORAL | Status: DC | PRN
  Administered 2018-07-17 – 2018-07-18 (×2): 100 mg via ORAL
  Filled 2018-07-17 (×2): qty 2

## 2018-07-17 MED ORDER — DOXERCALCIFEROL 4 MCG/2ML IV SOLN
3.0000 ug | Freq: Once | INTRAVENOUS | Status: DC
  Filled 2018-07-17: qty 2

## 2018-07-17 MED ORDER — ALPRAZOLAM 0.5 MG PO TABS: 1.0000 mg | ORAL_TABLET | Freq: Two times a day (BID) | ORAL | Status: DC | PRN

## 2018-07-17 MED ORDER — HYDROCODONE-ACETAMINOPHEN 10-325 MG PO TABS: 2.0000 | ORAL_TABLET | Freq: Four times a day (QID) | ORAL | Status: DC | PRN

## 2018-07-17 MED ORDER — DOCUSATE SODIUM 100 MG PO CAPS
100.0000 mg | ORAL_CAPSULE | Freq: Two times a day (BID) | ORAL | Status: DC
  Administered 2018-07-17 – 2018-07-20 (×5): 100 mg via ORAL
  Filled 2018-07-17 (×6): qty 1

## 2018-07-17 MED ORDER — HYDROMORPHONE HCL 1 MG/ML IJ SOLN
1.0000 mg | INTRAMUSCULAR | Status: DC | PRN
  Administered 2018-07-17 – 2018-07-18 (×5): 1 mg via INTRAVENOUS
  Filled 2018-07-17 (×4): qty 1

## 2018-07-17 MED ORDER — SODIUM CHLORIDE (HYPERTONIC) 2 % OP SOLN
2.0000 [drp] | OPHTHALMIC | Status: DC | PRN
  Filled 2018-07-17: qty 15

## 2018-07-17 MED ORDER — ONDANSETRON 4 MG PO TBDP: 4.0000 mg | ORAL_TABLET | Freq: Four times a day (QID) | ORAL | Status: DC | PRN

## 2018-07-17 MED ORDER — FUROSEMIDE 20 MG PO TABS: 20.0000 mg | ORAL_TABLET | Freq: Every day | ORAL | Status: DC | PRN

## 2018-07-17 MED ORDER — LANTHANUM CARBONATE 500 MG PO CHEW
1000.0000 mg | CHEWABLE_TABLET | Freq: Three times a day (TID) | ORAL | Status: DC
  Administered 2018-07-18 – 2018-07-20 (×5): 1000 mg via ORAL
  Filled 2018-07-17 (×6): qty 2

## 2018-07-17 MED ORDER — ONDANSETRON HCL 4 MG/2ML IJ SOLN: 4.0000 mg | Freq: Four times a day (QID) | INTRAMUSCULAR | Status: DC | PRN

## 2018-07-17 MED ORDER — DOXERCALCIFEROL 4 MCG/2ML IV SOLN
3.0000 ug | INTRAVENOUS | Status: DC
  Administered 2018-07-18: 3 ug via INTRAVENOUS
  Filled 2018-07-17 (×2): qty 2

## 2018-07-17 MED ORDER — FERRIC CITRATE 1 GM 210 MG(FE) PO TABS: 420.0000 mg | ORAL_TABLET | Freq: Three times a day (TID) | ORAL | Status: DC

## 2018-07-17 MED ORDER — HYDROMORPHONE HCL 1 MG/ML IJ SOLN
1.0000 mg | Freq: Once | INTRAMUSCULAR | Status: AC
  Administered 2018-07-17: 1 mg via INTRAVENOUS
  Filled 2018-07-17: qty 1

## 2018-07-17 MED ORDER — MORPHINE SULFATE (PF) 2 MG/ML IV SOLN: 2.0000 mg | INTRAVENOUS | Status: DC | PRN

## 2018-07-17 MED ORDER — MORPHINE SULFATE (PF) 4 MG/ML IV SOLN
4.0000 mg | Freq: Once | INTRAVENOUS | Status: AC
  Administered 2018-07-17: 4 mg via INTRAVENOUS
  Filled 2018-07-17: qty 1

## 2018-07-17 MED ORDER — PANTOPRAZOLE SODIUM 40 MG PO TBEC
40.0000 mg | DELAYED_RELEASE_TABLET | Freq: Every day | ORAL | Status: DC
  Administered 2018-07-17 – 2018-07-20 (×4): 40 mg via ORAL
  Filled 2018-07-17 (×4): qty 1

## 2018-07-17 MED ORDER — HYDRALAZINE HCL 20 MG/ML IJ SOLN: 10.0000 mg | INTRAMUSCULAR | Status: DC | PRN

## 2018-07-17 MED ORDER — RENA-VITE PO TABS
1.0000 | ORAL_TABLET | Freq: Every day | ORAL | Status: DC
  Administered 2018-07-18 – 2018-07-20 (×3): 1 via ORAL
  Filled 2018-07-17 (×4): qty 1

## 2018-07-17 MED ORDER — IOPAMIDOL (ISOVUE-300) INJECTION 61%
INTRAVENOUS | Status: AC
  Filled 2018-07-17: qty 100

## 2018-07-17 MED ORDER — CYCLOBENZAPRINE HCL 10 MG PO TABS
10.0000 mg | ORAL_TABLET | Freq: Three times a day (TID) | ORAL | Status: DC | PRN
  Administered 2018-07-17 – 2018-07-18 (×2): 10 mg via ORAL
  Filled 2018-07-17 (×2): qty 1

## 2018-07-17 MED ORDER — CYANOCOBALAMIN 500 MCG PO TABS
250.0000 ug | ORAL_TABLET | Freq: Every day | ORAL | Status: DC
  Administered 2018-07-18 – 2018-07-20 (×2): 250 ug via ORAL
  Filled 2018-07-17 (×4): qty 1

## 2018-07-17 NOTE — ED Notes (Signed)
Pt has a fluid restriction of 40oz per day
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
## 2018-07-17 NOTE — ED Notes (Signed)
Patient returned from CT
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
## 2018-07-17 NOTE — ED Notes (Signed)
ED Provider at bedside. 

## 2018-07-17 NOTE — ED Notes (Signed)
Applied finger splint to patient's right ring finger.

## 2018-07-17 NOTE — ED Notes (Signed)
Patient transported to X-ray 

## 2018-07-17 NOTE — Progress Notes (Signed)
1450 Received pt from ED, A&O x4. Pt c/o low back pain. Right ring finger with a splint intact. Bruise noted to left upper arm.  Family at the bedside.
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
## 2018-07-17 NOTE — Plan of Care (Signed)
  Problem: Pain Managment: Goal: General experience of comfort will improve Outcome: Progressing   
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
## 2018-07-18 LAB — CBC
HCT: 32.1 % — ABNORMAL LOW (ref 39.0–52.0)
Hemoglobin: 10.9 g/dL — ABNORMAL LOW (ref 13.0–17.0)
MCH: 33.6 pg (ref 26.0–34.0)
MCHC: 34 g/dL (ref 30.0–36.0)
MCV: 99.1 fL (ref 78.0–100.0)
PLATELETS: 96 10*3/uL — AB (ref 150–400)
RBC: 3.24 MIL/uL — AB (ref 4.22–5.81)
RDW: 12 % (ref 11.5–15.5)
WBC: 5 10*3/uL (ref 4.0–10.5)

## 2018-07-18 LAB — COMPREHENSIVE METABOLIC PANEL
ALBUMIN: 3.6 g/dL (ref 3.5–5.0)
ALT: 33 U/L (ref 0–44)
AST: 35 U/L (ref 15–41)
Alkaline Phosphatase: 63 U/L (ref 38–126)
Anion gap: 15 (ref 5–15)
BUN: 80 mg/dL — AB (ref 6–20)
CHLORIDE: 99 mmol/L (ref 98–111)
CO2: 21 mmol/L — ABNORMAL LOW (ref 22–32)
CREATININE: 9.17 mg/dL — AB (ref 0.61–1.24)
Calcium: 8.7 mg/dL — ABNORMAL LOW (ref 8.9–10.3)
GFR calc Af Amer: 7 mL/min — ABNORMAL LOW (ref >60)
GFR calc non Af Amer: 6 mL/min — ABNORMAL LOW (ref >60)
GLUCOSE: 103 mg/dL — AB (ref 70–99)
POTASSIUM: 3.8 mmol/L (ref 3.5–5.1)
SODIUM: 135 mmol/L (ref 135–145)
Total Bilirubin: 1 mg/dL (ref 0.3–1.2)
Total Protein: 6.6 g/dL (ref 6.5–8.1)

## 2018-07-18 LAB — HIV ANTIBODY (ROUTINE TESTING W REFLEX): HIV SCREEN 4TH GENERATION: NONREACTIVE

## 2018-07-18 MED ORDER — TRAMADOL HCL 50 MG PO TABS
50.0000 mg | ORAL_TABLET | Freq: Four times a day (QID) | ORAL | Status: DC
  Administered 2018-07-18 – 2018-07-19 (×5): 50 mg via ORAL
  Filled 2018-07-18 (×5): qty 1

## 2018-07-18 MED ORDER — HYDROCODONE-ACETAMINOPHEN 10-325 MG PO TABS
1.0000 | ORAL_TABLET | Freq: Four times a day (QID) | ORAL | Status: DC | PRN
  Administered 2018-07-18 – 2018-07-19 (×3): 2 via ORAL
  Filled 2018-07-18 (×3): qty 2

## 2018-07-18 MED ORDER — ALTEPLASE 2 MG IJ SOLR: 2.0000 mg | Freq: Once | INTRAMUSCULAR | Status: DC | PRN

## 2018-07-18 MED ORDER — SODIUM CHLORIDE 0.9 % IV SOLN: 100.0000 mL | INTRAVENOUS | Status: DC | PRN

## 2018-07-18 MED ORDER — CHLORHEXIDINE GLUCONATE CLOTH 2 % EX PADS
6.0000 | MEDICATED_PAD | Freq: Every day | CUTANEOUS | Status: DC
  Administered 2018-07-19: 6 via TOPICAL

## 2018-07-18 MED ORDER — LIDOCAINE HCL (PF) 1 % IJ SOLN: 5.0000 mL | INTRAMUSCULAR | Status: DC | PRN

## 2018-07-18 MED ORDER — HYDROMORPHONE HCL 1 MG/ML IJ SOLN
1.0000 mg | Freq: Four times a day (QID) | INTRAMUSCULAR | Status: DC | PRN
  Administered 2018-07-18 – 2018-07-20 (×6): 1 mg via INTRAVENOUS
  Filled 2018-07-18 (×6): qty 1

## 2018-07-18 MED ORDER — DOXERCALCIFEROL 4 MCG/2ML IV SOLN
INTRAVENOUS | Status: AC
  Filled 2018-07-18: qty 2

## 2018-07-18 MED ORDER — HYDROMORPHONE HCL 1 MG/ML IJ SOLN
INTRAMUSCULAR | Status: AC
  Filled 2018-07-18: qty 1

## 2018-07-18 MED ORDER — HEPARIN SODIUM (PORCINE) 1000 UNIT/ML DIALYSIS: 1000.0000 [IU] | INTRAMUSCULAR | Status: DC | PRN

## 2018-07-18 MED ORDER — LIDOCAINE-PRILOCAINE 2.5-2.5 % EX CREA: 1.0000 "application " | TOPICAL_CREAM | CUTANEOUS | Status: DC | PRN

## 2018-07-18 MED ORDER — PENTAFLUOROPROP-TETRAFLUOROETH EX AERO: 1.0000 "application " | INHALATION_SPRAY | CUTANEOUS | Status: DC | PRN

## 2018-07-18 NOTE — Progress Notes (Signed)
PT Cancellation Note  Patient Details Name: MCCLAIN SHALL MRN: [id number] DOB: 09-05-1966   Cancelled Treatment:    Reason Eval/Treat Not Completed: Patient at procedure or test/unavailable. Pt off of the floor for HD. PT will continue to follow acutely.    [place] 07/18/2018, 10:01 AM

## 2018-07-18 NOTE — Progress Notes (Signed)
OT Cancellation Note  Patient Details Name: Steven Best MRN: [id number] DOB: 05-25-1966   Cancelled Treatment:    Reason Eval/Treat Not Completed: Patient at procedure or test/ unavailable. Pt off unit for HD at this time. Will check back as able.   Norman Herrlich, MS OTR/L  Pager: [place] 07/18/2018, 9:05 AM
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
## 2018-07-18 NOTE — Progress Notes (Addendum)
0800 Pt at Hemodialysis.
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
## 2018-07-18 NOTE — Progress Notes (Signed)
Orthopedic Tech Progress Note Patient Details:  Steven Best [birth year] [id number]  Patient ID: [name], male   DOB: [birth year], 52 y.o.   MRN: [id number]   Hildred Priest 07/18/2018, 10:16 AM Called in bio-tech brace order; spoke with Bella Kennedy

## 2018-07-18 NOTE — Plan of Care (Signed)
  Problem: Pain Managment: Goal: General experience of comfort will improve Outcome: Progressing   

## 2018-07-19 LAB — CBC
HCT: 31.8 % — ABNORMAL LOW (ref 39.0–52.0)
Hemoglobin: 10.6 g/dL — ABNORMAL LOW (ref 13.0–17.0)
MCH: 33.4 pg (ref 26.0–34.0)
MCHC: 33.3 g/dL (ref 30.0–36.0)
MCV: 100.3 fL — ABNORMAL HIGH (ref 78.0–100.0)
Platelets: 92 10*3/uL — ABNORMAL LOW (ref 150–400)
RBC: 3.17 MIL/uL — ABNORMAL LOW (ref 4.22–5.81)
RDW: 12.2 % (ref 11.5–15.5)
WBC: 4.8 10*3/uL (ref 4.0–10.5)

## 2018-07-19 LAB — RENAL FUNCTION PANEL
Albumin: 3.5 g/dL (ref 3.5–5.0)
Anion gap: 13 (ref 5–15)
BUN: 43 mg/dL — ABNORMAL HIGH (ref 6–20)
CO2: 25 mmol/L (ref 22–32)
Calcium: 9.1 mg/dL (ref 8.9–10.3)
Chloride: 98 mmol/L (ref 98–111)
Creatinine, Ser: 6.64 mg/dL — ABNORMAL HIGH (ref 0.61–1.24)
GFR calc Af Amer: 10 mL/min — ABNORMAL LOW (ref >60)
GFR calc non Af Amer: 9 mL/min — ABNORMAL LOW (ref >60)
Glucose, Bld: 101 mg/dL — ABNORMAL HIGH (ref 70–99)
Phosphorus: 6.2 mg/dL — ABNORMAL HIGH (ref 2.5–4.6)
Potassium: 3.4 mmol/L — ABNORMAL LOW (ref 3.5–5.1)
Sodium: 136 mmol/L (ref 135–145)
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
## 2018-07-19 NOTE — Progress Notes (Signed)
CSW completed SBIRT with patient.  Patient denies substance use. Patient states he quit drinking in 2014.   Thurmond Butts, [place] Social Worker [phone]

## 2018-07-19 NOTE — Progress Notes (Signed)
PT Cancellation Note  Patient Details Name: Steven Best MRN: [id number] DOB: [birth year]   Cancelled Treatment:    Reason Eval/Treat Not Completed: Patient at procedure or test/unavailable. Pt off of the floor for HD. PT will continue to follow acutely.    [place] 07/19/2018, 8:39 AM

## 2018-07-20 MED ORDER — TRAMADOL HCL 50 MG PO TABS: 50.0000 mg | ORAL_TABLET | Freq: Four times a day (QID) | ORAL | 0 refills | Status: DC | PRN

## 2018-07-20 NOTE — Discharge Instructions (Signed)
Wear TLSO brace while you are up, may remove for showers, no physical restrictions with brace on. Follow up with Dr. Trenton Gammon as above in 3 weeks
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
## 2018-07-20 NOTE — Progress Notes (Signed)
Discharge instructions completed with pt.  Pt verbalized understanding of the information.  Pt denies chest pain, shortness of breath, dizziness, lightheadedness, and n/v.  Pt's IV d/c'ed. Pt discharged home.

## 2018-09-12 ENCOUNTER — Encounter (HOSPITAL_COMMUNITY): Payer: Self-pay | Admitting: *Deleted

## 2018-09-12 ENCOUNTER — Other Ambulatory Visit: Payer: Self-pay | Admitting: Surgery

## 2018-09-12 ENCOUNTER — Other Ambulatory Visit: Payer: Self-pay

## 2018-09-12 NOTE — Progress Notes (Signed)
Pt denies SOB, chest pain, and being under the care of a cardiologist. Pt denies having a cardiac cath but stated that a stress test and echo were both performed > 5 years ago. Pt made aware to stop taking vitamins, fish oil and herbal medications. Do not take any NSAIDs ie: Ibuprofen, Advil, Naproxen (Aleve), Motrin, BC and Goody Powder. Pt verbalized understanding of all pre-op instructions. Anesthesia asked to review pt history of anesthesia complication and EKG.

## 2018-09-13 MED ORDER — DEXTROSE 5 % IV SOLN
3.0000 g | INTRAVENOUS | Status: AC
  Administered 2018-09-14: 3 g via INTRAVENOUS
  Filled 2018-09-13: qty 3
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
## 2018-09-14 ENCOUNTER — Ambulatory Visit (HOSPITAL_COMMUNITY): Payer: BLUE CROSS/BLUE SHIELD | Admitting: Vascular Surgery

## 2018-09-14 ENCOUNTER — Encounter (HOSPITAL_COMMUNITY): Payer: Self-pay | Admitting: *Deleted

## 2018-09-14 ENCOUNTER — Ambulatory Visit (HOSPITAL_COMMUNITY)
Admission: RE | Admit: 2018-09-14 | Discharge: 2018-09-14 | Disposition: A | Discharge status: Home / Self Care | Payer: BLUE CROSS/BLUE SHIELD | Source: Ambulatory Visit | Attending: Surgery | Admitting: Surgery

## 2018-09-14 ENCOUNTER — Other Ambulatory Visit: Payer: Self-pay

## 2018-09-14 ENCOUNTER — Encounter (HOSPITAL_COMMUNITY)
Admission: RE | Disposition: A | Discharge status: Home / Self Care | Payer: Self-pay | Source: Ambulatory Visit | Attending: Surgery

## 2018-09-14 DIAGNOSIS — E669 Obesity, unspecified: Secondary | ICD-10-CM | POA: Insufficient documentation

## 2018-09-14 DIAGNOSIS — F419 Anxiety disorder, unspecified: Secondary | ICD-10-CM | POA: Diagnosis not present

## 2018-09-14 DIAGNOSIS — K7460 Unspecified cirrhosis of liver: Secondary | ICD-10-CM | POA: Insufficient documentation

## 2018-09-14 DIAGNOSIS — Z6834 Body mass index (BMI) 34.0-34.9, adult: Secondary | ICD-10-CM | POA: Insufficient documentation

## 2018-09-14 DIAGNOSIS — J449 Chronic obstructive pulmonary disease, unspecified: Secondary | ICD-10-CM | POA: Diagnosis not present

## 2018-09-14 DIAGNOSIS — K219 Gastro-esophageal reflux disease without esophagitis: Secondary | ICD-10-CM | POA: Insufficient documentation

## 2018-09-14 DIAGNOSIS — Z87891 Personal history of nicotine dependence: Secondary | ICD-10-CM | POA: Diagnosis not present

## 2018-09-14 DIAGNOSIS — I120 Hypertensive chronic kidney disease with stage 5 chronic kidney disease or end stage renal disease: Secondary | ICD-10-CM | POA: Diagnosis not present

## 2018-09-14 DIAGNOSIS — G43909 Migraine, unspecified, not intractable, without status migrainosus: Secondary | ICD-10-CM | POA: Diagnosis not present

## 2018-09-14 DIAGNOSIS — T82868A Thrombosis of vascular prosthetic devices, implants and grafts, initial encounter: Secondary | ICD-10-CM | POA: Insufficient documentation

## 2018-09-14 DIAGNOSIS — Y832 Surgical operation with anastomosis, bypass or graft as the cause of abnormal reaction of the patient, or of later complication, without mention of misadventure at the time of the procedure: Secondary | ICD-10-CM | POA: Diagnosis not present

## 2018-09-14 DIAGNOSIS — N186 End stage renal disease: Secondary | ICD-10-CM | POA: Insufficient documentation

## 2018-09-14 HISTORY — PX: THROMBECTOMY W/ EMBOLECTOMY: SHX2507

## 2018-09-14 LAB — POCT I-STAT 4, (NA,K, GLUC, HGB,HCT)
Glucose, Bld: 107 mg/dL — ABNORMAL HIGH (ref 70–99)
HEMATOCRIT: 27 % — AB (ref 39.0–52.0)
Hemoglobin: 9.2 g/dL — ABNORMAL LOW (ref 13.0–17.0)
POTASSIUM: 3.9 mmol/L (ref 3.5–5.1)
Sodium: 138 mmol/L (ref 135–145)

## 2018-09-14 LAB — PLATELET COUNT: Platelets: 124 10*3/uL — ABNORMAL LOW (ref 150–400)

## 2018-09-14 SURGERY — THROMBECTOMY ARTERIOVENOUS FISTULA
Anesthesia: General | Site: Arm Lower | Laterality: Left

## 2018-09-14 MED ORDER — SODIUM CHLORIDE 0.9 % IV SOLN
INTRAVENOUS | Status: DC | PRN
  Administered 2018-09-14: 30 ug/min via INTRAVENOUS

## 2018-09-14 MED ORDER — OXYCODONE HCL 5 MG PO TABS
5.0000 mg | ORAL_TABLET | Freq: Once | ORAL | Status: AC | PRN
  Administered 2018-09-14: 5 mg via ORAL

## 2018-09-14 MED ORDER — MIDAZOLAM HCL 5 MG/5ML IJ SOLN
INTRAMUSCULAR | Status: DC | PRN
  Administered 2018-09-14: 2 mg via INTRAVENOUS

## 2018-09-14 MED ORDER — SODIUM CHLORIDE 0.9 % IV SOLN: INTRAVENOUS | Status: DC

## 2018-09-14 MED ORDER — ONDANSETRON HCL 4 MG/2ML IJ SOLN
INTRAMUSCULAR | Status: DC | PRN
  Administered 2018-09-14: 4 mg via INTRAVENOUS

## 2018-09-14 MED ORDER — SUCCINYLCHOLINE CHLORIDE 200 MG/10ML IV SOSY
PREFILLED_SYRINGE | INTRAVENOUS | Status: AC
  Filled 2018-09-14: qty 10

## 2018-09-14 MED ORDER — HEMOSTATIC AGENTS (NO CHARGE) OPTIME
TOPICAL | Status: DC | PRN
  Administered 2018-09-14: 1 via TOPICAL

## 2018-09-14 MED ORDER — PROPOFOL 10 MG/ML IV BOLUS
INTRAVENOUS | Status: DC | PRN
  Administered 2018-09-14: 150 mg via INTRAVENOUS

## 2018-09-14 MED ORDER — HEPARIN SODIUM (PORCINE) 1000 UNIT/ML IJ SOLN
INTRAMUSCULAR | Status: DC | PRN
  Administered 2018-09-14: 5000 [IU] via INTRAVENOUS

## 2018-09-14 MED ORDER — PHENYLEPHRINE 40 MCG/ML (10ML) SYRINGE FOR IV PUSH (FOR BLOOD PRESSURE SUPPORT)
PREFILLED_SYRINGE | INTRAVENOUS | Status: AC
  Filled 2018-09-14: qty 10

## 2018-09-14 MED ORDER — SODIUM CHLORIDE 0.9 % IV SOLN
INTRAVENOUS | Status: DC
  Administered 2018-09-14: 07:00:00 via INTRAVENOUS

## 2018-09-14 MED ORDER — LIDOCAINE HCL (CARDIAC) PF 100 MG/5ML IV SOSY
PREFILLED_SYRINGE | INTRAVENOUS | Status: DC | PRN
  Administered 2018-09-14: 80 mg via INTRAVENOUS

## 2018-09-14 MED ORDER — OXYCODONE HCL 5 MG PO TABS: 5.0000 mg | ORAL_TABLET | Freq: Three times a day (TID) | ORAL | 0 refills | Status: DC | PRN

## 2018-09-14 MED ORDER — 0.9 % SODIUM CHLORIDE (POUR BTL) OPTIME
TOPICAL | Status: DC | PRN
  Administered 2018-09-14: 1000 mL

## 2018-09-14 MED ORDER — PROMETHAZINE HCL 25 MG/ML IJ SOLN: 6.2500 mg | INTRAMUSCULAR | Status: DC | PRN

## 2018-09-14 MED ORDER — OXYCODONE HCL 5 MG PO TABS
ORAL_TABLET | ORAL | Status: AC
  Administered 2018-09-14: 5 mg via ORAL
  Filled 2018-09-14: qty 1

## 2018-09-14 MED ORDER — EPHEDRINE 5 MG/ML INJ
INTRAVENOUS | Status: AC
  Filled 2018-09-14: qty 10

## 2018-09-14 MED ORDER — DEXAMETHASONE SODIUM PHOSPHATE 10 MG/ML IJ SOLN
INTRAMUSCULAR | Status: AC
  Filled 2018-09-14: qty 2

## 2018-09-14 MED ORDER — PROPOFOL 10 MG/ML IV BOLUS
INTRAVENOUS | Status: AC
  Filled 2018-09-14: qty 20

## 2018-09-14 MED ORDER — MIDAZOLAM HCL 2 MG/2ML IJ SOLN
INTRAMUSCULAR | Status: AC
  Filled 2018-09-14: qty 2

## 2018-09-14 MED ORDER — LIDOCAINE 2% (20 MG/ML) 5 ML SYRINGE
INTRAMUSCULAR | Status: AC
  Filled 2018-09-14: qty 5

## 2018-09-14 MED ORDER — SODIUM CHLORIDE 0.9 % IV SOLN
INTRAVENOUS | Status: AC
  Filled 2018-09-14: qty 1.2

## 2018-09-14 MED ORDER — FENTANYL CITRATE (PF) 250 MCG/5ML IJ SOLN
INTRAMUSCULAR | Status: AC
  Filled 2018-09-14: qty 5

## 2018-09-14 MED ORDER — PHENYLEPHRINE HCL 10 MG/ML IJ SOLN
INTRAMUSCULAR | Status: DC | PRN
  Administered 2018-09-14: 80 ug via INTRAVENOUS
  Administered 2018-09-14 (×2): 160 ug via INTRAVENOUS

## 2018-09-14 MED ORDER — HYDROMORPHONE HCL 1 MG/ML IJ SOLN
INTRAMUSCULAR | Status: AC
  Administered 2018-09-14: 0.25 mg via INTRAVENOUS
  Filled 2018-09-14: qty 1

## 2018-09-14 MED ORDER — SODIUM CHLORIDE 0.9 % IV SOLN
INTRAVENOUS | Status: DC | PRN
  Administered 2018-09-14: 500 mL

## 2018-09-14 MED ORDER — ROCURONIUM BROMIDE 50 MG/5ML IV SOSY
PREFILLED_SYRINGE | INTRAVENOUS | Status: AC
  Filled 2018-09-14: qty 5

## 2018-09-14 MED ORDER — OXYCODONE HCL 5 MG/5ML PO SOLN: 5.0000 mg | Freq: Once | ORAL | Status: AC | PRN

## 2018-09-14 MED ORDER — LIDOCAINE-EPINEPHRINE (PF) 1 %-1:200000 IJ SOLN
INTRAMUSCULAR | Status: AC
  Filled 2018-09-14: qty 30

## 2018-09-14 MED ORDER — PROTAMINE SULFATE 10 MG/ML IV SOLN
INTRAVENOUS | Status: DC | PRN
  Administered 2018-09-14: 50 mg via INTRAVENOUS

## 2018-09-14 MED ORDER — FENTANYL CITRATE (PF) 250 MCG/5ML IJ SOLN
INTRAMUSCULAR | Status: DC | PRN
  Administered 2018-09-14 (×5): 50 ug via INTRAVENOUS

## 2018-09-14 MED ORDER — ONDANSETRON HCL 4 MG/2ML IJ SOLN
INTRAMUSCULAR | Status: AC
  Filled 2018-09-14: qty 2

## 2018-09-14 MED ORDER — HYDROMORPHONE HCL 1 MG/ML IJ SOLN
0.2500 mg | INTRAMUSCULAR | Status: DC | PRN
  Administered 2018-09-14 (×2): 0.25 mg via INTRAVENOUS
  Administered 2018-09-14: 0.5 mg via INTRAVENOUS

## 2018-09-14 SURGICAL SUPPLY — 42 items
ARMBAND PINK RESTRICT EXTREMIT (MISCELLANEOUS) ×4 IMPLANT
CANISTER SUCT 3000ML PPV (MISCELLANEOUS) ×2 IMPLANT
CATH EMB 3FR 40CM (CATHETERS) ×2 IMPLANT
CATH EMB 4FR 80CM (CATHETERS) ×2 IMPLANT
CLIP VESOCCLUDE MED 6/CT (CLIP) ×2 IMPLANT
CLIP VESOCCLUDE SM WIDE 6/CT (CLIP) ×2 IMPLANT
COVER MAYO STAND STRL (DRAPES) ×2 IMPLANT
COVER WAND RF STERILE (DRAPES) ×2 IMPLANT
DERMABOND ADVANCED (GAUZE/BANDAGES/DRESSINGS) ×1
DERMABOND ADVANCED .7 DNX12 (GAUZE/BANDAGES/DRESSINGS) ×1 IMPLANT
DRAPE X-RAY CASS 24X20 (DRAPES) IMPLANT
ELECT REM PT RETURN 9FT ADLT (ELECTROSURGICAL) ×2
ELECTRODE REM PT RTRN 9FT ADLT (ELECTROSURGICAL) ×1 IMPLANT
GLOVE BIOGEL PI IND STRL 6.5 (GLOVE) ×2 IMPLANT
GLOVE BIOGEL PI IND STRL 7.0 (GLOVE) ×2 IMPLANT
GLOVE BIOGEL PI IND STRL 7.5 (GLOVE) ×1 IMPLANT
GLOVE BIOGEL PI INDICATOR 6.5 (GLOVE) ×2
GLOVE BIOGEL PI INDICATOR 7.0 (GLOVE) ×2
GLOVE BIOGEL PI INDICATOR 7.5 (GLOVE) ×1
GLOVE INDICATOR 6.5 STRL GRN (GLOVE) ×2 IMPLANT
GLOVE INDICATOR 7.5 STRL GRN (GLOVE) ×2 IMPLANT
GLOVE SURG SS PI 7.5 STRL IVOR (GLOVE) ×2 IMPLANT
GOWN STRL REUS W/ TWL LRG LVL3 (GOWN DISPOSABLE) ×2 IMPLANT
GOWN STRL REUS W/ TWL XL LVL3 (GOWN DISPOSABLE) ×1 IMPLANT
GOWN STRL REUS W/TWL LRG LVL3 (GOWN DISPOSABLE) ×2
GOWN STRL REUS W/TWL XL LVL3 (GOWN DISPOSABLE) ×1
HEMOSTAT SNOW SURGICEL 2X4 (HEMOSTASIS) ×2 IMPLANT
KIT BASIN OR (CUSTOM PROCEDURE TRAY) ×2 IMPLANT
KIT TURNOVER KIT B (KITS) ×2 IMPLANT
NS IRRIG 1000ML POUR BTL (IV SOLUTION) ×2 IMPLANT
PACK CV ACCESS (CUSTOM PROCEDURE TRAY) ×2 IMPLANT
PAD ARMBOARD 7.5X6 YLW CONV (MISCELLANEOUS) ×4 IMPLANT
SET COLLECT BLD 21X3/4 12 (NEEDLE) IMPLANT
STOPCOCK 4 WAY LG BORE MALE ST (IV SETS) IMPLANT
SUT PROLENE 6 0 BV (SUTURE) ×2 IMPLANT
SUT VIC AB 3-0 SH 27 (SUTURE) ×1
SUT VIC AB 3-0 SH 27X BRD (SUTURE) ×1 IMPLANT
SUT VICRYL 4-0 PS2 18IN ABS (SUTURE) IMPLANT
TOWEL GREEN STERILE (TOWEL DISPOSABLE) ×2 IMPLANT
TUBING EXTENTION W/L.L. (IV SETS) IMPLANT
UNDERPAD 30X30 (UNDERPADS AND DIAPERS) ×2 IMPLANT
WATER STERILE IRR 1000ML POUR (IV SOLUTION) ×2 IMPLANT

## 2018-09-14 NOTE — Anesthesia Postprocedure Evaluation (Signed)
Anesthesia Post Note  Patient: Steven Best  Procedure(s) Performed: THROMBECTOMY WITH REVISION left arm ARTERIOVENOUS FISTULA (Left Arm Lower)     Patient location during evaluation: PACU Anesthesia Type: General Level of consciousness: awake and alert Pain management: pain level controlled Vital Signs Assessment: post-procedure vital signs reviewed and stable Respiratory status: spontaneous breathing, nonlabored ventilation and respiratory function stable Cardiovascular status: blood pressure returned to baseline and stable Postop Assessment: no apparent nausea or vomiting Anesthetic complications: no    Last Vitals:  Vitals:   09/14/18 1115 09/14/18 1130  BP:  116/81  Pulse:  72  Resp:    Temp: 37.2 C   SpO2:  100%    Last Pain:  Vitals:   09/14/18 1110  TempSrc:   PainSc: [name]

## 2018-09-14 NOTE — Anesthesia Preprocedure Evaluation (Signed)
Anesthesia Evaluation  Patient identified by MRN, date of birth, ID band Patient awake    Reviewed: Allergy & Precautions, H&P , NPO status , Patient's Chart, lab work & pertinent test results  History of Anesthesia Complications (+) Family history of anesthesia reaction and history of anesthetic complications  Airway Mallampati: II  TM Distance: >3 FB Neck ROM: Full    Dental  (+) Chipped, Dental Advisory Given   Pulmonary COPD, former smoker,    Pulmonary exam normal breath sounds clear to auscultation- rhonchi       Cardiovascular hypertension, Pt. on medications Normal cardiovascular exam Rhythm:Regular Rate:Normal     Neuro/Psych  Headaches, Anxiety    GI/Hepatic Neg liver ROS, GERD  Controlled,  Endo/Other  negative endocrine ROS  Renal/GU ESRF and DialysisRenal disease (K+ 3.8, no dialysis yet)     Musculoskeletal   Abdominal (+) + obese,   Peds  Hematology  (+) anemia ,   Anesthesia Other Findings plt 108  Reproductive/Obstetrics                             Anesthesia Physical  Anesthesia Plan  ASA: IV  Anesthesia Plan: General   Post-op Pain Management:    Induction: Intravenous  PONV Risk Score and Plan:   Airway Management Planned: LMA  Additional Equipment:   Intra-op Plan:   Post-operative Plan: Extubation in OR  Informed Consent: I have reviewed the patients History and Physical, chart, labs and discussed the procedure including the risks, benefits and alternatives for the proposed anesthesia with the patient or authorized representative who has indicated his/her understanding and acceptance.   Dental advisory given  Plan Discussed with: CRNA and Surgeon  Anesthesia Plan Comments: (Plan routine monitors, GA- LMA OK)        Anesthesia Quick Evaluation

## 2018-09-14 NOTE — Transfer of Care (Signed)
Immediate Anesthesia Transfer of Care Note  Patient: Steven Best  Procedure(s) Performed: THROMBECTOMY WITH REVISION left arm ARTERIOVENOUS FISTULA (Left Arm Lower)  Patient Location: PACU  Anesthesia Type:General  Level of Consciousness: awake, alert , oriented and patient cooperative  Airway & Oxygen Therapy: Patient Spontanous Breathing  Post-op Assessment: Report given to RN and Post -op Vital signs reviewed and stable  Post vital signs: Reviewed and stable  Last Vitals:  Vitals Value Taken Time  BP    Temp    Pulse 79 09/14/2018 10:30 AM  Resp 16 09/14/2018 10:30 AM  SpO2 100 % 09/14/2018 10:30 AM  Vitals shown include unvalidated device data.  Last Pain:  Vitals:   09/14/18 0649  TempSrc:   PainSc: 3       Patients Stated Pain Goal: 3 (43/15/40 0867)  Complications: No apparent anesthesia complications

## 2018-09-14 NOTE — H&P (Signed)
   Patient name: Steven Best MRN: [id number] DOB: 1966-01-22 Sex: male    HISTORY OF PRESENT ILLNESS:   RASHAWD LASKARIS is a 52 y.o. male with end-stage renal disease who has had a left radiocephalic fistula in place for several years.  It occluded on Tuesday.  He underwent an attempt at percutaneous femoral lysis which was unsuccessful.  He comes in today for a surgical attempt.  CURRENT MEDICATIONS:    Current Facility-Administered Medications  Medication Dose Route Frequency Provider Last Rate Last Dose  . 0.9 %  sodium chloride infusion   Intravenous Continuous Serafina Mitchell, MD      . 0.9 %  sodium chloride infusion   Intravenous Continuous Lynda Rainwater, MD 10 mL/hr at 09/14/18 0703    . ceFAZolin (ANCEF) 3 g in dextrose 5 % 50 mL IVPB  3 g Intravenous To [facility] Preciosa Bundrick, Butch Penny, MD        REVIEW OF SYSTEMS:   [X]  denotes positive finding, [ ]  denotes negative finding Cardiac  Comments:  Chest pain or chest pressure:    Shortness of breath upon exertion:    Short of breath when lying flat:    Irregular heart rhythm:    Constitutional    Fever or chills:      PHYSICAL EXAM:   Vitals:   09/14/18 0640  BP: (!) 103/59  Pulse: 86  Resp: 20  Temp: 97.9 F (36.6 C)  TempSrc: Oral  SpO2: 98%  Weight: 120.2 kg  Height: 6\' 2"  (1.88 m)    GENERAL: The patient is a well-nourished male, in no acute distress. The vital signs are documented above. CARDIOVASCULAR: There is a regular rate and rhythm. PULMONARY: Non-labored respirations No thrill within fistula  STUDIES:   None   MEDICAL ISSUES:   I discussed proceeding with open thrombectomy of his fistula.  He understands that this potentially may not be successful and he will need a new access.  Annamarie Major, MD [facility] [phone] Pager [phone]

## 2018-09-14 NOTE — Anesthesia Procedure Notes (Signed)
Procedure Name: LMA Insertion Date/Time: 09/14/2018 9:26 AM Performed by: Shirlyn Goltz, CRNA Pre-anesthesia Checklist: Patient identified, Emergency Drugs available, Suction available and Patient being monitored Patient Re-evaluated:Patient Re-evaluated prior to induction Oxygen Delivery Method: Circle system utilized Preoxygenation: Pre-oxygenation with 100% oxygen Induction Type: IV induction Ventilation: Mask ventilation without difficulty LMA: LMA with gastric port inserted LMA Size: 5.0 Placement Confirmation: positive ETCO2 and breath sounds checked- equal and bilateral Tube secured with: Tape Dental Injury: Teeth and Oropharynx as per pre-operative assessment

## 2018-09-14 NOTE — Op Note (Signed)
    Patient name: Steven Best MRN: [id number] DOB: 1966-08-28 Sex: male  09/14/2018 Pre-operative Diagnosis: Occluded left radiocephalic fistula Post-operative diagnosis:  Same Surgeon:  Annamarie Major Assistants:  Izetta Dakin Procedure:   Thrombectomy of left radiocephalic fistula Anesthesia: General Blood Loss: 50 cc Specimens: None  Findings: Excellent thrill after thrombectomy  Indications: The patient has a history of a left radiocephalic fistula many years ago.  He underwent an attempt at thrombolysis  2 days prior which was unsuccessful.  He comes in today for surgical attempt.  Procedure:  The patient was identified in the holding area and taken to [place] 10  The patient was then placed supine on the table. general anesthesia was administered.  The patient was prepped and draped in the usual sterile fashion.  A time out was called and antibiotics were administered.  A longitudinal incision was made at the wrist.  Through this incision I dissected out the cephalic vein.  This was a large caliber vein measuring about 8 mm.  It was mobilized proximally distally and encircled with Vesseloops.  5000 he is a heparin was given.  After the heparin circulated, the fistula was occluded with vascular clamps and a ablation make an arteriotomy which was extended transversely with Potts scissors.  There was thrombus appearing within the venotomy.  I passed a #4 Fogarty catheter without resistance on the to the hub.  Thrombectomy was performed in [place] restored backbleeding with removal of the thrombus.  A negative pass was made.  The vein was then occluded with a baby Gregory clamp.  I then passed a #4 and a #3 Fogarty catheter across the arterial venous anastomosis and removed additional thrombus.  After a negative pass was performed there was excellent inflow.  I then closed the venotomy with running 6-0 Prolene.  50 mg of protamine was given.  There was a good thrill within the  fistula.  Once hemostasis was satisfactory, the incision was closed with 2 layers of 3-0 Vicryl followed by Dermabond.  There were no immediate complications.   Disposition: To PACU stable   V. Annamarie Major, M.D. [facility] [place] Office: [phone] Pager:  [phone]

## 2018-09-15 ENCOUNTER — Encounter (HOSPITAL_COMMUNITY): Payer: Self-pay | Admitting: Surgery

## 2018-09-19 ENCOUNTER — Other Ambulatory Visit: Payer: Self-pay

## 2018-09-19 DIAGNOSIS — N186 End stage renal disease: Secondary | ICD-10-CM

## 2018-10-11 ENCOUNTER — Other Ambulatory Visit: Payer: Self-pay | Admitting: Orthopaedic Surgery

## 2018-10-15 ENCOUNTER — Encounter: Payer: Self-pay | Admitting: Surgery

## 2018-10-15 ENCOUNTER — Encounter: Payer: Self-pay | Admitting: *Deleted

## 2018-10-15 ENCOUNTER — Other Ambulatory Visit: Payer: Self-pay

## 2018-10-15 ENCOUNTER — Ambulatory Visit (HOSPITAL_COMMUNITY)
Admission: RE | Admit: 2018-10-15 | Discharge: 2018-10-15 | Disposition: A | Discharge status: Home / Self Care | Payer: BLUE CROSS/BLUE SHIELD | Source: Ambulatory Visit | Attending: Surgery | Admitting: Surgery

## 2018-10-15 ENCOUNTER — Ambulatory Visit (INDEPENDENT_AMBULATORY_CARE_PROVIDER_SITE_OTHER): Payer: Self-pay | Admitting: Surgery

## 2018-10-15 ENCOUNTER — Other Ambulatory Visit: Payer: Self-pay | Admitting: *Deleted

## 2018-10-15 ENCOUNTER — Ambulatory Visit (INDEPENDENT_AMBULATORY_CARE_PROVIDER_SITE_OTHER)
Admission: RE | Admit: 2018-10-15 | Discharge: 2018-10-15 | Disposition: A | Discharge status: Home / Self Care | Payer: BLUE CROSS/BLUE SHIELD | Source: Ambulatory Visit | Attending: Surgery | Admitting: Surgery

## 2018-10-15 VITALS — BP 100/72 | HR 97 | Resp 18 | Ht 74.0 in | Wt 279.0 lb

## 2018-10-15 DIAGNOSIS — N186 End stage renal disease: Secondary | ICD-10-CM

## 2018-10-15 DIAGNOSIS — Z992 Dependence on renal dialysis: Secondary | ICD-10-CM

## 2018-10-15 NOTE — Progress Notes (Signed)
Patient name: Steven Best MRN: [id number] DOB: Sep 08, 1966 Sex: male  REASON FOR VISIT:    Follow-up  HISTORY OF PRESENT ILLNESS:   Steven Best is a 52 y.o. male with a history of a left radiocephalic fistula many years ago.  He had an attempt at thrombolysis at the kidney center which was unsuccessful.  He was taken to the operating room on 1018 and thrombectomy of his fistula was performed.  I was able to get the fistula open.  No obvious etiology for the occlusion was identified.  He was able to use the fistula for several times before it occluded.  He now has a catheter in place.  CURRENT MEDICATIONS:    Current Outpatient Medications  Medication Sig Dispense Refill  . ALPRAZolam (XANAX) 1 MG tablet Take 1 mg by mouth 2 (two) times daily as needed for anxiety.     Lorin Picket 1 GM 210 MG(Fe) tablet Take 630-840 mg by mouth See admin instructions. Take 4 tablets (840 mg) by mouth with each meals & take 3 tablets (630 mg) by mouth with each snacks  3  . cyclobenzaprine (FLEXERIL) 10 MG tablet Take 10 mg by mouth 3 (three) times daily as needed for muscle spasms.     Marland Kitchen ethyl chloride spray Apply 1 application topically daily as needed. To numb site for re sticking if needed.  11  . HYDROcodone-acetaminophen (NORCO) 10-325 MG per tablet Take 2 tablets by mouth every 6 (six) hours as needed for severe pain. 30 tablet 0  . lidocaine-prilocaine (EMLA) cream Apply 1 application topically See admin instructions. Apply a small amount to skin prior to dialysis access 1/2-1 hr prior to each dialysis treatment.  6  . multivitamin (RENA-VIT) TABS tablet Take 1 tablet by mouth daily.    Marland Kitchen rOPINIRole (REQUIP) 1 MG tablet Take 1 mg by mouth at bedtime.  0  . oxyCODONE (ROXICODONE) 5 MG immediate release tablet Take 1 tablet (5 mg total) by mouth every 8 (eight) hours as needed. (Patient not taking: Reported on 10/12/2018) 20 tablet 0  . sodium chloride  (MURO 128) 2 % ophthalmic solution Place 2 drops into both eyes every 4 (four) hours as needed for irritation.     No current facility-administered medications for this visit.     REVIEW OF SYSTEMS:   [X]  denotes positive finding, [ ]  denotes negative finding Cardiac  Comments:  Chest pain or chest pressure:    Shortness of breath upon exertion:    Short of breath when lying flat:    Irregular heart rhythm:    Constitutional    Fever or chills:      PHYSICAL EXAM:   Vitals:   10/15/18 1038  BP: 100/72  Pulse: 97  Resp: 18  SpO2: 100%  Weight: 279 lb (126.6 kg)  Height: 6\' 2"  (1.88 m)    GENERAL: The patient is a well-nourished male, in no acute distress. The vital signs are documented above. CARDIOVASCULAR: There is a regular rate and rhythm. PULMONARY: Non-labored respirations I debrided and removed a portion of a Vicryl stitch within his incision which had slightly opened  STUDIES:   Vein studies were performed today which shows an excellent left cephalic vein   MEDICAL ISSUES:   Patient will be scheduled for a left brachiocephalic fistula on Wednesday, December 4.  The details of the operation were discussed with the patient and his wife.  He understands that he may have to have this elevated at  a later date.  Annamarie Major, MD [facility] [phone] Pager [phone]

## 2018-10-15 NOTE — H&P (View-Only) (Signed)
Patient name: Steven Best MRN: [id number] DOB: [birth year] Sex: male  REASON FOR VISIT:    Follow-up  HISTORY OF PRESENT ILLNESS:   Steven Best is a 52 y.o. male with a history of a left radiocephalic fistula many years ago.  He had an attempt at thrombolysis at the kidney center which was unsuccessful.  He was taken to the operating room on 1018 and thrombectomy of his fistula was performed.  I was able to get the fistula open.  No obvious etiology for the occlusion was identified.  He was able to use the fistula for several times before it occluded.  He now has a catheter in place.  CURRENT MEDICATIONS:    Current Outpatient Medications  Medication Sig Dispense Refill  . ALPRAZolam (XANAX) 1 MG tablet Take 1 mg by mouth 2 (two) times daily as needed for anxiety.     Lorin Picket 1 GM 210 MG(Fe) tablet Take 630-840 mg by mouth See admin instructions. Take 4 tablets (840 mg) by mouth with each meals & take 3 tablets (630 mg) by mouth with each snacks  3  . cyclobenzaprine (FLEXERIL) 10 MG tablet Take 10 mg by mouth 3 (three) times daily as needed for muscle spasms.     Marland Kitchen ethyl chloride spray Apply 1 application topically daily as needed. To numb site for re sticking if needed.  11  . HYDROcodone-acetaminophen (NORCO) 10-325 MG per tablet Take 2 tablets by mouth every 6 (six) hours as needed for severe pain. 30 tablet 0  . lidocaine-prilocaine (EMLA) cream Apply 1 application topically See admin instructions. Apply a small amount to skin prior to dialysis access 1/2-1 hr prior to each dialysis treatment.  6  . multivitamin (RENA-VIT) TABS tablet Take 1 tablet by mouth daily.    Marland Kitchen rOPINIRole (REQUIP) 1 MG tablet Take 1 mg by mouth at bedtime.  0  . oxyCODONE (ROXICODONE) 5 MG immediate release tablet Take 1 tablet (5 mg total) by mouth every 8 (eight) hours as needed. (Patient not taking: Reported on 10/12/2018) 20 tablet 0  . sodium chloride  (MURO 128) 2 % ophthalmic solution Place 2 drops into both eyes every 4 (four) hours as needed for irritation.     No current facility-administered medications for this visit.     REVIEW OF SYSTEMS:   [X]  denotes positive finding, [ ]  denotes negative finding Cardiac  Comments:  Chest pain or chest pressure:    Shortness of breath upon exertion:    Short of breath when lying flat:    Irregular heart rhythm:    Constitutional    Fever or chills:      PHYSICAL EXAM:   Vitals:   10/15/18 1038  BP: 100/72  Pulse: 97  Resp: 18  SpO2: 100%  Weight: 279 lb (126.6 kg)  Height: 6\' 2"  (1.88 m)    GENERAL: The patient is a well-nourished male, in no acute distress. The vital signs are documented above. CARDIOVASCULAR: There is a regular rate and rhythm. PULMONARY: Non-labored respirations I debrided and removed a portion of a Vicryl stitch within his incision which had slightly opened  STUDIES:   Vein studies were performed today which shows an excellent left cephalic vein   MEDICAL ISSUES:   Patient will be scheduled for a left brachiocephalic fistula on Wednesday, December 4.  The details of the operation were discussed with the patient and his wife.  He understands that he may have to have this elevated at  a later date.  Annamarie Major, MD [facility] [phone] Pager [phone]

## 2018-10-17 NOTE — Pre-Procedure Instructions (Signed)
Steven Best  10/17/2018      [id number] - [place], [place] [address] Phone: [phone] Fax: [phone]    Your procedure is scheduled on Tuesday November 26th.  Report to [facility] Admitting at 0730 A.M.  Call this number if you have problems the morning of surgery:  [phone]   Remember:  Do not eat or drink after midnight.   Take these medicines the morning of surgery with A SIP OF WATER   ALPRAZolam (XANAX) if needed  cyclobenzaprine (FLEXERIL) if needed  HYDROcodone-acetaminophen [facility]) if needed    7 days prior to surgery STOP taking any Aspirin(unless otherwise instructed by your surgeon), Aleve, Naproxen, Ibuprofen, Motrin, Advil, Goody's, BC's, all herbal medications, fish oil, and all vitamins   Do not wear jewelry.  Do not wear lotions, powders, or colognes, or deodorant.  Men may shave face and neck.  Do not bring valuables to the hospital.  [facility] is not responsible for any belongings or valuables.  Contacts, dentures or bridgework may not be worn into surgery.  Leave your suitcase in the car.  After surgery it may be brought to your room.  For patients admitted to the hospital, discharge time will be determined by your treatment team.  Patients discharged the day of surgery will not be allowed to drive home.    - Preparing For Surgery  Before surgery, you can play an important role. Because skin is not sterile, your skin needs to be as free of germs as possible. You can reduce the number of germs on your skin by washing with CHG (chlorahexidine gluconate) Soap before surgery.  CHG is an antiseptic cleaner which kills germs and bonds with the skin to continue killing germs even after washing.    Oral Hygiene is also important to reduce your risk of infection.  Remember - BRUSH YOUR TEETH THE MORNING OF SURGERY WITH YOUR REGULAR TOOTHPASTE  Please do not use  if you have an allergy to CHG or antibacterial soaps. If your skin becomes reddened/irritated stop using the CHG.  Do not shave (including legs and underarms) for at least 48 hours prior to first CHG shower. It is OK to shave your face.  Please follow these instructions carefully.   1. Shower the NIGHT BEFORE SURGERY and the MORNING OF SURGERY with CHG.   2. If you chose to wash your hair, wash your hair first as usual with your normal shampoo.  3. After you shampoo, rinse your hair and body thoroughly to remove the shampoo.  4. Use CHG as you would any other liquid soap. You can apply CHG directly to the skin and wash gently with a scrungie or a clean washcloth.   5. Apply the CHG Soap to your body ONLY FROM THE NECK DOWN.  Do not use on open wounds or open sores. Avoid contact with your eyes, ears, mouth and genitals (private parts). Wash Face and genitals (private parts)  with your normal soap.  6. Wash thoroughly, paying special attention to the area where your surgery will be performed.  7. Thoroughly rinse your body with warm water from the neck down.  8. DO NOT shower/wash with your normal soap after using and rinsing off the CHG Soap.  9. Pat yourself dry with a CLEAN TOWEL.  10. Wear CLEAN PAJAMAS to bed the night before surgery, wear comfortable clothes the morning of surgery  11. Place CLEAN  SHEETS on your bed the night of your first shower and DO NOT SLEEP WITH PETS.    Day of Surgery:  Do not apply any deodorants/lotions.  Please wear clean clothes to the hospital/surgery center.   Remember to brush your teeth WITH YOUR REGULAR TOOTHPASTE.    Please read over the following fact sheets that you were given.

## 2018-10-18 ENCOUNTER — Other Ambulatory Visit: Payer: Self-pay

## 2018-10-18 ENCOUNTER — Encounter (HOSPITAL_COMMUNITY): Payer: Self-pay

## 2018-10-18 ENCOUNTER — Encounter (HOSPITAL_COMMUNITY)
Admission: RE | Admit: 2018-10-18 | Discharge: 2018-10-18 | Disposition: A | Discharge status: Home / Self Care | Payer: Medicare Other | Source: Ambulatory Visit | Attending: Orthopaedic Surgery | Admitting: Orthopaedic Surgery

## 2018-10-18 DIAGNOSIS — S83281A Other tear of lateral meniscus, current injury, right knee, initial encounter: Secondary | ICD-10-CM | POA: Insufficient documentation

## 2018-10-18 DIAGNOSIS — D869 Sarcoidosis, unspecified: Secondary | ICD-10-CM | POA: Insufficient documentation

## 2018-10-18 DIAGNOSIS — F419 Anxiety disorder, unspecified: Secondary | ICD-10-CM | POA: Insufficient documentation

## 2018-10-18 DIAGNOSIS — N186 End stage renal disease: Secondary | ICD-10-CM | POA: Diagnosis not present

## 2018-10-18 DIAGNOSIS — Z992 Dependence on renal dialysis: Secondary | ICD-10-CM | POA: Insufficient documentation

## 2018-10-18 DIAGNOSIS — Z79899 Other long term (current) drug therapy: Secondary | ICD-10-CM | POA: Diagnosis not present

## 2018-10-18 DIAGNOSIS — S83241A Other tear of medial meniscus, current injury, right knee, initial encounter: Secondary | ICD-10-CM | POA: Diagnosis not present

## 2018-10-18 DIAGNOSIS — K7460 Unspecified cirrhosis of liver: Secondary | ICD-10-CM | POA: Insufficient documentation

## 2018-10-18 DIAGNOSIS — I120 Hypertensive chronic kidney disease with stage 5 chronic kidney disease or end stage renal disease: Secondary | ICD-10-CM | POA: Insufficient documentation

## 2018-10-18 DIAGNOSIS — Z9049 Acquired absence of other specified parts of digestive tract: Secondary | ICD-10-CM | POA: Diagnosis not present

## 2018-10-18 DIAGNOSIS — X58XXXA Exposure to other specified factors, initial encounter: Secondary | ICD-10-CM | POA: Diagnosis not present

## 2018-10-18 DIAGNOSIS — Z8781 Personal history of (healed) traumatic fracture: Secondary | ICD-10-CM | POA: Insufficient documentation

## 2018-10-18 DIAGNOSIS — Z87891 Personal history of nicotine dependence: Secondary | ICD-10-CM | POA: Insufficient documentation

## 2018-10-18 DIAGNOSIS — Z01818 Encounter for other preprocedural examination: Secondary | ICD-10-CM | POA: Insufficient documentation

## 2018-10-18 DIAGNOSIS — D696 Thrombocytopenia, unspecified: Secondary | ICD-10-CM | POA: Insufficient documentation

## 2018-10-18 DIAGNOSIS — K219 Gastro-esophageal reflux disease without esophagitis: Secondary | ICD-10-CM | POA: Insufficient documentation

## 2018-10-18 HISTORY — DX: Sarcoidosis, unspecified: D86.9
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
## 2018-10-18 NOTE — Progress Notes (Signed)
   10/18/18 0826  OBSTRUCTIVE SLEEP APNEA  Have you ever been diagnosed with sleep apnea through a sleep study? No  Do you snore loudly (loud enough to be heard through closed doors)?  1  Do you often feel tired, fatigued, or sleepy during the daytime (such as falling asleep during driving or talking to someone)? 0  Has anyone observed you stop breathing during your sleep? 0  Do you have, or are you being treated for high blood pressure? 1  BMI more than 35 kg/m2? 1  Age > 50 (1-yes) 1  Neck circumference greater than:Male 16 inches or larger, Male 17inches or larger? 1  Male Gender (Yes=1) 1  Obstructive Sleep Apnea Score 6  Score 5 or greater  Results sent to PCP

## 2018-10-18 NOTE — Progress Notes (Signed)
PCP - Shirline Frees MD  Chest x-ray - 07/17/18 EKG - 07/21/18 Stress Test -  > 5 years ago ECHO - > 5 years ago  Pt on Dialysis T, Th, Sat. States he will schedule dialysis on Monday Nov 25th. ISTAT for DOS  Blood Thinner Instructions: N/A Aspirin Instructions:N/A  Anesthesia review: none  Patient denies shortness of breath, fever, cough and chest pain at PAT appointment   Patient verbalized understanding of instructions that were given to them at the PAT appointment. Patient was also instructed that they will need to review over the PAT instructions again at home before surgery.

## 2018-10-22 MED ORDER — DEXTROSE 5 % IV SOLN
3.0000 g | INTRAVENOUS | Status: DC
  Filled 2018-10-22: qty 3000
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
## 2018-10-23 ENCOUNTER — Encounter (HOSPITAL_COMMUNITY)
Admission: RE | Disposition: A | Discharge status: Home / Self Care | Payer: Self-pay | Source: Ambulatory Visit | Attending: Orthopaedic Surgery

## 2018-10-23 ENCOUNTER — Ambulatory Visit (HOSPITAL_COMMUNITY)
Admission: RE | Admit: 2018-10-23 | Discharge: 2018-10-23 | Disposition: A | Discharge status: Home / Self Care | Payer: BLUE CROSS/BLUE SHIELD | Source: Ambulatory Visit | Attending: Orthopaedic Surgery | Admitting: Orthopaedic Surgery

## 2018-10-23 ENCOUNTER — Encounter (HOSPITAL_COMMUNITY): Payer: Self-pay

## 2018-10-23 ENCOUNTER — Ambulatory Visit (HOSPITAL_COMMUNITY): Payer: BLUE CROSS/BLUE SHIELD | Admitting: Certified Registered Nurse Anesthetist

## 2018-10-23 ENCOUNTER — Ambulatory Visit (HOSPITAL_COMMUNITY): Payer: BLUE CROSS/BLUE SHIELD | Admitting: Vascular Surgery

## 2018-10-23 ENCOUNTER — Other Ambulatory Visit: Payer: Self-pay

## 2018-10-23 DIAGNOSIS — X58XXXA Exposure to other specified factors, initial encounter: Secondary | ICD-10-CM | POA: Diagnosis not present

## 2018-10-23 DIAGNOSIS — K219 Gastro-esophageal reflux disease without esophagitis: Secondary | ICD-10-CM | POA: Diagnosis not present

## 2018-10-23 DIAGNOSIS — S83281A Other tear of lateral meniscus, current injury, right knee, initial encounter: Secondary | ICD-10-CM | POA: Diagnosis not present

## 2018-10-23 DIAGNOSIS — S83241A Other tear of medial meniscus, current injury, right knee, initial encounter: Secondary | ICD-10-CM | POA: Insufficient documentation

## 2018-10-23 DIAGNOSIS — I120 Hypertensive chronic kidney disease with stage 5 chronic kidney disease or end stage renal disease: Secondary | ICD-10-CM | POA: Diagnosis not present

## 2018-10-23 DIAGNOSIS — M94261 Chondromalacia, right knee: Secondary | ICD-10-CM | POA: Insufficient documentation

## 2018-10-23 DIAGNOSIS — Z87891 Personal history of nicotine dependence: Secondary | ICD-10-CM | POA: Diagnosis not present

## 2018-10-23 DIAGNOSIS — Z992 Dependence on renal dialysis: Secondary | ICD-10-CM | POA: Insufficient documentation

## 2018-10-23 DIAGNOSIS — N186 End stage renal disease: Secondary | ICD-10-CM | POA: Diagnosis not present

## 2018-10-23 DIAGNOSIS — F419 Anxiety disorder, unspecified: Secondary | ICD-10-CM | POA: Diagnosis not present

## 2018-10-23 HISTORY — PX: KNEE ARTHROSCOPY: SHX127

## 2018-10-23 HISTORY — PX: KNEE ARTHROSCOPY WITH LATERAL MENISECTOMY: SHX6193

## 2018-10-23 HISTORY — PX: CHONDROPLASTY: SHX5177

## 2018-10-23 HISTORY — PX: KNEE ARTHROSCOPY WITH MEDIAL MENISECTOMY: SHX5651

## 2018-10-23 LAB — PROTIME-INR
INR: 1.04
Prothrombin Time: 13.5 seconds (ref 11.4–15.2)

## 2018-10-23 LAB — CBC
HCT: 36.3 % — ABNORMAL LOW (ref 39.0–52.0)
Hemoglobin: 11.8 g/dL — ABNORMAL LOW (ref 13.0–17.0)
MCH: 32.2 pg (ref 26.0–34.0)
MCHC: 32.5 g/dL (ref 30.0–36.0)
MCV: 98.9 fL (ref 80.0–100.0)
PLATELETS: 147 10*3/uL — AB (ref 150–400)
RBC: 3.67 MIL/uL — ABNORMAL LOW (ref 4.22–5.81)
RDW: 12.6 % (ref 11.5–15.5)
WBC: 4.9 10*3/uL (ref 4.0–10.5)
nRBC: 0 % (ref 0.0–0.2)

## 2018-10-23 LAB — COMPREHENSIVE METABOLIC PANEL
ALBUMIN: 4 g/dL (ref 3.5–5.0)
ALT: 17 U/L (ref 0–44)
AST: 22 U/L (ref 15–41)
Alkaline Phosphatase: 78 U/L (ref 38–126)
Anion gap: 10 (ref 5–15)
BUN: 25 mg/dL — ABNORMAL HIGH (ref 6–20)
CHLORIDE: 105 mmol/L (ref 98–111)
CO2: 26 mmol/L (ref 22–32)
CREATININE: 4.99 mg/dL — AB (ref 0.61–1.24)
Calcium: 10 mg/dL (ref 8.9–10.3)
GFR calc Af Amer: 14 mL/min — ABNORMAL LOW (ref >60)
GFR calc non Af Amer: 12 mL/min — ABNORMAL LOW (ref >60)
Glucose, Bld: 112 mg/dL — ABNORMAL HIGH (ref 70–99)
Potassium: 3.5 mmol/L (ref 3.5–5.1)
SODIUM: 141 mmol/L (ref 135–145)
Total Bilirubin: 0.7 mg/dL (ref 0.3–1.2)
Total Protein: 7.5 g/dL (ref 6.5–8.1)

## 2018-10-23 LAB — APTT: APTT: 33 s (ref 24–36)

## 2018-10-23 SURGERY — ARTHROSCOPY, KNEE
Anesthesia: General | Site: Knee | Laterality: Right

## 2018-10-23 MED ORDER — SODIUM CHLORIDE 0.9 % IV SOLN
INTRAVENOUS | Status: DC | PRN
  Administered 2018-10-23: 09:00:00 via INTRAVENOUS

## 2018-10-23 MED ORDER — DEXAMETHASONE SODIUM PHOSPHATE 10 MG/ML IJ SOLN
INTRAMUSCULAR | Status: DC | PRN
  Administered 2018-10-23: 10 mg via INTRAVENOUS

## 2018-10-23 MED ORDER — HYDROCODONE-ACETAMINOPHEN 10-325 MG PO TABS: 1.0000 | ORAL_TABLET | Freq: Four times a day (QID) | ORAL | 0 refills | Status: DC | PRN

## 2018-10-23 MED ORDER — FENTANYL CITRATE (PF) 100 MCG/2ML IJ SOLN: 100.0000 ug | Freq: Once | INTRAMUSCULAR | Status: DC

## 2018-10-23 MED ORDER — DEXAMETHASONE SODIUM PHOSPHATE 10 MG/ML IJ SOLN
INTRAMUSCULAR | Status: AC
  Filled 2018-10-23: qty 1

## 2018-10-23 MED ORDER — MIDAZOLAM HCL 2 MG/2ML IJ SOLN: 2.0000 mg | Freq: Once | INTRAMUSCULAR | Status: DC

## 2018-10-23 MED ORDER — BUPIVACAINE-EPINEPHRINE (PF) 0.25% -1:200000 IJ SOLN
INTRAMUSCULAR | Status: DC | PRN
  Administered 2018-10-23: 15 mL

## 2018-10-23 MED ORDER — CEFAZOLIN SODIUM-DEXTROSE 2-3 GM-%(50ML) IV SOLR
INTRAVENOUS | Status: DC | PRN
  Administered 2018-10-23: 3 g via INTRAVENOUS

## 2018-10-23 MED ORDER — ONDANSETRON HCL 4 MG/2ML IJ SOLN: 4.0000 mg | Freq: Once | INTRAMUSCULAR | Status: DC | PRN

## 2018-10-23 MED ORDER — STERILE WATER FOR IRRIGATION IR SOLN
Status: DC | PRN
  Administered 2018-10-23: 1000 mL

## 2018-10-23 MED ORDER — OXYCODONE HCL 5 MG/5ML PO SOLN: 5.0000 mg | Freq: Once | ORAL | Status: AC | PRN

## 2018-10-23 MED ORDER — MIDAZOLAM HCL 2 MG/2ML IJ SOLN
INTRAMUSCULAR | Status: DC | PRN
  Administered 2018-10-23: 2 mg via INTRAVENOUS

## 2018-10-23 MED ORDER — 0.9 % SODIUM CHLORIDE (POUR BTL) OPTIME
TOPICAL | Status: DC | PRN
  Administered 2018-10-23: 1000 mL

## 2018-10-23 MED ORDER — LIDOCAINE 2% (20 MG/ML) 5 ML SYRINGE
INTRAMUSCULAR | Status: AC
  Filled 2018-10-23: qty 5

## 2018-10-23 MED ORDER — BUPIVACAINE-EPINEPHRINE 0.25% -1:200000 IJ SOLN
INTRAMUSCULAR | Status: AC
  Filled 2018-10-23: qty 1

## 2018-10-23 MED ORDER — GLYCOPYRROLATE PF 0.2 MG/ML IJ SOSY
PREFILLED_SYRINGE | INTRAMUSCULAR | Status: AC
  Filled 2018-10-23: qty 1

## 2018-10-23 MED ORDER — METHYLPREDNISOLONE ACETATE 80 MG/ML IJ SUSP
INTRAMUSCULAR | Status: AC
  Filled 2018-10-23: qty 1

## 2018-10-23 MED ORDER — OXYCODONE HCL 5 MG PO TABS
5.0000 mg | ORAL_TABLET | Freq: Once | ORAL | Status: AC | PRN
  Administered 2018-10-23: 5 mg via ORAL

## 2018-10-23 MED ORDER — FENTANYL CITRATE (PF) 250 MCG/5ML IJ SOLN
INTRAMUSCULAR | Status: DC | PRN
  Administered 2018-10-23: 25 ug via INTRAVENOUS
  Administered 2018-10-23: 150 ug via INTRAVENOUS
  Administered 2018-10-23 (×3): 25 ug via INTRAVENOUS

## 2018-10-23 MED ORDER — OXYCODONE HCL 5 MG PO TABS
ORAL_TABLET | ORAL | Status: AC
  Filled 2018-10-23: qty 1

## 2018-10-23 MED ORDER — FENTANYL CITRATE (PF) 250 MCG/5ML IJ SOLN
INTRAMUSCULAR | Status: AC
  Filled 2018-10-23: qty 5

## 2018-10-23 MED ORDER — ONDANSETRON HCL 4 MG/2ML IJ SOLN
INTRAMUSCULAR | Status: DC | PRN
  Administered 2018-10-23: 4 mg via INTRAVENOUS

## 2018-10-23 MED ORDER — CHLORHEXIDINE GLUCONATE 4 % EX LIQD: 60.0000 mL | Freq: Once | CUTANEOUS | Status: DC

## 2018-10-23 MED ORDER — MIDAZOLAM HCL 2 MG/2ML IJ SOLN
INTRAMUSCULAR | Status: AC
  Filled 2018-10-23: qty 2

## 2018-10-23 MED ORDER — PHENYLEPHRINE HCL 10 MG/ML IJ SOLN
INTRAMUSCULAR | Status: DC | PRN
  Administered 2018-10-23: 40 ug via INTRAVENOUS
  Administered 2018-10-23: 80 ug via INTRAVENOUS

## 2018-10-23 MED ORDER — PHENYLEPHRINE 40 MCG/ML (10ML) SYRINGE FOR IV PUSH (FOR BLOOD PRESSURE SUPPORT)
PREFILLED_SYRINGE | INTRAVENOUS | Status: AC
  Filled 2018-10-23: qty 10

## 2018-10-23 MED ORDER — DEXAMETHASONE SODIUM PHOSPHATE 10 MG/ML IJ SOLN: INTRAMUSCULAR | Status: DC | PRN

## 2018-10-23 MED ORDER — SODIUM CHLORIDE 0.9 % IR SOLN
Status: DC | PRN
  Administered 2018-10-23: 6000 mL

## 2018-10-23 MED ORDER — LIDOCAINE HCL (CARDIAC) PF 100 MG/5ML IV SOSY
PREFILLED_SYRINGE | INTRAVENOUS | Status: DC | PRN
  Administered 2018-10-23: 80 mg via INTRATRACHEAL

## 2018-10-23 MED ORDER — PROPOFOL 10 MG/ML IV BOLUS
INTRAVENOUS | Status: DC | PRN
  Administered 2018-10-23: 200 mg via INTRAVENOUS

## 2018-10-23 MED ORDER — ONDANSETRON HCL 4 MG/2ML IJ SOLN
INTRAMUSCULAR | Status: AC
  Filled 2018-10-23: qty 2

## 2018-10-23 MED ORDER — FENTANYL CITRATE (PF) 100 MCG/2ML IJ SOLN
INTRAMUSCULAR | Status: AC
  Administered 2018-10-23: 50 ug via INTRAVENOUS
  Filled 2018-10-23: qty 2

## 2018-10-23 MED ORDER — FENTANYL CITRATE (PF) 100 MCG/2ML IJ SOLN
25.0000 ug | INTRAMUSCULAR | Status: DC | PRN
  Administered 2018-10-23 (×2): 50 ug via INTRAVENOUS

## 2018-10-23 SURGICAL SUPPLY — 37 items
BANDAGE ACE 6X5 VEL STRL LF (GAUZE/BANDAGES/DRESSINGS) ×2 IMPLANT
BANDAGE ELASTIC 6 VELCRO ST LF (GAUZE/BANDAGES/DRESSINGS) ×2 IMPLANT
BLADE GREAT WHITE 4.2 (BLADE) ×4 IMPLANT
BLADE SURG 11 STRL SS (BLADE) IMPLANT
BNDG GAUZE ELAST 4 BULKY (GAUZE/BANDAGES/DRESSINGS) ×2 IMPLANT
COVER SURGICAL LIGHT HANDLE (MISCELLANEOUS) ×2 IMPLANT
COVER WAND RF STERILE (DRAPES) ×2 IMPLANT
CUFF TOURNIQUET SINGLE 34IN LL (TOURNIQUET CUFF) IMPLANT
CUFF TOURNIQUET SINGLE 44IN (TOURNIQUET CUFF) IMPLANT
DRAPE ARTHROSCOPY W/POUCH 114 (DRAPES) ×2 IMPLANT
DRAPE U-SHAPE 47X51 STRL (DRAPES) ×2 IMPLANT
DRSG EMULSION OIL 3X3 NADH (GAUZE/BANDAGES/DRESSINGS) ×2 IMPLANT
DRSG PAD ABDOMINAL 8X10 ST (GAUZE/BANDAGES/DRESSINGS) ×2 IMPLANT
DURAPREP 26ML APPLICATOR (WOUND CARE) ×2 IMPLANT
GAUZE SPONGE 4X4 12PLY STRL (GAUZE/BANDAGES/DRESSINGS) ×2 IMPLANT
GAUZE SPONGE 4X4 12PLY STRL LF (GAUZE/BANDAGES/DRESSINGS) ×2 IMPLANT
GLOVE BIO SURGEON STRL SZ8 (GLOVE) ×8 IMPLANT
GLOVE BIOGEL PI IND STRL 8 (GLOVE) ×1 IMPLANT
GLOVE BIOGEL PI INDICATOR 8 (GLOVE) ×1
GOWN STRL REUS W/ TWL LRG LVL3 (GOWN DISPOSABLE) ×3 IMPLANT
GOWN STRL REUS W/ TWL XL LVL3 (GOWN DISPOSABLE) ×3 IMPLANT
GOWN STRL REUS W/TWL 2XL LVL3 (GOWN DISPOSABLE) ×2 IMPLANT
GOWN STRL REUS W/TWL LRG LVL3 (GOWN DISPOSABLE) ×3
GOWN STRL REUS W/TWL XL LVL3 (GOWN DISPOSABLE) ×3
KIT TURNOVER KIT B (KITS) ×2 IMPLANT
MANIFOLD NEPTUNE II (INSTRUMENTS) ×2 IMPLANT
NEEDLE 18GX1X1/2 (RX/OR ONLY) (NEEDLE) ×2 IMPLANT
NEEDLE 22X1 1/2 (OR ONLY) (NEEDLE) IMPLANT
NEEDLE SPNL 18GX3.5 QUINCKE PK (NEEDLE) IMPLANT
PACK ARTHROSCOPY DSU (CUSTOM PROCEDURE TRAY) ×2 IMPLANT
PAD ABD 8X10 STRL (GAUZE/BANDAGES/DRESSINGS) ×4 IMPLANT
PAD ARMBOARD 7.5X6 YLW CONV (MISCELLANEOUS) ×4 IMPLANT
SET ARTHROSCOPY TUBING (MISCELLANEOUS) ×1
SET ARTHROSCOPY TUBING LN (MISCELLANEOUS) ×1 IMPLANT
SYR CONTROL 10ML LL (SYRINGE) ×2 IMPLANT
TOWEL OR 17X24 6PK STRL BLUE (TOWEL DISPOSABLE) ×4 IMPLANT
WATER STERILE IRR 1000ML POUR (IV SOLUTION) ×2 IMPLANT

## 2018-10-23 NOTE — Anesthesia Preprocedure Evaluation (Addendum)
Anesthesia Evaluation  Patient identified by MRN, date of birth, ID band Patient awake    Reviewed: Allergy & Precautions, NPO status , Patient's Chart, lab work & pertinent test results  History of Anesthesia Complications (+) PROLONGED EMERGENCE and history of anesthetic complications  Airway Mallampati: II  TM Distance: >3 FB Neck ROM: Full    Dental  (+) Chipped   Pulmonary pneumonia, resolved, former smoker,    Pulmonary exam normal breath sounds clear to auscultation       Cardiovascular hypertension, Pt. on medications Normal cardiovascular exam Rhythm:Regular Rate:Normal     Neuro/Psych  Headaches, PSYCHIATRIC DISORDERS Anxiety    GI/Hepatic Neg liver ROS, GERD  Medicated and Controlled,  Endo/Other  Obesity  Renal/GU ESRF and DialysisRenal diseaseLast dialysis yesterday  negative genitourinary   Musculoskeletal negative musculoskeletal ROS (+)   Abdominal (+) + obese,   Peds  Hematology  (+) anemia , Thrombocytopenia- mild   Anesthesia Other Findings   Reproductive/Obstetrics                             Anesthesia Physical Anesthesia Plan  ASA: IV  Anesthesia Plan: General   Post-op Pain Management:    Induction: Intravenous  PONV Risk Score and Plan: 3 and Ondansetron, Dexamethasone, Midazolam and Treatment may vary due to age or medical condition  Airway Management Planned: LMA  Additional Equipment:   Intra-op Plan:   Post-operative Plan: Extubation in OR  Informed Consent: I have reviewed the patients History and Physical, chart, labs and discussed the procedure including the risks, benefits and alternatives for the proposed anesthesia with the patient or authorized representative who has indicated his/her understanding and acceptance.   Dental advisory given  Plan Discussed with: CRNA and Surgeon  Anesthesia Plan Comments:         Anesthesia Quick  Evaluation

## 2018-10-23 NOTE — Anesthesia Postprocedure Evaluation (Signed)
Anesthesia Post Note  Patient: Steven Best  Procedure(s) Performed: ARTHROSCOPY KNEE (Right Knee) KNEE ARTHROSCOPY WITH LATERAL MENISECTOMY (Right Knee) KNEE ARTHROSCOPY WITH MEDIAL MENISECTOMY (Right Knee) CHONDROPLASTY (Right Knee)     Patient location during evaluation: PACU Anesthesia Type: General Level of consciousness: awake and alert and oriented Pain management: pain level controlled Vital Signs Assessment: post-procedure vital signs reviewed and stable Respiratory status: spontaneous breathing, nonlabored ventilation and respiratory function stable Cardiovascular status: blood pressure returned to baseline and stable Postop Assessment: no apparent nausea or vomiting Anesthetic complications: no    Last Vitals:  Vitals:   10/23/18 1034 10/23/18 1047  BP: (!) 116/58 (!) 130/95  Pulse: 74 74  Resp: 15 18  Temp: 36.5 C   SpO2: 100% 100%    Last Pain:  Vitals:   10/23/18 1047  PainSc: 2                  Requan Hardge A.

## 2018-10-23 NOTE — Interval H&P Note (Signed)
History and Physical Interval Note:  10/23/2018 8:35 AM  Steven Best  has presented today for surgery, with the diagnosis of RIGHT KNEE MEDIAL MENISCAL TEAR, RIGHT KNEE LATERAL MENISCAL TEAR, AND CHONDROMALACIA  The various methods of treatment have been discussed with the patient and family. After consideration of risks, benefits and other options for treatment, the patient has consented to  Procedure(s): ARTHROSCOPY KNEE (Right) as a surgical intervention .  The patient's history has been reviewed, patient examined, no change in status, stable for surgery.  I have reviewed the patient's chart and labs.  Questions were answered to the patient's satisfaction.     Jadwiga Faidley G

## 2018-10-23 NOTE — Op Note (Signed)
NAME: Steven Best, Steven Best MEDICAL RECORD OA:[id number] ACCOUNT [id number] DATE OF BIRTH:[birth year] FACILITY: [facility] LOCATION: [place], [place]  OPERATIVE REPORT  DATE OF PROCEDURE:  10/23/2018  PREOPERATIVE DIAGNOSES: 1.  Right knee torn medial and torn lateral meniscus. 2.  Right knee chondromalacia.  POSTOPERATIVE DIAGNOSES:   1.  Right knee torn medial and torn lateral meniscus. 2.  Right knee chondromalacia.  PROCEDURE: 1.  Right knee partial medial partial lateral meniscectomy. 2.  Right knee abrasion chondroplasty of patellofemoral.  ANESTHESIA:  General.  ATTENDING SURGEON:  Melrose Nakayama, MD  ASSISTANT:  Loni Dolly, PA.  INDICATIONS:  The patient is a 52 year old man with many months of intense right knee pain and swelling.  This has persisted despite several aspirations, injections, and activity modification.  By MRI scan, he has medial and lateral meniscus tears and  some mild to moderate degenerative change.  He has pain which limits his ability to rest and walk and work.  He is offered an arthroscopy.  He had a similar procedure about 20 years back.  He is a dialysis patient and we have offered him the procedure in  the main operating room as a consequence.  SUMMARY OF FINDINGS:  Under general anesthesia, a right knee arthroscopy was performed.  Medial compartment showed evidence of a prior partial medial meniscectomy, but also a new tear in the middle and posterior horn.  About a 10% partial medial  meniscectomy was performed.  He had no degenerative changes in this compartment.  ACL appeared intact.  Lateral compartment had a displaceable posterior horn tear, which was stuck in the notch.  About a 15% partial lateral meniscectomy was done back to  stable tissue.  He had no degenerative changes in this compartment.  The patellofemoral portion of the exhibit some focal breakdown to the intertrochlear groove and a chondroplasty was done along  with abrasion of bleeding bone and some small areas.  The  patient was scheduled to go home same day.  DESCRIPTION OF PROCEDURE:  The patient was taken to the operating suite where general anesthetic was applied without difficulty.  He was positioned supine and prepped and draped in normal sterile fashion.  After the administration of preoperative IV  Kefzol and appropriate time-out, and arthroscopy of the right knee was performed through a total of 2 portals.  Findings were as noted above and procedure consisted most significantly of the partial medial and partial lateral meniscectomies, which were  done with baskets and shavers.  In the lateral compartment, he was left with some good tissue in the posterior horn anterior to the popliteal tendon.  We also performed an abrasion chondroplasty as described above.  The knee was thoroughly irrigated  followed by placement of Marcaine with epinephrine and morphine.  Adaptic was placed over the portals followed by dry gauze and a loose Ace wrap.  ESTIMATED BLOOD LOSS AND FLUIDS:   Obtained from anesthesia records.  DISPOSITION:  The patient was extubated in the operating room and taken to recovery room in stable condition.  He was scheduled to go home same day and follow up in the office in less than a week.  I will contact him by phone tonight.  TN/NUANCE  D:10/23/2018 T:10/23/2018 JOB:[id number]

## 2018-10-23 NOTE — Brief Op Note (Signed)
GODFREY TRITSCHLER [id number] 10/23/2018   PRE-OP DIAGNOSIS: right knee TMM and TLM and CP  POST-OP DIAGNOSIS: same  PROCEDURE: right knee scope  ANESTHESIA: general  Deeandra Jerry G   Dictation #:  [id number]

## 2018-10-23 NOTE — Anesthesia Procedure Notes (Signed)
Procedure Name: LMA Insertion Date/Time: 10/23/2018 9:17 AM Performed by: Lavell Luster, CRNA Pre-anesthesia Checklist: Patient identified, Emergency Drugs available, Suction available, Patient being monitored and Timeout performed Patient Re-evaluated:Patient Re-evaluated prior to induction Oxygen Delivery Method: Circle system utilized Preoxygenation: Pre-oxygenation with 100% oxygen Induction Type: IV induction Ventilation: Mask ventilation without difficulty LMA: LMA with gastric port inserted LMA Size: 5.0 Number of attempts: 1 Placement Confirmation: positive ETCO2 and breath sounds checked- equal and bilateral Tube secured with: Tape Dental Injury: Teeth and Oropharynx as per pre-operative assessment

## 2018-10-23 NOTE — Transfer of Care (Signed)
Immediate Anesthesia Transfer of Care Note  Patient: Steven Best  Procedure(s) Performed: ARTHROSCOPY KNEE (Right Knee) KNEE ARTHROSCOPY WITH LATERAL MENISECTOMY (Right Knee) KNEE ARTHROSCOPY WITH MEDIAL MENISECTOMY (Right Knee) CHONDROPLASTY (Right Knee)  Patient Location: PACU  Anesthesia Type:General  Level of Consciousness: awake, alert , oriented and patient cooperative  Airway & Oxygen Therapy: Patient Spontanous Breathing and Patient connected to face mask oxygen  Post-op Assessment: Report given to RN, Post -op Vital signs reviewed and stable and Patient moving all extremities  Post vital signs: Reviewed and stable  Last Vitals:  Vitals Value Taken Time  BP 125/90 10/23/2018 10:00 AM  Temp    Pulse 83 10/23/2018 10:00 AM  Resp 22 10/23/2018 10:00 AM  SpO2 100 % 10/23/2018 10:00 AM  Vitals shown include unvalidated device data.  Last Pain:  Vitals:   10/23/18 0749  PainSc: 7       Patients Stated Pain Goal: 3 (66/59/93 5701)  Complications: No apparent anesthesia complications

## 2018-10-23 NOTE — Progress Notes (Signed)
Orthopedic Tech Progress Note Patient Details:  Steven Best 1966/07/24 [id number]  Ortho Devices Type of Ortho Device: Crutches Ortho Device/Splint Interventions: Adjustment   Post Interventions Patient Tolerated: Well Instructions Provided: Poper ambulation with device, Care of device, Adjustment of device   [name] 10/23/2018, 11:11 AM

## 2018-10-23 NOTE — Discharge Instructions (Signed)

## 2018-10-24 ENCOUNTER — Encounter (HOSPITAL_COMMUNITY): Payer: Self-pay | Admitting: Orthopaedic Surgery

## 2018-10-30 ENCOUNTER — Encounter (HOSPITAL_COMMUNITY): Payer: Self-pay | Admitting: *Deleted

## 2018-10-30 ENCOUNTER — Other Ambulatory Visit: Payer: Self-pay

## 2018-10-30 MED ORDER — DEXTROSE 5 % IV SOLN
3.0000 g | INTRAVENOUS | Status: AC
  Administered 2018-10-31: 3 g via INTRAVENOUS
  Filled 2018-10-30: qty 3

## 2018-10-30 NOTE — Progress Notes (Signed)
   10/30/18 1707  OBSTRUCTIVE SLEEP APNEA  Have you ever been diagnosed with sleep apnea through a sleep study? No  Do you snore loudly (loud enough to be heard through closed doors)?  1  Do you often feel tired, fatigued, or sleepy during the daytime (such as falling asleep during driving or talking to someone)? 0  Has anyone observed you stop breathing during your sleep? 0  Do you have, or are you being treated for high blood pressure? 1  BMI more than 35 kg/m2? 1  Age > 50 (1-yes) 1  Neck circumference greater than:Male 16 inches or larger, Male 17inches or larger? 1  Male Gender (Yes=1) 1  Obstructive Sleep Apnea Score 6

## 2018-10-30 NOTE — Progress Notes (Signed)
Pt denies SOB, chest pain, and being under the care of a cardiologist. Pt denies having a cardiac cath but stated that a stress test was performed > 5 years ago. Pt made aware to stop taking vitamins, fish oil and herbal medications. Do not take any NSAIDs ie: Ibuprofen, Advil, Naproxen (Aleve), Motrin, BC and Goody Powder. Pt verbalized understanding of all pre-op instructions.  Pt has history of anesthesia complication. See Previous anesthesia note dated 09/13/18.

## 2018-10-31 ENCOUNTER — Ambulatory Visit (HOSPITAL_COMMUNITY)
Admission: RE | Admit: 2018-10-31 | Discharge: 2018-10-31 | Disposition: A | Discharge status: Home / Self Care | Payer: BLUE CROSS/BLUE SHIELD | Source: Ambulatory Visit | Attending: Surgery | Admitting: Surgery

## 2018-10-31 ENCOUNTER — Ambulatory Visit (HOSPITAL_COMMUNITY): Payer: BLUE CROSS/BLUE SHIELD | Admitting: Certified Registered Nurse Anesthetist

## 2018-10-31 ENCOUNTER — Encounter (HOSPITAL_COMMUNITY)
Admission: RE | Disposition: A | Discharge status: Home / Self Care | Payer: Self-pay | Source: Ambulatory Visit | Attending: Surgery

## 2018-10-31 ENCOUNTER — Encounter (HOSPITAL_COMMUNITY): Payer: Self-pay | Admitting: *Deleted

## 2018-10-31 DIAGNOSIS — Z6835 Body mass index (BMI) 35.0-35.9, adult: Secondary | ICD-10-CM | POA: Diagnosis not present

## 2018-10-31 DIAGNOSIS — Z992 Dependence on renal dialysis: Secondary | ICD-10-CM | POA: Insufficient documentation

## 2018-10-31 DIAGNOSIS — Z79899 Other long term (current) drug therapy: Secondary | ICD-10-CM | POA: Insufficient documentation

## 2018-10-31 DIAGNOSIS — F419 Anxiety disorder, unspecified: Secondary | ICD-10-CM | POA: Diagnosis not present

## 2018-10-31 DIAGNOSIS — N186 End stage renal disease: Secondary | ICD-10-CM | POA: Diagnosis present

## 2018-10-31 DIAGNOSIS — N185 Chronic kidney disease, stage 5: Secondary | ICD-10-CM

## 2018-10-31 DIAGNOSIS — I120 Hypertensive chronic kidney disease with stage 5 chronic kidney disease or end stage renal disease: Secondary | ICD-10-CM | POA: Diagnosis not present

## 2018-10-31 DIAGNOSIS — Z87891 Personal history of nicotine dependence: Secondary | ICD-10-CM | POA: Diagnosis not present

## 2018-10-31 HISTORY — PX: AV FISTULA PLACEMENT: SHX1204

## 2018-10-31 LAB — POCT I-STAT 4, (NA,K, GLUC, HGB,HCT)
Glucose, Bld: 100 mg/dL — ABNORMAL HIGH (ref 70–99)
HCT: 35 % — ABNORMAL LOW (ref 39.0–52.0)
Hemoglobin: 11.9 g/dL — ABNORMAL LOW (ref 13.0–17.0)
Potassium: 3.8 mmol/L (ref 3.5–5.1)
Sodium: 140 mmol/L (ref 135–145)

## 2018-10-31 SURGERY — ARTERIOVENOUS (AV) FISTULA CREATION
Anesthesia: Monitor Anesthesia Care | Site: Arm Upper | Laterality: Left

## 2018-10-31 MED ORDER — CHLORHEXIDINE GLUCONATE 4 % EX LIQD: 60.0000 mL | Freq: Once | CUTANEOUS | Status: DC

## 2018-10-31 MED ORDER — MIDAZOLAM HCL 5 MG/5ML IJ SOLN
INTRAMUSCULAR | Status: DC | PRN
  Administered 2018-10-31: 2 mg via INTRAVENOUS

## 2018-10-31 MED ORDER — MIDAZOLAM HCL 2 MG/2ML IJ SOLN: 0.5000 mg | Freq: Once | INTRAMUSCULAR | Status: DC | PRN

## 2018-10-31 MED ORDER — FENTANYL CITRATE (PF) 250 MCG/5ML IJ SOLN
INTRAMUSCULAR | Status: AC
  Filled 2018-10-31: qty 5

## 2018-10-31 MED ORDER — MIDAZOLAM HCL 2 MG/2ML IJ SOLN
INTRAMUSCULAR | Status: AC
  Filled 2018-10-31: qty 2

## 2018-10-31 MED ORDER — ONDANSETRON HCL 4 MG/2ML IJ SOLN
INTRAMUSCULAR | Status: DC | PRN
  Administered 2018-10-31: 4 mg via INTRAVENOUS

## 2018-10-31 MED ORDER — 0.9 % SODIUM CHLORIDE (POUR BTL) OPTIME
TOPICAL | Status: DC | PRN
  Administered 2018-10-31: 1000 mL

## 2018-10-31 MED ORDER — HYDROCODONE-ACETAMINOPHEN 10-325 MG PO TABS: 1.0000 | ORAL_TABLET | Freq: Four times a day (QID) | ORAL | 0 refills | Status: AC | PRN

## 2018-10-31 MED ORDER — MEPERIDINE HCL 50 MG/ML IJ SOLN: 6.2500 mg | INTRAMUSCULAR | Status: DC | PRN

## 2018-10-31 MED ORDER — SODIUM CHLORIDE 0.9 % IV SOLN
INTRAVENOUS | Status: DC
  Administered 2018-10-31 (×2): via INTRAVENOUS

## 2018-10-31 MED ORDER — FENTANYL CITRATE (PF) 100 MCG/2ML IJ SOLN
INTRAMUSCULAR | Status: DC | PRN
  Administered 2018-10-31 (×2): 50 ug via INTRAVENOUS

## 2018-10-31 MED ORDER — SODIUM CHLORIDE 0.9 % IV SOLN
INTRAVENOUS | Status: DC | PRN
  Administered 2018-10-31: 500 mL

## 2018-10-31 MED ORDER — PROPOFOL 10 MG/ML IV BOLUS
INTRAVENOUS | Status: AC
  Filled 2018-10-31: qty 20

## 2018-10-31 MED ORDER — ONDANSETRON HCL 4 MG/2ML IJ SOLN
INTRAMUSCULAR | Status: AC
  Filled 2018-10-31: qty 2

## 2018-10-31 MED ORDER — PHENYLEPHRINE 40 MCG/ML (10ML) SYRINGE FOR IV PUSH (FOR BLOOD PRESSURE SUPPORT)
PREFILLED_SYRINGE | INTRAVENOUS | Status: DC | PRN
  Administered 2018-10-31: 120 ug via INTRAVENOUS
  Administered 2018-10-31: 80 ug via INTRAVENOUS

## 2018-10-31 MED ORDER — HYDROCODONE-ACETAMINOPHEN 10-325 MG PO TABS: 1.0000 | ORAL_TABLET | Freq: Once | ORAL | Status: DC

## 2018-10-31 MED ORDER — HYDROCODONE-ACETAMINOPHEN 10-325 MG PO TABS
ORAL_TABLET | ORAL | Status: AC
  Administered 2018-10-31: 1
  Filled 2018-10-31: qty 1

## 2018-10-31 MED ORDER — FENTANYL CITRATE (PF) 100 MCG/2ML IJ SOLN
INTRAMUSCULAR | Status: AC
  Filled 2018-10-31: qty 2

## 2018-10-31 MED ORDER — PROMETHAZINE HCL 25 MG/ML IJ SOLN: 6.2500 mg | INTRAMUSCULAR | Status: DC | PRN

## 2018-10-31 MED ORDER — LIDOCAINE-EPINEPHRINE (PF) 1 %-1:200000 IJ SOLN
INTRAMUSCULAR | Status: DC | PRN
  Administered 2018-10-31: 7 mL

## 2018-10-31 MED ORDER — LIDOCAINE-EPINEPHRINE (PF) 1 %-1:200000 IJ SOLN
INTRAMUSCULAR | Status: AC
  Filled 2018-10-31: qty 30

## 2018-10-31 MED ORDER — FENTANYL CITRATE (PF) 100 MCG/2ML IJ SOLN
25.0000 ug | INTRAMUSCULAR | Status: DC | PRN
  Administered 2018-10-31 (×2): 50 ug via INTRAVENOUS

## 2018-10-31 MED ORDER — PROPOFOL 500 MG/50ML IV EMUL
INTRAVENOUS | Status: DC | PRN
  Administered 2018-10-31: 75 ug/kg/min via INTRAVENOUS
  Administered 2018-10-31: 15:00:00 via INTRAVENOUS

## 2018-10-31 MED ORDER — HEMOSTATIC AGENTS (NO CHARGE) OPTIME
TOPICAL | Status: DC | PRN
  Administered 2018-10-31: 1 via TOPICAL

## 2018-10-31 MED ORDER — PROPOFOL 1000 MG/100ML IV EMUL
INTRAVENOUS | Status: AC
  Filled 2018-10-31: qty 100

## 2018-10-31 MED ORDER — SODIUM CHLORIDE 0.9 % IV SOLN
INTRAVENOUS | Status: AC
  Filled 2018-10-31: qty 1.2

## 2018-10-31 SURGICAL SUPPLY — 41 items
ARMBAND PINK RESTRICT EXTREMIT (MISCELLANEOUS) ×4 IMPLANT
BANDAGE ELASTIC 4 VELCRO ST LF (GAUZE/BANDAGES/DRESSINGS) ×2 IMPLANT
BENZOIN TINCTURE PRP APPL 2/3 (GAUZE/BANDAGES/DRESSINGS) ×2 IMPLANT
BNDG GAUZE ELAST 4 BULKY (GAUZE/BANDAGES/DRESSINGS) ×2 IMPLANT
CANISTER SUCT 3000ML PPV (MISCELLANEOUS) ×2 IMPLANT
CLIP VESOCCLUDE MED 6/CT (CLIP) ×2 IMPLANT
CLIP VESOCCLUDE SM WIDE 6/CT (CLIP) ×2 IMPLANT
COVER PROBE W GEL 5X96 (DRAPES) ×2 IMPLANT
COVER WAND RF STERILE (DRAPES) ×2 IMPLANT
DERMABOND ADVANCED (GAUZE/BANDAGES/DRESSINGS) ×1
DERMABOND ADVANCED .7 DNX12 (GAUZE/BANDAGES/DRESSINGS) ×1 IMPLANT
ELECT REM PT RETURN 9FT ADLT (ELECTROSURGICAL) ×2
ELECTRODE REM PT RTRN 9FT ADLT (ELECTROSURGICAL) ×1 IMPLANT
GAUZE SPONGE 2X2 8PLY STRL LF (GAUZE/BANDAGES/DRESSINGS) ×1 IMPLANT
GLOVE BIO SURGEON STRL SZ 6.5 (GLOVE) ×2 IMPLANT
GLOVE BIOGEL PI IND STRL 6.5 (GLOVE) ×1 IMPLANT
GLOVE BIOGEL PI IND STRL 7.5 (GLOVE) ×1 IMPLANT
GLOVE BIOGEL PI INDICATOR 6.5 (GLOVE) ×1
GLOVE BIOGEL PI INDICATOR 7.5 (GLOVE) ×1
GLOVE SURG SS PI 7.5 STRL IVOR (GLOVE) ×2 IMPLANT
GOWN STRL REUS W/ TWL LRG LVL3 (GOWN DISPOSABLE) ×2 IMPLANT
GOWN STRL REUS W/ TWL XL LVL3 (GOWN DISPOSABLE) ×1 IMPLANT
GOWN STRL REUS W/TWL LRG LVL3 (GOWN DISPOSABLE) ×2
GOWN STRL REUS W/TWL XL LVL3 (GOWN DISPOSABLE) ×1
HEMOSTAT SNOW SURGICEL 2X4 (HEMOSTASIS) IMPLANT
KIT BASIN OR (CUSTOM PROCEDURE TRAY) ×2 IMPLANT
KIT TURNOVER KIT B (KITS) ×2 IMPLANT
NS IRRIG 1000ML POUR BTL (IV SOLUTION) ×2 IMPLANT
PACK CV ACCESS (CUSTOM PROCEDURE TRAY) ×2 IMPLANT
PAD ARMBOARD 7.5X6 YLW CONV (MISCELLANEOUS) ×4 IMPLANT
SPONGE GAUZE 2X2 STER 10/PKG (GAUZE/BANDAGES/DRESSINGS) ×1
SPONGE LAP 18X18 RF (DISPOSABLE) ×2 IMPLANT
STRIP CLOSURE SKIN 1/2X4 (GAUZE/BANDAGES/DRESSINGS) ×2 IMPLANT
SUT PROLENE 6 0 CC (SUTURE) ×2 IMPLANT
SUT VIC AB 3-0 SH 27 (SUTURE) ×1
SUT VIC AB 3-0 SH 27X BRD (SUTURE) ×1 IMPLANT
SUT VICRYL 4-0 PS2 18IN ABS (SUTURE) ×2 IMPLANT
TAPE CLOTH SURG 4X10 WHT LF (GAUZE/BANDAGES/DRESSINGS) ×2 IMPLANT
TOWEL GREEN STERILE (TOWEL DISPOSABLE) ×2 IMPLANT
UNDERPAD 30X30 (UNDERPADS AND DIAPERS) ×2 IMPLANT
WATER STERILE IRR 1000ML POUR (IV SOLUTION) ×2 IMPLANT

## 2018-10-31 NOTE — Transfer of Care (Signed)
Immediate Anesthesia Transfer of Care Note  Patient: Steven Best  Procedure(s) Performed: ARTERIOVENOUS (AV) FISTULA CREATION LEFT ARM (Left Arm Upper)  Patient Location: PACU  Anesthesia Type:MAC  Level of Consciousness: awake, oriented and patient cooperative  Airway & Oxygen Therapy: Patient Spontanous Breathing and Patient connected to nasal cannula oxygen  Post-op Assessment: Report given to RN and Post -op Vital signs reviewed and stable  Post vital signs: Reviewed  Last Vitals:  Vitals Value Taken Time  BP 104/66 10/31/2018  3:40 PM  Temp    Pulse 77 10/31/2018  3:43 PM  Resp 14 10/31/2018  3:43 PM  SpO2 99 % 10/31/2018  3:43 PM  Vitals shown include unvalidated device data.  Last Pain:  Vitals:   10/31/18 1101  TempSrc: Oral         Complications: No apparent anesthesia complications

## 2018-10-31 NOTE — Anesthesia Postprocedure Evaluation (Signed)
Anesthesia Post Note  Patient: Steven Best  Procedure(s) Performed: ARTERIOVENOUS (AV) FISTULA CREATION LEFT ARM (Left Arm Upper)     Patient location during evaluation: PACU Anesthesia Type: MAC Level of consciousness: awake and alert, oriented and patient uncooperative Pain control: pain improving. Vital Signs Assessment: post-procedure vital signs reviewed and stable Respiratory status: spontaneous breathing, nonlabored ventilation and respiratory function stable Cardiovascular status: blood pressure returned to baseline and stable Postop Assessment: no apparent nausea or vomiting Anesthetic complications: no    Last Vitals:  Vitals:   10/31/18 1625 10/31/18 1630  BP: 112/69   Pulse: 74   Resp: 16   Temp:  (!) 36.3 C  SpO2: 95%     Last Pain:  Vitals:   10/31/18 1620  TempSrc:   PainSc: 9                  Zakaiya Lares,E. Dashay Giesler
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
## 2018-10-31 NOTE — Interval H&P Note (Signed)
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 After consideration of risks, benefits and other options for treatment, the patient has consented to  Procedure(s): ARTERIOVENOUS (AV) FISTULA CREATION LEFT ARM (Left) as a surgical intervention .  The patient's history has been reviewed, patient examined, no change in status, stable for surgery.  I have reviewed the patient's chart and labs.  Questions were answered to the patient's satisfaction.     Annamarie Major

## 2018-10-31 NOTE — Op Note (Signed)
    Patient name: Steven Best MRN: [id number] DOB: 1966/08/16 Sex: male  10/31/2018 Pre-operative Diagnosis: ESRD Post-operative diagnosis:  Same Surgeon:  Annamarie Major Assistants:  Laurence Slate Procedure:   Left brachiocephalic fistula creation Anesthesia: MAC Blood Loss: Minimal Specimens: none  Findings:  Disease-free 5 mm artery.  Excellent caliber 5 mm cephalic vein  Indications: The patient has previously had a left radiocephalic fistula which has occluded.  He comes in today for a left brachiocephalic fistula  Procedure:  The patient was identified in the holding area and taken to [place] [facility]  The patient was then placed supine on the table. MAC anesthesia was administered.  The patient was prepped and draped in the usual sterile fashion.  A time out was called and antibiotics were administered.  Ultrasound was used to evaluate the cephalic vein in the upper arm.  It was of excellent caliber measuring 5 mm.  1% lidocaine was used for local anesthesia.  A transverse incision was made just proximal to the antecubital crease.  I first dissected out the brachial artery which was disease-free 5 mm artery.  I then exposed the cephalic vein.  This was a healthy appearing 5 mm vein.  Several side branches were ligated between silk ties.  The vein was marked for orientation and then ligated distally.  It distended nicely with heparin saline.  Next, the brachial artery was occluded with vascular clamps.  A #11 blade was used to make an arteriotomy which was extended longitudinally with Potts scissors.  The vein was cut the appropriate length and spatulated to fit the size the arteriotomy.  A running anastomosis was created with 6-0 Prolene.  Prior to completion the appropriate flushing maneuvers were performed the anastomosis was completed.  There is an excellent thrill within the fistula.  The patient had brisk radial and ulnar Doppler signals.  There was an excellent thrill within the  fistula however it was somewhat deep.  Once hemostasis was satisfactory the incision was closed 2 layers with 3-0 Vicryl followed by Steri-Strips and dressing.   Disposition: To PACU stable   V. Annamarie Major, M.D. [facility] Office: [phone] Pager:  [phone]

## 2018-10-31 NOTE — Discharge Instructions (Signed)
° °  [facility]  ° °Discharge Instructions ° °AV Fistula or Graft Surgery for Dialysis Access ° °Please refer to the following instructions for your post-procedure care. Your surgeon or physician assistant will discuss any changes with you. ° °Activity ° °You may drive the day following your surgery, if you are comfortable and no longer taking prescription pain medication. Resume full activity as the soreness in your incision resolves. ° °Bathing/Showering ° °You may shower after you go home. Keep your incision dry for 48 hours. Do not soak in a bathtub, hot tub, or swim until the incision heals completely. You may not shower if you have a hemodialysis catheter. ° °Incision Care ° °Clean your incision with mild soap and water after 48 hours. Pat the area dry with a clean towel. You do not need a bandage unless otherwise instructed. Do not apply any ointments or creams to your incision. You may have skin glue on your incision. Do not peel it off. It will come off on its own in about one week. Your arm may swell a bit after surgery. To reduce swelling use pillows to elevate your arm so it is above your heart. Your doctor will tell you if you need to lightly wrap your arm with an ACE bandage. ° °Diet ° °Resume your normal diet. There are not special food restrictions following this procedure. In order to heal from your surgery, it is CRITICAL to get adequate nutrition. Your body requires vitamins, minerals, and protein. Vegetables are the best source of vitamins and minerals. Vegetables also provide the perfect balance of protein. Processed food has little nutritional value, so try to avoid this. ° °Medications ° °Resume taking all of your medications. If your incision is causing pain, you may take over-the counter pain relievers such as acetaminophen (Tylenol). If you were prescribed a stronger pain medication, please be aware these medications can cause nausea and constipation. Prevent  nausea by taking the medication with a snack or meal. Avoid constipation by drinking plenty of fluids and eating foods with high amount of fiber, such as fruits, vegetables, and grains. Do not take Tylenol if you are taking prescription pain medications. ° ° ° ° °Follow up °Your surgeon may want to see you in the office following your access surgery. If so, this will be arranged at the time of your surgery. ° °Please call us immediately for any of the following conditions: ° °Increased pain, redness, drainage (pus) from your incision site °Fever of 101 degrees or higher °Severe or worsening pain at your incision site °Hand pain or numbness. ° °Reduce your risk of vascular disease: ° °Stop smoking. If you would like help, call [facility] at 1-800-QUIT-NOW (1-800-784-8669) or [place] at 336-586-4000 ° °Manage your cholesterol °Maintain a desired weight °Control your diabetes °Keep your blood pressure down ° °Dialysis ° °It will take several weeks to several months for your new dialysis access to be ready for use. Your surgeon will determine when it is OK to use it. Your nephrologist will continue to direct your dialysis. You can continue to use your Permcath until your new access is ready for use. ° °If you have any questions, please call the office at [phone]. ° °

## 2018-10-31 NOTE — Anesthesia Procedure Notes (Signed)
Procedure Name: MAC Date/Time: 10/31/2018 2:14 PM Performed by: Carney Living, CRNA Pre-anesthesia Checklist: Patient identified, Emergency Drugs available, Suction available, Patient being monitored and Timeout performed Patient Re-evaluated:Patient Re-evaluated prior to induction Oxygen Delivery Method: Simple face mask Induction Type: IV induction

## 2018-11-01 ENCOUNTER — Encounter (HOSPITAL_COMMUNITY): Payer: Self-pay | Admitting: Surgery

## 2018-11-02 ENCOUNTER — Telehealth: Payer: Self-pay | Admitting: Surgery

## 2018-11-02 NOTE — Telephone Encounter (Signed)
sch appt [facility] to pt mld ltr 12/17/2018 1pm Dialysis duplex 2pm p/o PA

## 2018-11-02 NOTE — Telephone Encounter (Signed)
-----   Message from Ulyses Amor, [place] sent at 10/31/2018  3:25 PM EST -----  S/P left AV fistula needs fistula duplex and f/u in PA clinic in 6-7 weeks on a Monday if possible

## 2018-12-17 ENCOUNTER — Encounter (HOSPITAL_COMMUNITY): Payer: BLUE CROSS/BLUE SHIELD

## 2018-12-24 ENCOUNTER — Other Ambulatory Visit: Payer: Self-pay

## 2018-12-24 DIAGNOSIS — N186 End stage renal disease: Secondary | ICD-10-CM

## 2018-12-24 DIAGNOSIS — Z992 Dependence on renal dialysis: Principal | ICD-10-CM

## 2018-12-26 ENCOUNTER — Other Ambulatory Visit: Payer: Self-pay

## 2018-12-26 ENCOUNTER — Ambulatory Visit (INDEPENDENT_AMBULATORY_CARE_PROVIDER_SITE_OTHER): Payer: Self-pay | Admitting: Physician Assistant

## 2018-12-26 ENCOUNTER — Ambulatory Visit (HOSPITAL_COMMUNITY)
Admission: RE | Admit: 2018-12-26 | Discharge: 2018-12-26 | Disposition: A | Discharge status: Home / Self Care | Payer: BLUE CROSS/BLUE SHIELD | Source: Ambulatory Visit | Attending: Surgery | Admitting: Surgery

## 2018-12-26 VITALS — BP 115/71 | HR 88 | Temp 97.7°F | Resp 16 | Ht 74.0 in | Wt 282.0 lb

## 2018-12-26 DIAGNOSIS — Z992 Dependence on renal dialysis: Secondary | ICD-10-CM | POA: Diagnosis not present

## 2018-12-26 DIAGNOSIS — N186 End stage renal disease: Secondary | ICD-10-CM | POA: Diagnosis not present

## 2018-12-26 NOTE — Progress Notes (Signed)
    Postoperative Access Visit   History of Present Illness   Steven Best is a 53 y.o. year old male who presents for postoperative follow-up for: left brachiocephalic arteriovenous fistula by Dr. Trula Slade (Date: 10/31/18).  He previously had a L radiocephalic fistula.  The patient's wounds are healed.  The patient denies steal symptoms.  The patient is able to complete their activities of daily living.  He is currently dialyzing on a TTS schedule via [facility].  Physical Examination   Vitals:   12/26/18 1424  BP: 115/71  Pulse: 88  Resp: 16  Temp: 97.7 F (36.5 C)  TempSrc: Oral  SpO2: 97%  Weight: 282 lb (127.9 kg)  Height: 6\' 2"  (1.88 m)   Body mass index is 36.21 kg/m.  left arm Incision is healed, hand grip is 5/5, sensation in digits is intact, palpable thrill, bruit can be auscultated, palpable L radial pulse     Medical Decision Making   Steven Best is a 53 y.o. year old male who presents s/p left brachiocephalic arteriovenous fistula   Patent L brachiocephalic fistula without signs or symptoms of steal syndrome  Duplex has fistula >65mm from skin surface however on physical exam fistula is easily palpable in antecubitum to mid upper arm.  I explained to the patient our guidelines for superficialization/translocation.  I offered the patient this procedure.  The other option would be to allow fistula to be accessed after 12 weeks postop and if translocation is needed in the future we can proceed at that time.  Patient strongly prefers to let the dialysis center cannulate fistula after 12-week postop period.  I think this option is reasonable based on diameter of fistula and physical exam.  Fistula will be ready for use 01/23/19  The patient's tunneled dialysis catheter can be removed when Nephrology is comfortable with the performance of the fistula  The will follow up on an as needed basis   Dagoberto Ligas PA-C [facility]  [place]: [phone]

## 2019-01-14 ENCOUNTER — Other Ambulatory Visit: Payer: Self-pay | Admitting: Nephrology

## 2019-01-14 ENCOUNTER — Other Ambulatory Visit (HOSPITAL_COMMUNITY): Payer: Self-pay | Admitting: Nephrology

## 2019-01-14 DIAGNOSIS — Z940 Kidney transplant status: Secondary | ICD-10-CM

## 2019-01-16 ENCOUNTER — Other Ambulatory Visit (HOSPITAL_COMMUNITY): Payer: BLUE CROSS/BLUE SHIELD

## 2019-01-23 ENCOUNTER — Ambulatory Visit (HOSPITAL_COMMUNITY): Payer: BLUE CROSS/BLUE SHIELD | Attending: Internal Medicine

## 2019-01-23 DIAGNOSIS — I700 Atherosclerosis of aorta: Secondary | ICD-10-CM | POA: Diagnosis not present

## 2019-01-23 DIAGNOSIS — Z940 Kidney transplant status: Secondary | ICD-10-CM | POA: Diagnosis present

## 2020-07-14 ENCOUNTER — Encounter: Payer: Self-pay | Admitting: Nephrology

## 2020-07-22 ENCOUNTER — Ambulatory Visit (HOSPITAL_COMMUNITY)
Admission: RE | Admit: 2020-07-22 | Discharge: 2020-07-22 | Disposition: A | Discharge status: Home / Self Care | Payer: BC Managed Care – PPO | Source: Ambulatory Visit | Attending: Physician Assistant | Admitting: Physician Assistant

## 2020-07-22 ENCOUNTER — Other Ambulatory Visit: Payer: Self-pay

## 2020-07-22 ENCOUNTER — Encounter (HOSPITAL_COMMUNITY): Payer: Self-pay | Admitting: Physician Assistant

## 2020-07-22 VITALS — BP 118/80 | HR 90 | Ht 74.0 in | Wt 312.2 lb

## 2020-07-22 DIAGNOSIS — F419 Anxiety disorder, unspecified: Secondary | ICD-10-CM | POA: Insufficient documentation

## 2020-07-22 DIAGNOSIS — I120 Hypertensive chronic kidney disease with stage 5 chronic kidney disease or end stage renal disease: Secondary | ICD-10-CM | POA: Insufficient documentation

## 2020-07-22 DIAGNOSIS — Z6841 Body Mass Index (BMI) 40.0 and over, adult: Secondary | ICD-10-CM | POA: Insufficient documentation

## 2020-07-22 DIAGNOSIS — I10 Essential (primary) hypertension: Secondary | ICD-10-CM | POA: Diagnosis not present

## 2020-07-22 DIAGNOSIS — R7303 Prediabetes: Secondary | ICD-10-CM | POA: Insufficient documentation

## 2020-07-22 DIAGNOSIS — E669 Obesity, unspecified: Secondary | ICD-10-CM | POA: Diagnosis not present

## 2020-07-22 DIAGNOSIS — D6869 Other thrombophilia: Secondary | ICD-10-CM | POA: Diagnosis not present

## 2020-07-22 DIAGNOSIS — Z8249 Family history of ischemic heart disease and other diseases of the circulatory system: Secondary | ICD-10-CM | POA: Diagnosis not present

## 2020-07-22 DIAGNOSIS — Z96653 Presence of artificial knee joint, bilateral: Secondary | ICD-10-CM | POA: Diagnosis not present

## 2020-07-22 DIAGNOSIS — Z833 Family history of diabetes mellitus: Secondary | ICD-10-CM | POA: Diagnosis not present

## 2020-07-22 DIAGNOSIS — Z992 Dependence on renal dialysis: Secondary | ICD-10-CM | POA: Diagnosis not present

## 2020-07-22 DIAGNOSIS — D8689 Sarcoidosis of other sites: Secondary | ICD-10-CM | POA: Diagnosis not present

## 2020-07-22 DIAGNOSIS — I480 Paroxysmal atrial fibrillation: Secondary | ICD-10-CM | POA: Diagnosis present

## 2020-07-22 DIAGNOSIS — Z87891 Personal history of nicotine dependence: Secondary | ICD-10-CM | POA: Insufficient documentation

## 2020-07-22 DIAGNOSIS — N186 End stage renal disease: Secondary | ICD-10-CM | POA: Diagnosis not present

## 2020-07-22 DIAGNOSIS — Z9049 Acquired absence of other specified parts of digestive tract: Secondary | ICD-10-CM | POA: Insufficient documentation

## 2020-07-22 DIAGNOSIS — Z7901 Long term (current) use of anticoagulants: Secondary | ICD-10-CM | POA: Insufficient documentation

## 2020-07-22 DIAGNOSIS — Z79899 Other long term (current) drug therapy: Secondary | ICD-10-CM | POA: Insufficient documentation

## 2020-07-22 DIAGNOSIS — R002 Palpitations: Secondary | ICD-10-CM | POA: Diagnosis not present
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
## 2020-08-17 ENCOUNTER — Ambulatory Visit (INDEPENDENT_AMBULATORY_CARE_PROVIDER_SITE_OTHER): Payer: BC Managed Care – PPO | Admitting: Internal Medicine

## 2020-08-17 ENCOUNTER — Encounter: Payer: Self-pay | Admitting: Internal Medicine

## 2020-08-17 ENCOUNTER — Other Ambulatory Visit: Payer: Self-pay

## 2020-08-17 VITALS — BP 128/78 | HR 100 | Ht 74.0 in | Wt 321.9 lb

## 2020-08-17 DIAGNOSIS — I4819 Other persistent atrial fibrillation: Secondary | ICD-10-CM | POA: Diagnosis not present

## 2020-08-17 DIAGNOSIS — I10 Essential (primary) hypertension: Secondary | ICD-10-CM | POA: Diagnosis not present

## 2020-08-17 MED ORDER — ELIQUIS 5 MG PO TABS: ORAL_TABLET | ORAL | 5 refills | Status: DC

## 2020-08-17 NOTE — Patient Instructions (Signed)
Medication Instructions:  Your physician recommends that you continue on your current medications as directed. Please refer to the Current Medication list given to you today.  *If you need a refill on your cardiac medications before your next appointment, please call your pharmacy*   Lab Work: TODAY: CMET, CBC  If you have labs (blood work) drawn today and your tests are completely normal, you will receive your results only by: Marland Kitchen MyChart Message (if you have MyChart) OR . A paper copy in the mail If you have any lab test that is abnormal or we need to change your treatment, we will call you to review the results.   Testing/Procedures: Your physician has requested that you have an echocardiogram. Echocardiography is a painless test that uses sound waves to create images of your heart. It provides your doctor with information about the size and shape of your heart and how well your heart's chambers and valves are working. This procedure takes approximately one hour. There are no restrictions for this procedure.  Your physician has requested that you have a lexiscan myoview. For further information please visit [email or web address]. Please follow instruction sheet, as given.  Your physician has recommended that you have a Cardioversion (DCCV). Electrical Cardioversion uses a jolt of electricity to your heart either through paddles or wired patches attached to your chest. This is a controlled, usually prescheduled, procedure. Defibrillation is done under light anesthesia in the hospital, and you usually go home the day of the procedure. This is done to get your heart back into a normal rhythm. You are not awake for the procedure. Please see the instruction sheet given to you today.   Follow-Up: At [facility], you and your health needs are our priority.  As part of our continuing mission to provide you with exceptional heart care, we have created designated Provider Care Teams.  These Care  Teams include your primary Cardiologist (physician) and Advanced Practice Providers (APPs -  Physician Assistants and Nurse Practitioners) who all work together to provide you with the care you need, when you need it.  We recommend signing up for the patient portal called "MyChart".  Sign up information is provided on this After Visit Summary.  MyChart is used to connect with patients for Virtual Visits (Telemedicine).  Patients are able to view lab/test results, encounter notes, upcoming appointments, etc.  Non-urgent messages can be sent to your provider as well.   To learn more about what you can do with MyChart, go to [email or web address].    Your next appointment:   6 month(s)  The format for your next appointment:   In Person  Provider:   Rudean Haskell, MD   Other Instructions  You are scheduled for a Cardioversion on 08/27/20 with Dr. Gasper Sells.  Please arrive at the [facility] (Main Entrance A) at [facility]: [place], [place] [address] at 1:30 PM. (1 hour prior to procedure)  DIET: Nothing to eat or drink after midnight except a sip of water with medications (see medication instructions below)  Medication Instructions:  Continue your anticoagulant: Eliquis You will need to continue your anticoagulant after your procedure until you are told by your Provider that it is safe to stop   Labs: TODAY: CMET, CBC  Due to recent COVID-19 restrictions implemented by our local and state authorities and in an effort to keep both patients and staff as safe as possible, our hospital system requires COVID-19 testing prior to certain scheduled hospital procedures.  Please go to [place]. [place], [place] [address] on 08/25/20 at 2:30 PM  .  This is a drive up testing site.  You will not need to exit your vehicle.  You will not be billed at the time of testing but may receive a bill later depending on your insurance. You must agree to self-quarantine  from the time of your testing until the procedure date on 08/27/20.  This should included staying home with ONLY the people you live with.  Avoid take-out, grocery store shopping or leaving the house for any non-emergent reason.  Failure to have your COVID-19 test done on the date and time you have been scheduled will result in cancellation of your procedure.  Please call our office at [phone] if you have any questions.   You must have a responsible person to drive you home and stay in the waiting area during your procedure. Failure to do so could result in cancellation.  Bring your insurance cards.  *Special Note: Every effort is made to have your procedure done on time. Occasionally there are emergencies that occur at the hospital that may cause delays. Please be patient if a delay does occur.
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
## 2020-08-18 ENCOUNTER — Telehealth (HOSPITAL_COMMUNITY): Payer: Self-pay | Admitting: *Deleted

## 2020-08-18 ENCOUNTER — Encounter (HOSPITAL_COMMUNITY): Payer: Self-pay | Admitting: *Deleted

## 2020-08-18 LAB — COMPREHENSIVE METABOLIC PANEL
ALT: 14 IU/L (ref 0–44)
AST: 14 IU/L (ref 0–40)
Albumin/Globulin Ratio: 1.8 (ref 1.2–2.2)
Albumin: 4.8 g/dL (ref 3.8–4.9)
Alkaline Phosphatase: 121 IU/L (ref 44–121)
BUN/Creatinine Ratio: 8 — ABNORMAL LOW (ref 9–20)
BUN: 53 mg/dL — ABNORMAL HIGH (ref 6–24)
Bilirubin Total: 0.4 mg/dL (ref 0.0–1.2)
CO2: 23 mmol/L (ref 20–29)
Calcium: 8.1 mg/dL — ABNORMAL LOW (ref 8.7–10.2)
Chloride: 92 mmol/L — ABNORMAL LOW (ref 96–106)
Creatinine, Ser: 6.95 mg/dL — ABNORMAL HIGH (ref 0.76–1.27)
GFR calc Af Amer: 9 mL/min/{1.73_m2} — ABNORMAL LOW (ref >59)
GFR calc non Af Amer: 8 mL/min/{1.73_m2} — ABNORMAL LOW (ref >59)
Globulin, Total: 2.6 g/dL (ref 1.5–4.5)
Glucose: 98 mg/dL (ref 65–99)
Potassium: 4.4 mmol/L (ref 3.5–5.2)
Sodium: 135 mmol/L (ref 134–144)
Total Protein: 7.4 g/dL (ref 6.0–8.5)

## 2020-08-18 LAB — CBC
Hematocrit: 30.2 % — ABNORMAL LOW (ref 37.5–51.0)
Hemoglobin: 10.3 g/dL — ABNORMAL LOW (ref 13.0–17.7)
MCH: 33.1 pg — ABNORMAL HIGH (ref 26.6–33.0)
MCHC: 34.1 g/dL (ref 31.5–35.7)
MCV: 97 fL (ref 79–97)
Platelets: 165 10*3/uL (ref 150–450)
RBC: 3.11 x10E6/uL — ABNORMAL LOW (ref 4.14–5.80)
RDW: 13.8 % (ref 11.6–15.4)
WBC: 6.3 10*3/uL (ref 3.4–10.8)

## 2020-08-18 NOTE — Telephone Encounter (Signed)
Patient given detailed instructions per Myocardial Perfusion Study Information Sheet for the test on 08/19/2020 at 0815. Patient notified to arrive 15 minutes early and that it is imperative to arrive on time for appointment to keep from having the test rescheduled.  If you need to cancel or reschedule your appointment, please call the office within 24 hours of your appointment. . Patient verbalized understanding.[place] Mychart letter sent with instrucdtions

## 2020-08-19 ENCOUNTER — Other Ambulatory Visit: Payer: Self-pay

## 2020-08-19 ENCOUNTER — Ambulatory Visit (HOSPITAL_COMMUNITY): Payer: BC Managed Care – PPO | Attending: Cardiology

## 2020-08-19 DIAGNOSIS — I4819 Other persistent atrial fibrillation: Secondary | ICD-10-CM | POA: Diagnosis not present

## 2020-08-19 DIAGNOSIS — I10 Essential (primary) hypertension: Secondary | ICD-10-CM | POA: Diagnosis not present

## 2020-08-19 MED ORDER — TECHNETIUM TC 99M TETROFOSMIN IV KIT
30.7000 | PACK | Freq: Once | INTRAVENOUS | Status: AC | PRN
  Administered 2020-08-19: 30.7 via INTRAVENOUS
  Filled 2020-08-19: qty 31

## 2020-08-19 MED ORDER — REGADENOSON 0.4 MG/5ML IV SOLN
0.4000 mg | Freq: Once | INTRAVENOUS | Status: AC
  Administered 2020-08-19: 0.4 mg via INTRAVENOUS

## 2020-08-20 ENCOUNTER — Telehealth: Payer: Self-pay

## 2020-08-20 ENCOUNTER — Telehealth: Payer: Self-pay | Admitting: Internal Medicine

## 2020-08-20 ENCOUNTER — Other Ambulatory Visit (HOSPITAL_COMMUNITY): Payer: BC Managed Care – PPO

## 2020-08-20 ENCOUNTER — Ambulatory Visit (HOSPITAL_COMMUNITY): Payer: BC Managed Care – PPO | Attending: Internal Medicine

## 2020-08-20 ENCOUNTER — Ambulatory Visit (HOSPITAL_BASED_OUTPATIENT_CLINIC_OR_DEPARTMENT_OTHER): Payer: BC Managed Care – PPO

## 2020-08-20 DIAGNOSIS — I10 Essential (primary) hypertension: Secondary | ICD-10-CM | POA: Diagnosis not present

## 2020-08-20 DIAGNOSIS — I4819 Other persistent atrial fibrillation: Secondary | ICD-10-CM | POA: Diagnosis not present

## 2020-08-20 LAB — MYOCARDIAL PERFUSION IMAGING
LV dias vol: 165 mL (ref 62–150)
LV sys vol: 81 mL
Peak HR: 100 {beats}/min
Rest HR: 86 {beats}/min
SDS: 7
SRS: 4
SSS: 11
TID: 0.98

## 2020-08-20 LAB — ECHOCARDIOGRAM COMPLETE
AR max vel: 2.01 cm2
AV Area VTI: 2.25 cm2
AV Area mean vel: 1.86 cm2
AV Mean grad: 19 mmHg
AV Peak grad: 28.3 mmHg
Ao pk vel: 2.66 m/s
Area-P 1/2: 2.72 cm2
S' Lateral: 2.6 cm

## 2020-08-20 MED ORDER — APIXABAN 5 MG PO TABS: 5.0000 mg | ORAL_TABLET | Freq: Two times a day (BID) | ORAL | 1 refills | Status: DC

## 2020-08-20 MED ORDER — TECHNETIUM TC 99M TETROFOSMIN IV KIT
30.4000 | PACK | Freq: Once | INTRAVENOUS | Status: AC | PRN
  Administered 2020-08-20: 30.4 via INTRAVENOUS
  Filled 2020-08-20: qty 31

## 2020-08-20 MED ORDER — PERFLUTREN LIPID MICROSPHERE
1.0000 mL | INTRAVENOUS | Status: AC | PRN
  Administered 2020-08-20: 3 mL via INTRAVENOUS

## 2020-08-20 NOTE — Telephone Encounter (Signed)
**Note De-Identified Tashi Andujo Obfuscation** Following message received from covermymeds:  Jeri Lager Key: [id number] - PA Case ID: [id number]  Outcome  This request has been approved using information available on the patient's profile.  Case [id number] Status:Approved;Review Type:Prior Auth Coverage Start Date:07/21/2020;Coverage End Date:08/20/2021 Drug Eliquis 5MG  tablets  [employer] Electronic PA Form (2017 NCPDP)   I have notified [facility] of this approval and was advised that the cost to the pt is now Zero for a 90 day supply of his Eliquis 5 mg. They also state that they are filling his prescription and will notify the pt to pick up.  I called the pt and made him aware of this approval as well and he expressed much gratitude towards the office for taking care of this Eliquis PA so quickly.

## 2020-08-20 NOTE — Telephone Encounter (Signed)
**Note De-Identified Steven Best Obfuscation** I started a Eliquis PA through covermymeds: Key: [id number]

## 2020-08-20 NOTE — Telephone Encounter (Signed)
*  STAT* If patient is at the pharmacy, call can be transferred to refill team.   1. Which medications need to be refilled? (please list name of each medication and dose if known) a new prescription for Eliquis*  2. Which pharmacy/location (including street and city if local pharmacy) is medication to be sent to? [facility] [address], [place],  3. Do they need a 30 day or 90 day supply?90 days and refills

## 2020-08-20 NOTE — Telephone Encounter (Signed)
Prescription refill request for Eliquis received. Indication:afib Last office visit:08/17/20 Scr:6.95 on 08/17/20 Age: 54 Weight: 145kg

## 2020-08-24 ENCOUNTER — Other Ambulatory Visit (HOSPITAL_COMMUNITY)
Admission: RE | Admit: 2020-08-24 | Discharge: 2020-08-24 | Disposition: A | Discharge status: Home / Self Care | Payer: BC Managed Care – PPO | Source: Ambulatory Visit | Attending: Internal Medicine | Admitting: Internal Medicine

## 2020-08-24 ENCOUNTER — Ambulatory Visit (HOSPITAL_COMMUNITY): Payer: BC Managed Care – PPO | Admitting: Physician Assistant

## 2020-08-24 DIAGNOSIS — Z20822 Contact with and (suspected) exposure to covid-19: Secondary | ICD-10-CM | POA: Diagnosis not present

## 2020-08-24 DIAGNOSIS — Z01812 Encounter for preprocedural laboratory examination: Secondary | ICD-10-CM | POA: Diagnosis not present

## 2020-08-24 LAB — SARS CORONAVIRUS 2 (TAT 6-24 HRS): SARS Coronavirus 2: NEGATIVE

## 2020-08-25 ENCOUNTER — Other Ambulatory Visit (HOSPITAL_COMMUNITY): Payer: BC Managed Care – PPO

## 2020-08-27 ENCOUNTER — Encounter (HOSPITAL_COMMUNITY): Payer: Self-pay | Admitting: Internal Medicine

## 2020-08-27 ENCOUNTER — Ambulatory Visit (HOSPITAL_COMMUNITY): Payer: BC Managed Care – PPO | Admitting: Certified Registered"

## 2020-08-27 ENCOUNTER — Encounter (HOSPITAL_COMMUNITY)
Admission: RE | Disposition: A | Discharge status: Home / Self Care | Payer: BC Managed Care – PPO | Source: Home / Self Care | Attending: Internal Medicine

## 2020-08-27 ENCOUNTER — Ambulatory Visit (HOSPITAL_COMMUNITY)
Admission: RE | Admit: 2020-08-27 | Discharge: 2020-08-27 | Disposition: A | Discharge status: Home / Self Care | Payer: BC Managed Care – PPO | Attending: Internal Medicine | Admitting: Internal Medicine

## 2020-08-27 ENCOUNTER — Telehealth: Payer: Self-pay

## 2020-08-27 ENCOUNTER — Other Ambulatory Visit: Payer: Self-pay

## 2020-08-27 DIAGNOSIS — N186 End stage renal disease: Secondary | ICD-10-CM | POA: Diagnosis not present

## 2020-08-27 DIAGNOSIS — Z8249 Family history of ischemic heart disease and other diseases of the circulatory system: Secondary | ICD-10-CM | POA: Diagnosis not present

## 2020-08-27 DIAGNOSIS — I4819 Other persistent atrial fibrillation: Secondary | ICD-10-CM | POA: Diagnosis present

## 2020-08-27 DIAGNOSIS — I120 Hypertensive chronic kidney disease with stage 5 chronic kidney disease or end stage renal disease: Secondary | ICD-10-CM | POA: Diagnosis not present

## 2020-08-27 DIAGNOSIS — Z79899 Other long term (current) drug therapy: Secondary | ICD-10-CM | POA: Diagnosis not present

## 2020-08-27 DIAGNOSIS — F419 Anxiety disorder, unspecified: Secondary | ICD-10-CM | POA: Diagnosis not present

## 2020-08-27 DIAGNOSIS — D631 Anemia in chronic kidney disease: Secondary | ICD-10-CM | POA: Insufficient documentation

## 2020-08-27 DIAGNOSIS — Z6841 Body Mass Index (BMI) 40.0 and over, adult: Secondary | ICD-10-CM | POA: Diagnosis not present

## 2020-08-27 DIAGNOSIS — I4891 Unspecified atrial fibrillation: Secondary | ICD-10-CM

## 2020-08-27 DIAGNOSIS — D869 Sarcoidosis, unspecified: Secondary | ICD-10-CM | POA: Diagnosis not present

## 2020-08-27 DIAGNOSIS — Z7901 Long term (current) use of anticoagulants: Secondary | ICD-10-CM | POA: Diagnosis not present

## 2020-08-27 DIAGNOSIS — Z87891 Personal history of nicotine dependence: Secondary | ICD-10-CM | POA: Insufficient documentation

## 2020-08-27 DIAGNOSIS — I480 Paroxysmal atrial fibrillation: Secondary | ICD-10-CM

## 2020-08-27 HISTORY — PX: CARDIOVERSION: SHX1299

## 2020-08-27 LAB — POCT I-STAT, CHEM 8
BUN: 53 mg/dL — ABNORMAL HIGH (ref 6–20)
Calcium, Ion: 0.83 mmol/L — CL (ref 1.15–1.40)
Chloride: 98 mmol/L (ref 98–111)
Creatinine, Ser: 9.8 mg/dL — ABNORMAL HIGH (ref 0.61–1.24)
Glucose, Bld: 109 mg/dL — ABNORMAL HIGH (ref 70–99)
HCT: 31 % — ABNORMAL LOW (ref 39.0–52.0)
Hemoglobin: 10.5 g/dL — ABNORMAL LOW (ref 13.0–17.0)
Potassium: 4.2 mmol/L (ref 3.5–5.1)
Sodium: 137 mmol/L (ref 135–145)
TCO2: 24 mmol/L (ref 22–32)

## 2020-08-27 SURGERY — CARDIOVERSION
Anesthesia: General

## 2020-08-27 MED ORDER — SODIUM CHLORIDE 0.9 % IV SOLN: INTRAVENOUS | Status: DC | PRN

## 2020-08-27 MED ORDER — LIDOCAINE HCL (CARDIAC) PF 100 MG/5ML IV SOSY
PREFILLED_SYRINGE | INTRAVENOUS | Status: DC | PRN
  Administered 2020-08-27: 80 mg via INTRAVENOUS

## 2020-08-27 MED ORDER — AMIODARONE HCL 200 MG PO TABS: 200.0000 mg | ORAL_TABLET | Freq: Every day | ORAL | 3 refills | Status: DC

## 2020-08-27 MED ORDER — SODIUM CHLORIDE 0.9 % IV SOLN: INTRAVENOUS | Status: DC

## 2020-08-27 MED ORDER — PROPOFOL 10 MG/ML IV BOLUS
INTRAVENOUS | Status: DC | PRN
  Administered 2020-08-27: 120 mg via INTRAVENOUS

## 2020-08-27 MED ORDER — AMIODARONE HCL 400 MG PO TABS: 400.0000 mg | ORAL_TABLET | Freq: Three times a day (TID) | ORAL | 0 refills | Status: DC

## 2020-08-27 NOTE — Anesthesia Postprocedure Evaluation (Signed)
Anesthesia Post Note  Patient: Steven Best  Procedure(s) Performed: CARDIOVERSION (N/A )     Patient location during evaluation: Endoscopy Anesthesia Type: General Level of consciousness: awake and alert Pain management: pain level controlled Vital Signs Assessment: post-procedure vital signs reviewed and stable Respiratory status: spontaneous breathing, nonlabored ventilation, respiratory function stable and patient connected to nasal cannula oxygen Cardiovascular status: blood pressure returned to baseline and stable Postop Assessment: no apparent nausea or vomiting Anesthetic complications: no   No complications documented.  Last Vitals:  Vitals:   08/27/20 0830 08/27/20 0839  BP: 120/60 124/60  Pulse: 83 86  Resp: (!) 22 16  Temp:    SpO2: 96% 96%    Last Pain:  Vitals:   08/27/20 0839  TempSrc:   PainSc: 0-No pain                 Belenda Cruise P Donn Zanetti

## 2020-08-27 NOTE — Telephone Encounter (Signed)
Referral placed for EP.

## 2020-08-27 NOTE — Transfer of Care (Signed)
Immediate Anesthesia Transfer of Care Note  Patient: Steven Best  Procedure(s) Performed: CARDIOVERSION (N/A )  Patient Location: Endoscopy Unit  Anesthesia Type:General  Level of Consciousness: awake, alert  and oriented  Airway & Oxygen Therapy: Patient Spontanous Breathing  Post-op Assessment: Report given to RN, Post -op Vital signs reviewed and stable and Patient moving all extremities X 4  Post vital signs: Reviewed and stable  Last Vitals:  Vitals Value Taken Time  BP    Temp    Pulse    Resp    SpO2      Last Pain:  Vitals:   08/27/20 0750  TempSrc: Oral  PainSc: 0-No pain         Complications: No complications documented.

## 2020-08-27 NOTE — CV Procedure (Signed)
   Electrical Cardioversion Procedure Note Steven Best [id number] Sep 17, 1966  Procedure: Electrical Cardioversion  Indications:  Atrial Fibrillation  Time Out: Verified patient identification, verified procedure,medications/allergies/relevent history reviewed, required imaging and test results available.  Performed  Procedure Details  The patient was NPO after midnight. Anesthesia was administered at the beside  by Dr.Stoltsfuz with 120mg  of propofol.  Cardioversion was done with synchronized biphasic defibrillation with AP pads with 200 J.  The patient converted to normal sinus rhythm. The patient tolerated the procedure well   IMPRESSION:  Successful cardioversion of atrial fibrillation    Steven Best A Steven Best 08/27/2020, 8:17 AM

## 2020-08-27 NOTE — Anesthesia Preprocedure Evaluation (Addendum)
Anesthesia Evaluation  Patient identified by MRN, date of birth, ID band Patient awake    Reviewed: Patient's Chart, lab work & pertinent test results  History of Anesthesia Complications (+) PROLONGED EMERGENCE  Airway Mallampati: III  TM Distance: >3 FB Neck ROM: Full    Dental  (+) Teeth Intact, Dental Advisory Given   Pulmonary neg pulmonary ROS, former smoker,    Pulmonary exam normal        Cardiovascular hypertension, Pt. on medications + dysrhythmias Atrial Fibrillation  Rhythm:Irregular Rate:Normal     Neuro/Psych  Headaches, Anxiety    GI/Hepatic GERD  Medicated,Sarcoidosis   Endo/Other  negative endocrine ROS  Renal/GU CRFRenal disease     Musculoskeletal   Abdominal (+)  Abdomen: soft. Bowel sounds: normal.  Peds  Hematology  (+) anemia ,   Anesthesia Other Findings   Reproductive/Obstetrics                            Anesthesia Physical Anesthesia Plan  ASA: III  Anesthesia Plan: General   Post-op Pain Management:    Induction:   PONV Risk Score and Plan: 2 and Ondansetron and Dexamethasone  Airway Management Planned: Mask  Additional Equipment: None  Intra-op Plan:   Post-operative Plan:   Informed Consent: I have reviewed the patients History and Physical, chart, labs and discussed the procedure including the risks, benefits and alternatives for the proposed anesthesia with the patient or authorized representative who has indicated his/her understanding and acceptance.     Dental advisory given  Plan Discussed with: CRNA and Surgeon  Anesthesia Plan Comments: (Lab Results      Component                Value               Date                      WBC                      6.3                 08/17/2020                HGB                      10.5 (L)            08/27/2020                HCT                      31.0 (L)            08/27/2020                 MCV                      97                  08/17/2020                PLT                      165                 08/17/2020  ECHO 09/21: 1. Left ventricular ejection fraction, by estimation, is 55 to 60%. The left ventricle has normal function. The left ventricle has no regional wall motion abnormalities. There is mild left ventricular hypertrophy. Left ventricular diastolic parameters  are indeterminate. 2. Right ventricular systolic function is normal. The right ventricular size is normal. Tricuspid regurgitation signal is inadequate for assessing PA pressure. 3. Left atrial size was moderately dilated. 4. The mitral valve is degenerative. Trivial mitral valve regurgitation. The mean mitral valve gradient is 6.0 mmHg with average heart rate of 89 bpm. Severe mitral annular calcification. 5. The aortic valve is abnormal. There is mild calcification of the aortic valve. Aortic valve regurgitation is not visualized. Mild aortic valve stenosis. Aortic valve mean gradient measures 19.0 mmHg. 6. The inferior vena cava is normal in size with greater than 50% respiratory variability, suggesting right atrial pressure of 3 mmHg.)        Anesthesia Quick Evaluation

## 2020-08-27 NOTE — Discharge Instructions (Signed)
Electrical Cardioversion Electrical cardioversion is the delivery of a jolt of electricity to restore a normal rhythm to the heart. A rhythm that is too fast or is not regular keeps the heart from pumping well. In this procedure, sticky patches or metal paddles are placed on the chest to deliver electricity to the heart from a device. This procedure may be done in an emergency if:  There is low or no blood pressure as a result of the heart rhythm.  Normal rhythm must be restored as fast as possible to protect the brain and heart from further damage.  It may save a life. This may also be a scheduled procedure for irregular or fast heart rhythms that are not immediately life-threatening. Tell a health care provider about:  Any allergies you have.  All medicines you are taking, including vitamins, herbs, eye drops, creams, and over-the-counter medicines.  Any problems you or family members have had with anesthetic medicines.  Any blood disorders you have.  Any surgeries you have had.  Any medical conditions you have.  Whether you are pregnant or may be pregnant. What are the risks? Generally, this is a safe procedure. However, problems may occur, including:  Allergic reactions to medicines.  A blood clot that breaks free and travels to other parts of your body.  The possible return of an abnormal heart rhythm within hours or days after the procedure.  Your heart stopping (cardiac arrest). This is rare. What happens before the procedure? Medicines  Your health care provider may have you start taking: ? Blood-thinning medicines (anticoagulants) so your blood does not clot as easily. ? Medicines to help stabilize your heart rate and rhythm.  Ask your health care provider about: ? Changing or stopping your regular medicines. This is especially important if you are taking diabetes medicines or blood thinners. ? Taking medicines such as aspirin and ibuprofen. These medicines can  thin your blood. Do not take these medicines unless your health care provider tells you to take them. ? Taking over-the-counter medicines, vitamins, herbs, and supplements. General instructions  Follow instructions from your health care provider about eating or drinking restrictions.  Plan to have someone take you home from the hospital or clinic.  If you will be going home right after the procedure, plan to have someone with you for 24 hours.  Ask your health care provider what steps will be taken to help prevent infection. These may include washing your skin with a germ-killing soap. What happens during the procedure?   An IV will be inserted into one of your veins.  Sticky patches (electrodes) or metal paddles may be placed on your chest.  You will be given a medicine to help you relax (sedative).  An electrical shock will be delivered. The procedure may vary among health care providers and hospitals. What can I expect after the procedure?  Your blood pressure, heart rate, breathing rate, and blood oxygen level will be monitored until you leave the hospital or clinic.  Your heart rhythm will be watched to make sure it does not change.  You may have some redness on the skin where the shocks were given. Follow these instructions at home:  Do not drive for 24 hours if you were given a sedative during your procedure.  Take over-the-counter and prescription medicines only as told by your health care provider.  Ask your health care provider how to check your pulse. Check it often.  Rest for 48 hours after the procedure or   as told by your health care provider.  Avoid or limit your caffeine use as told by your health care provider.  Keep all follow-up visits as told by your health care provider. This is important. Contact a health care provider if:  You feel like your heart is beating too quickly or your pulse is not regular.  You have a serious muscle cramp that does not go  away. Get help right away if:  You have discomfort in your chest.  You are dizzy or you feel faint.  You have trouble breathing or you are short of breath.  Your speech is slurred.  You have trouble moving an arm or leg on one side of your body.  Your fingers or toes turn cold or blue. Summary  Electrical cardioversion is the delivery of a jolt of electricity to restore a normal rhythm to the heart.  This procedure may be done right away in an emergency or may be a scheduled procedure if the condition is not an emergency.  Generally, this is a safe procedure.  After the procedure, check your pulse often as told by your health care provider. This information is not intended to replace advice given to you by your health care provider. Make sure you discuss any questions you have with your health care provider. Document Revised: 06/17/2019 Document Reviewed: 06/17/2019 Elsevier Patient Education  2020 Elsevier Inc.  

## 2020-08-27 NOTE — Interval H&P Note (Signed)
History and Physical Interval Note:  08/27/2020 7:53 AM  [name]  has presented today for surgery, with the diagnosis of a-fib.  The various methods of treatment have been discussed with the patient and family. After consideration of risks, benefits and other options for treatment, the patient has consented to  Procedure(s): CARDIOVERSION (N/A) as a surgical intervention.  The patient's history has been reviewed, patient examined, no change in status, stable for surgery.  I have reviewed the patient's chart and labs.  Questions were answered to the patient's satisfaction.     Patient notes no changes since last visit.  Discussed NM Stress Results; notes years prior event pre dialysis with stabbing chest pain.  No further episodes  Brayten Komar A Retina Bernardy

## 2020-08-27 NOTE — Telephone Encounter (Signed)
----- Message from Werner Lean, MD sent at 08/27/2020  9:04 AM EDT ----- Regarding: FW: A fib Would love to get this guy to one of the EP partners for some long term management help.  Thanks!  ----- Message ----- From: Oliver Barre, PA Sent: 08/27/2020   8:41 AM EDT To: Werner Lean, MD Subject: RE: A fib                                      Hey, Glad he's back in SR. The referral to EP doesn't need to come through the AF clinic so you can go ahead and put in the referral if you'd like. I agree it's a tough situation starting antiarrhythmics with his other comorbidities.   Thanks ----- Message ----- From: Werner Lean, MD Sent: 08/27/2020   8:14 AM EDT To: Oliver Barre, PA Subject: RE: A Pam Drown,  Mr. G Is in Sinus Rhythm.  I think we might want to get him to EP-> I can't use 1c agents, He has renal failure, and I don't love using amio long term for him with the spectre of liver sarcoid.  His synthetic function is ok; so I'm going to start him on amio.  Can we use the AF clinic to get him to EP or should I refer him separately?  Thanks,  Mahesh  ----- Message ----- From: Oliver Barre, PA Sent: 08/18/2020   8:29 AM EDT To: Werner Lean, MD Subject: RE: A fib                                      Hi,  That sounds like a good plan. I put a 7 day monitor on him which showed 100% AF burden so I suspect he's been persistent this whole time. We usually see the patient one week after DCCV in the AF clinic to confirm they are still in [place]. We can reschedule our visit with him for one week after his DCCV on 9/30.   My other concern for him is his h/o sarcoid. With his extracardiac sarcoidosis (liver) and atrial arrhythmias, do you think he needs testing for cardiac sarcoid?  Thanks Audry Pili [phone] Cell ----- Message ----- From: Werner Lean, MD Sent: 08/17/2020   4:31 PM EDT To:  Oliver Barre, PA Subject: A fib                                          Hey Audry Pili,  This is Rudean Haskell, on of the new non-invasive docs.  Met Mr. Darnell Level today, and he is still in AF and reasonable symptomatic despite being rate controlled..  I'd like to attempt DCCV 08/27/20; I am getting TTE and stress to see his AAD options.  I saw that he was coming to see you on 08/24/20.  Didn't want to step on any toes; I'm happy to take the lead or defer to you all if that his historically what happens.  Thanks for your time, [facility]

## 2020-08-30 ENCOUNTER — Encounter (HOSPITAL_COMMUNITY): Payer: Self-pay | Admitting: Internal Medicine

## 2020-09-01 ENCOUNTER — Institutional Professional Consult (permissible substitution): Payer: BC Managed Care – PPO | Admitting: Cardiology

## 2020-09-02 ENCOUNTER — Ambulatory Visit (HOSPITAL_COMMUNITY): Payer: BC Managed Care – PPO | Admitting: Physician Assistant

## 2020-09-14 ENCOUNTER — Other Ambulatory Visit: Payer: Self-pay

## 2020-09-14 ENCOUNTER — Ambulatory Visit (INDEPENDENT_AMBULATORY_CARE_PROVIDER_SITE_OTHER): Payer: BC Managed Care – PPO | Admitting: Cardiology

## 2020-09-14 ENCOUNTER — Encounter: Payer: Self-pay | Admitting: Cardiology

## 2020-09-14 VITALS — BP 102/62 | HR 78 | Ht 74.0 in | Wt 311.2 lb

## 2020-09-14 DIAGNOSIS — N186 End stage renal disease: Secondary | ICD-10-CM | POA: Diagnosis not present

## 2020-09-14 DIAGNOSIS — I4819 Other persistent atrial fibrillation: Secondary | ICD-10-CM

## 2020-09-14 DIAGNOSIS — I10 Essential (primary) hypertension: Secondary | ICD-10-CM

## 2020-09-14 NOTE — Patient Instructions (Addendum)
Medication Instructions:  Your physician has recommended you make the following change in your medication:   1.  STOP DILTIAZEM  Lab Work: None ordered. If you have labs (blood work) drawn today and your tests are completely normal, you will receive your results only by: Marland Kitchen MyChart Message (if you have MyChart) OR . A paper copy in the mail If you have any lab test that is abnormal or we need to change your treatment, we will call you to review the results.  Testing/Procedures: None ordered.  Follow-Up: At [facility], you and your health needs are our priority.  As part of our continuing mission to provide you with exceptional heart care, we have created designated Provider Care Teams.  These Care Teams include your primary Cardiologist (physician) and Advanced Practice Providers (APPs -  Physician Assistants and Nurse Practitioners) who all work together to provide you with the care you need, when you need it.  Your next appointment:   Your physician wants you to follow-up in: 3 months with Dr. Quentin Ore.     December 17, 2019 at 1:15 pm at the [facility] office
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
## 2020-09-30 ENCOUNTER — Telehealth: Payer: Self-pay | Admitting: *Deleted

## 2020-09-30 NOTE — Telephone Encounter (Signed)
===  View-only below this line=== ----- Message ----- From: Consuelo Pandy Sent: 09/28/2020   4:37 PM EDT To: Oda Kilts, CMA Subject: RE: GI Hx from June 2021                       Hi Dorena Dorfman I was able to speak with the patient. He understands his records are needed for approval. Patient was provided  [facility] fax # [phone] to send records.  Patient is aware appt is cancelled for 10/05/20.Marland Kitchen Patient was informed after records are received/reviewed appt will be scheduled according to doctor's recommendation.  Thank you ----- Message ----- From: Oda Kilts, CMA Sent: 09/28/2020   4:11 PM EDT To: Consuelo Pandy Subject: RE: GI Hx from June 2021                       Marva, You scheduled this patient with Dr Silverio Decamp, no approval was done by Dr Silverio Decamp Colon was done back in June of this year  Can you check on this  ----- Message ----- From: Mauri Pole, MD Sent: 09/28/2020   1:26 PM EDT To: Oda Kilts, CMA Subject: RE: GI Hx from June 2021                       Please check why patient wants to switch here, looks like he is closely followed by [facility] ----- Message ----- From: Oda Kilts, CMA Sent: 09/28/2020  10:25 AM EDT To: Mauri Pole, MD Subject: GI Hx from June 2021                           I do not see that this patient was approved by a physician here, He just had a colon at [facility] in June 2021, He is on your schedule for 11/8

## 2020-10-05 ENCOUNTER — Telehealth (HOSPITAL_COMMUNITY): Payer: Self-pay | Admitting: *Deleted

## 2020-10-05 ENCOUNTER — Ambulatory Visit: Payer: BC Managed Care – PPO | Admitting: Gastroenterology

## 2020-10-05 NOTE — Telephone Encounter (Signed)
Pt called stating he went into AFib on Friday afternoon - HRs in the 110s. Currently 117. Pt unable to check BP right now due to not being at home. Discussed with Adline Peals PA will increase Amiodarone to 200mg  twice a day and call with update on Thursday. Pt was recently taken off diltiazem daily due to hypotension.

## 2020-10-12 NOTE — Telephone Encounter (Signed)
Pt continues in AF. Will bring in for assessment this week.

## 2020-10-13 ENCOUNTER — Emergency Department (HOSPITAL_COMMUNITY): Payer: Medicare Other

## 2020-10-13 ENCOUNTER — Encounter (HOSPITAL_COMMUNITY): Payer: Self-pay

## 2020-10-13 ENCOUNTER — Other Ambulatory Visit: Payer: Self-pay

## 2020-10-13 ENCOUNTER — Inpatient Hospital Stay (HOSPITAL_COMMUNITY)
Admission: EM | Admit: 2020-10-13 | Discharge: 2020-10-15 | DRG: 091 | Disposition: A | Discharge status: Home / Self Care | Payer: Medicare Other | Attending: Internal Medicine | Admitting: Internal Medicine

## 2020-10-13 DIAGNOSIS — Z9049 Acquired absence of other specified parts of digestive tract: Secondary | ICD-10-CM | POA: Diagnosis not present

## 2020-10-13 DIAGNOSIS — Z992 Dependence on renal dialysis: Secondary | ICD-10-CM

## 2020-10-13 DIAGNOSIS — G928 Other toxic encephalopathy: Secondary | ICD-10-CM | POA: Diagnosis present

## 2020-10-13 DIAGNOSIS — I4892 Unspecified atrial flutter: Secondary | ICD-10-CM | POA: Diagnosis present

## 2020-10-13 DIAGNOSIS — Z825 Family history of asthma and other chronic lower respiratory diseases: Secondary | ICD-10-CM

## 2020-10-13 DIAGNOSIS — G9341 Metabolic encephalopathy: Secondary | ICD-10-CM | POA: Diagnosis not present

## 2020-10-13 DIAGNOSIS — M549 Dorsalgia, unspecified: Secondary | ICD-10-CM | POA: Diagnosis present

## 2020-10-13 DIAGNOSIS — I120 Hypertensive chronic kidney disease with stage 5 chronic kidney disease or end stage renal disease: Secondary | ICD-10-CM | POA: Diagnosis present

## 2020-10-13 DIAGNOSIS — R4182 Altered mental status, unspecified: Secondary | ICD-10-CM

## 2020-10-13 DIAGNOSIS — Z83438 Family history of other disorder of lipoprotein metabolism and other lipidemia: Secondary | ICD-10-CM | POA: Diagnosis not present

## 2020-10-13 DIAGNOSIS — Z79899 Other long term (current) drug therapy: Secondary | ICD-10-CM

## 2020-10-13 DIAGNOSIS — N186 End stage renal disease: Secondary | ICD-10-CM | POA: Diagnosis present

## 2020-10-13 DIAGNOSIS — R000 Tachycardia, unspecified: Secondary | ICD-10-CM | POA: Diagnosis present

## 2020-10-13 DIAGNOSIS — G8929 Other chronic pain: Secondary | ICD-10-CM | POA: Diagnosis present

## 2020-10-13 DIAGNOSIS — Z888 Allergy status to other drugs, medicaments and biological substances status: Secondary | ICD-10-CM

## 2020-10-13 DIAGNOSIS — I509 Heart failure, unspecified: Secondary | ICD-10-CM | POA: Diagnosis present

## 2020-10-13 DIAGNOSIS — Z6841 Body Mass Index (BMI) 40.0 and over, adult: Secondary | ICD-10-CM | POA: Diagnosis not present

## 2020-10-13 DIAGNOSIS — D631 Anemia in chronic kidney disease: Secondary | ICD-10-CM | POA: Diagnosis present

## 2020-10-13 DIAGNOSIS — Z833 Family history of diabetes mellitus: Secondary | ICD-10-CM

## 2020-10-13 DIAGNOSIS — Z7901 Long term (current) use of anticoagulants: Secondary | ICD-10-CM

## 2020-10-13 DIAGNOSIS — T424X5A Adverse effect of benzodiazepines, initial encounter: Secondary | ICD-10-CM | POA: Diagnosis present

## 2020-10-13 DIAGNOSIS — Z7982 Long term (current) use of aspirin: Secondary | ICD-10-CM

## 2020-10-13 DIAGNOSIS — Y929 Unspecified place or not applicable: Secondary | ICD-10-CM | POA: Diagnosis not present

## 2020-10-13 DIAGNOSIS — Z87891 Personal history of nicotine dependence: Secondary | ICD-10-CM

## 2020-10-13 DIAGNOSIS — Z8249 Family history of ischemic heart disease and other diseases of the circulatory system: Secondary | ICD-10-CM

## 2020-10-13 DIAGNOSIS — D8689 Sarcoidosis of other sites: Secondary | ICD-10-CM | POA: Diagnosis present

## 2020-10-13 DIAGNOSIS — I480 Paroxysmal atrial fibrillation: Secondary | ICD-10-CM | POA: Diagnosis present

## 2020-10-13 DIAGNOSIS — Z20822 Contact with and (suspected) exposure to covid-19: Secondary | ICD-10-CM | POA: Diagnosis present

## 2020-10-13 DIAGNOSIS — I10 Essential (primary) hypertension: Secondary | ICD-10-CM | POA: Diagnosis present

## 2020-10-13 DIAGNOSIS — K219 Gastro-esophageal reflux disease without esophagitis: Secondary | ICD-10-CM | POA: Diagnosis present

## 2020-10-13 HISTORY — DX: Unspecified atrial fibrillation: I48.91

## 2020-10-13 LAB — CBC WITH DIFFERENTIAL/PLATELET
Abs Immature Granulocytes: 0.03 10*3/uL (ref 0.00–0.07)
Basophils Absolute: 0 10*3/uL (ref 0.0–0.1)
Basophils Relative: 1 %
Eosinophils Absolute: 0.1 10*3/uL (ref 0.0–0.5)
Eosinophils Relative: 1 %
HCT: 32.9 % — ABNORMAL LOW (ref 39.0–52.0)
Hemoglobin: 10.3 g/dL — ABNORMAL LOW (ref 13.0–17.0)
Immature Granulocytes: 1 %
Lymphocytes Relative: 17 %
Lymphs Abs: 0.7 10*3/uL (ref 0.7–4.0)
MCH: 32.3 pg (ref 26.0–34.0)
MCHC: 31.3 g/dL (ref 30.0–36.0)
MCV: 103.1 fL — ABNORMAL HIGH (ref 80.0–100.0)
Monocytes Absolute: 0.4 10*3/uL (ref 0.1–1.0)
Monocytes Relative: 10 %
Neutro Abs: 3 10*3/uL (ref 1.7–7.7)
Neutrophils Relative %: 70 %
Platelets: 149 10*3/uL — ABNORMAL LOW (ref 150–400)
RBC: 3.19 MIL/uL — ABNORMAL LOW (ref 4.22–5.81)
RDW: 13 % (ref 11.5–15.5)
WBC: 4.3 10*3/uL (ref 4.0–10.5)
nRBC: 0 % (ref 0.0–0.2)

## 2020-10-13 LAB — AMMONIA: Ammonia: 39 umol/L — ABNORMAL HIGH (ref 9–35)

## 2020-10-13 LAB — COMPREHENSIVE METABOLIC PANEL
ALT: 14 U/L (ref 0–44)
AST: 16 U/L (ref 15–41)
Albumin: 4 g/dL (ref 3.5–5.0)
Alkaline Phosphatase: 66 U/L (ref 38–126)
Anion gap: 14 (ref 5–15)
BUN: 51 mg/dL — ABNORMAL HIGH (ref 6–20)
CO2: 26 mmol/L (ref 22–32)
Calcium: 9.8 mg/dL (ref 8.9–10.3)
Chloride: 101 mmol/L (ref 98–111)
Creatinine, Ser: 8.75 mg/dL — ABNORMAL HIGH (ref 0.61–1.24)
GFR, Estimated: 7 mL/min — ABNORMAL LOW (ref >60)
Glucose, Bld: 110 mg/dL — ABNORMAL HIGH (ref 70–99)
Potassium: 4.2 mmol/L (ref 3.5–5.1)
Sodium: 141 mmol/L (ref 135–145)
Total Bilirubin: 0.3 mg/dL (ref 0.3–1.2)
Total Protein: 7.3 g/dL (ref 6.5–8.1)

## 2020-10-13 LAB — I-STAT VENOUS BLOOD GAS, ED
Acid-Base Excess: 3 mmol/L — ABNORMAL HIGH (ref 0.0–2.0)
Bicarbonate: 29.3 mmol/L — ABNORMAL HIGH (ref 20.0–28.0)
Calcium, Ion: 1.15 mmol/L (ref 1.15–1.40)
HCT: 32 % — ABNORMAL LOW (ref 39.0–52.0)
Hemoglobin: 10.9 g/dL — ABNORMAL LOW (ref 13.0–17.0)
O2 Saturation: 99 %
Potassium: 4.2 mmol/L (ref 3.5–5.1)
Sodium: 140 mmol/L (ref 135–145)
TCO2: 31 mmol/L (ref 22–32)
pCO2, Ven: 53.5 mmHg (ref 44.0–60.0)
pH, Ven: 7.347 (ref 7.250–7.430)
pO2, Ven: 140 mmHg — ABNORMAL HIGH (ref 32.0–45.0)

## 2020-10-13 LAB — TSH: TSH: 3.805 u[IU]/mL (ref 0.350–4.500)

## 2020-10-13 LAB — RAPID URINE DRUG SCREEN, HOSP PERFORMED
Amphetamines: NOT DETECTED
Barbiturates: NOT DETECTED
Benzodiazepines: POSITIVE — AB
Cocaine: NOT DETECTED
Opiates: POSITIVE — AB
Tetrahydrocannabinol: NOT DETECTED

## 2020-10-13 LAB — LIPASE, BLOOD: Lipase: 33 U/L (ref 11–51)

## 2020-10-13 LAB — SALICYLATE LEVEL: Salicylate Lvl: <7 mg/dL — ABNORMAL LOW (ref 7.0–30.0)

## 2020-10-13 LAB — RESPIRATORY PANEL BY RT PCR (FLU A&B, COVID)
Influenza A by PCR: NEGATIVE
Influenza B by PCR: NEGATIVE
SARS Coronavirus 2 by RT PCR: NEGATIVE

## 2020-10-13 LAB — ACETAMINOPHEN LEVEL: Acetaminophen (Tylenol), Serum: <10 ug/mL — ABNORMAL LOW (ref 10–30)

## 2020-10-13 LAB — LACTIC ACID, PLASMA: Lactic Acid, Venous: 1.5 mmol/L (ref 0.5–1.9)

## 2020-10-13 LAB — ETHANOL: Alcohol, Ethyl (B): <10 mg/dL (ref <10)

## 2020-10-13 MED ORDER — CHLORHEXIDINE GLUCONATE CLOTH 2 % EX PADS
6.0000 | MEDICATED_PAD | Freq: Every day | CUTANEOUS | Status: DC
  Administered 2020-10-14: 6 via TOPICAL

## 2020-10-13 MED ORDER — CINACALCET HCL 30 MG PO TABS
180.0000 mg | ORAL_TABLET | Freq: Every day | ORAL | Status: DC
  Administered 2020-10-13 – 2020-10-14 (×2): 180 mg via ORAL
  Filled 2020-10-13 (×2): qty 6

## 2020-10-13 MED ORDER — LABETALOL HCL 5 MG/ML IV SOLN: 10.0000 mg | INTRAVENOUS | Status: DC | PRN

## 2020-10-13 MED ORDER — HYDRALAZINE HCL 25 MG PO TABS: 25.0000 mg | ORAL_TABLET | ORAL | Status: DC | PRN

## 2020-10-13 MED ORDER — FERRIC CITRATE 1 GM 210 MG(FE) PO TABS
840.0000 mg | ORAL_TABLET | Freq: Three times a day (TID) | ORAL | Status: DC
  Administered 2020-10-14 (×2): 840 mg via ORAL
  Filled 2020-10-13 (×4): qty 4

## 2020-10-13 MED ORDER — SODIUM CHLORIDE 0.9% FLUSH
3.0000 mL | Freq: Two times a day (BID) | INTRAVENOUS | Status: DC
  Administered 2020-10-14 (×2): 3 mL via INTRAVENOUS
  Administered 2020-10-14: 2.5 mL via INTRAVENOUS
  Administered 2020-10-15: 3 mL via INTRAVENOUS

## 2020-10-13 MED ORDER — HEPARIN SODIUM (PORCINE) 1000 UNIT/ML IJ SOLN
INTRAMUSCULAR | Status: AC
  Administered 2020-10-13: 4000 [IU]
  Filled 2020-10-13: qty 4

## 2020-10-13 MED ORDER — DOXERCALCIFEROL 4 MCG/2ML IV SOLN
5.0000 ug | INTRAVENOUS | Status: DC
  Administered 2020-10-15: 5 ug via INTRAVENOUS
  Filled 2020-10-13: qty 4

## 2020-10-13 NOTE — ED Triage Notes (Signed)
Pt BIB EMS from dialysis center with c/O AMS. Dialysis center called bc pt drove to the facility but was sitting in his car for awhile. The staffed decided to check on him and called EMS for possible stroke. EMS reports no stroke like s/s. Per EMS pt just seemed drowsy. Pt told EMS he didn't go to sleep till 2am. Pt didn't receive treatment,> Pt is Tues,thurs,sat. Pt denies taking any sedative meds. Pt denies drinking alcohol. Pt is sleepy on arrival.

## 2020-10-13 NOTE — Hospital Course (Signed)
Mr. Steven Best is a 54 yo CM with PMH ESRD on HD TThSa (last session Sat), hypertension, A. fib (on Eliquis), anxiety who presents to the hospital with altered mental status.  Patient was reportedly sitting in the parking lot in his car at his dialysis center when staff came out to get him and noticed that he appeared confused/altered. EMS was called and patient was brought to the ER for further evaluation.  EMS noted that the patient "seems drowsy". He is chronically on hydrocodone/acetaminophen 10-325 mg (last filled 09/22/2020), clonazepam 1 mg (last filled 09/13/2020).   On evaluation in the ER he was notably lethargic but had no obvious dysarthria, weakness, sensation changes.  Denied any headaches.  He was afebrile.  Vitals were otherwise stable.  During monitoring in the ER he did start to develop some elevated diastolic blood pressures (90-100s).  He was able to answer most questions but at times did have difficulty remembering some answers such as when his dialysis days were; but he eventually got the right answer after talking it out repetitively.  Tylenol level negative.  Salicylate level negative. CMP consistent with underlying ESRD (BUN 51, creatinine 8.75) Ethanol negative Lipase 33 Lactic acid 1.5 NH3 39 TSH 3.805 VBG: 7.34/53 UDS positive for opiates and benzos.  Of note, patient does state that he took his Norco and clonazepam today.  Due to his lethargy and slightly altered mentation, he is admitted for further monitoring, probable dialysis, and hopeful washout of sedating agents.

## 2020-10-13 NOTE — Assessment & Plan Note (Signed)
-  Patient states last dialysis was on Saturday.  He was in the parking lot today for his session but was sent to the ER for his lethargy/confusion -Nephrology consulted in ER -Continue chronic HD while in hospital

## 2020-10-13 NOTE — Assessment & Plan Note (Signed)
-   Suspect multifactorial in setting of recent benzos and opiates with underlying ESRD.  Suspect he has accumulation of medication effect.  No other toxins noted on lab work-up.  Ammonia level slightly elevated (no asterixis on exam, no underlying cirrhosis as seen on last CT August 2019) -Nephrology consulted for dialysis, possibly today -Hold further sedating agents -Hold off on fluids given underlying ESRD.  He states he does not make much urine -Repeat labs in a.m.

## 2020-10-13 NOTE — Assessment & Plan Note (Signed)
-  Typically well controlled outpatient.  Diastolic starting to elevate while in the ER -Resume home meds after med rec complete -Use labetalol or hydralazine as needed

## 2020-10-13 NOTE — ED Notes (Signed)
Eating  Wire at bedside
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
## 2020-10-13 NOTE — ED Notes (Signed)
Pt is very drowsy but is A&Ox4 once awaken.
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
## 2020-10-13 NOTE — Assessment & Plan Note (Signed)
-  Follows outpatient with cardiology.  Last seen on 09/14/2020.  His Cardizem was stopped at his last visit.  He continues on amiodarone and Eliquis.  Of note, he was also not considered a good ablation candidate due to his underlying comorbidities, obesity, and ESRD on HD -Await med rec, then resume home meds
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
## 2020-10-14 ENCOUNTER — Inpatient Hospital Stay (HOSPITAL_COMMUNITY): Payer: Medicare Other

## 2020-10-14 ENCOUNTER — Ambulatory Visit (HOSPITAL_COMMUNITY): Payer: BC Managed Care – PPO | Admitting: Physician Assistant

## 2020-10-14 DIAGNOSIS — G9341 Metabolic encephalopathy: Secondary | ICD-10-CM | POA: Diagnosis not present

## 2020-10-14 LAB — HIV ANTIBODY (ROUTINE TESTING W REFLEX): HIV Screen 4th Generation wRfx: NONREACTIVE

## 2020-10-14 LAB — VITAMIN B12: Vitamin B-12: 262 pg/mL (ref 180–914)

## 2020-10-14 MED ORDER — OXYCODONE HCL 5 MG PO TABS
5.0000 mg | ORAL_TABLET | Freq: Four times a day (QID) | ORAL | Status: DC | PRN
  Administered 2020-10-14: 5 mg via ORAL
  Filled 2020-10-14: qty 1

## 2020-10-14 MED ORDER — ACETAMINOPHEN 500 MG PO TABS: 500.0000 mg | ORAL_TABLET | Freq: Four times a day (QID) | ORAL | Status: DC | PRN

## 2020-10-14 MED ORDER — AMIODARONE HCL 200 MG PO TABS
200.0000 mg | ORAL_TABLET | Freq: Two times a day (BID) | ORAL | Status: DC
  Administered 2020-10-14 – 2020-10-15 (×2): 200 mg via ORAL
  Filled 2020-10-14 (×2): qty 1

## 2020-10-14 MED ORDER — AMIODARONE HCL 200 MG PO TABS
200.0000 mg | ORAL_TABLET | Freq: Every day | ORAL | Status: DC
  Administered 2020-10-14: 200 mg via ORAL
  Filled 2020-10-14: qty 1

## 2020-10-14 MED ORDER — ACETAMINOPHEN 325 MG PO TABS
650.0000 mg | ORAL_TABLET | Freq: Four times a day (QID) | ORAL | Status: DC | PRN
  Administered 2020-10-14: 650 mg via ORAL
  Filled 2020-10-14: qty 2

## 2020-10-14 MED ORDER — VITAMIN B-12 100 MCG PO TABS
100.0000 ug | ORAL_TABLET | Freq: Every day | ORAL | Status: DC
  Administered 2020-10-14 – 2020-10-15 (×2): 100 ug via ORAL
  Filled 2020-10-14 (×2): qty 1

## 2020-10-14 MED ORDER — CLONAZEPAM 0.5 MG PO TABS
0.5000 mg | ORAL_TABLET | ORAL | Status: DC
  Administered 2020-10-15: 0.5 mg via ORAL
  Filled 2020-10-14: qty 1

## 2020-10-14 MED ORDER — LACTULOSE 10 GM/15ML PO SOLN
10.0000 g | Freq: Every day | ORAL | Status: DC
  Administered 2020-10-14 – 2020-10-15 (×2): 10 g via ORAL
  Filled 2020-10-14 (×2): qty 15

## 2020-10-14 MED ORDER — ACETAMINOPHEN 325 MG PO TABS: 650.0000 mg | ORAL_TABLET | Freq: Four times a day (QID) | ORAL | Status: DC | PRN

## 2020-10-14 MED ORDER — APIXABAN 5 MG PO TABS
5.0000 mg | ORAL_TABLET | Freq: Two times a day (BID) | ORAL | Status: DC
  Administered 2020-10-14 – 2020-10-15 (×3): 5 mg via ORAL
  Filled 2020-10-14 (×3): qty 1

## 2020-10-14 MED ORDER — HYDROCODONE-ACETAMINOPHEN 5-325 MG PO TABS
1.0000 | ORAL_TABLET | Freq: Four times a day (QID) | ORAL | Status: DC | PRN
  Administered 2020-10-14: 2 via ORAL
  Administered 2020-10-14: 1 via ORAL
  Filled 2020-10-14 (×2): qty 2

## 2020-10-14 NOTE — Progress Notes (Signed)
   [facility] Progress Note   Subjective:  Completed dialysis last night - net UF 3L  Remembers yesterday's events. Alert, oriented.  C/o chronic back pain   Objective Vitals:   10/14/20 0040 10/14/20 0122 10/14/20 0541 10/14/20 0837  BP: 131/66 124/69 (!) 108/48 (!) 91/58  Pulse: 87 89 84 94  Resp: 18 18 18 18   Temp: 98.2 F (36.8 C) 98 F (36.7 C) 98 F (36.7 C) 98.5 F (36.9 C)  TempSrc: Oral   Oral  SpO2: 97% 98% 100% 100%  Weight:         Additional Objective Labs: Basic Metabolic Panel: Recent Labs  Lab 10/13/20 0817 10/13/20 0903  NA 141 140  K 4.2 4.2  CL 101  --   CO2 26  --   GLUCOSE 110*  --   BUN 51*  --   CREATININE 8.75*  --   CALCIUM 9.8  --    CBC: Recent Labs  Lab 10/13/20 0817 10/13/20 0903  WBC 4.3  --   NEUTROABS 3.0  --   HGB 10.3* 10.9*  HCT 32.9* 32.0*  MCV 103.1*  --   PLT 149*  --    Blood Culture    Component Value Date/Time   SDES URINE, CLEAN CATCH 09/19/2012 1434   SPECREQUEST NONE 09/19/2012 1434   CULT NO GROWTH 09/19/2012 1434   REPTSTATUS 09/20/2012 FINAL 09/19/2012 1434     Physical Exam General: WNWD nad Heart: RRR Lungs: Clear bilaterally  Abdomen: soft non-tender  Extremities: No LE edema  Dialysis Access: L AVF +bruit   Medications:  . amiodarone  200 mg Oral Daily  . apixaban  5 mg Oral BID  . Chlorhexidine Gluconate Cloth  6 each Topical Q0600  . cinacalcet  180 mg Oral Q supper  . [START ON 10/15/2020] doxercalciferol  5 mcg Intravenous Q T,Th,Sa-HD  . ferric citrate  840 mg Oral TID WC  . lactulose  10 g Oral Daily  . sodium chloride flush  3 mL Intravenous Q12H    Dialysis Orders:  [place] [facility]    4h 77min  450/800   136.5kg  2/2 bath  AVF  Hep 5000+ 3000 mid  hectorol 5ug   No esa  Assessment/Plan: 1. AMS/Drowsiness-  No acute findings on Head CT/MRI. A&O x3. Not uremic on admission. ?polypharmacy Appears improved today  2. ESRD -  HD TTS. Next HD 11/18 on schedule.  3.  HTN/volume- BP soft. Had pulm edema on CXR. Net UF 3L with HD 11/16. Attempt another 3-3.5L next HD  4. Anemia-  Hgb 10.9 No ESA indicated  5. MBD - Continue binders, Hectorol.  6. Atrial fib - per primary MD. On Eliquis   Lynnda Child PA-C [facility] 10/14/2020,11:20 AM

## 2020-10-14 NOTE — Progress Notes (Signed)
OT Cancellation Note  Patient Details Name: Steven Best MRN: [id number] DOB: October 23, 1966   Cancelled Treatment:    Reason Eval/Treat Not Completed: OT screened, no needs identified, will sign off (Pt back to baseline level with ADL and mobility. OT D/C order.) Pt reports that he is cognitively back to baseline and has been up walking in hallway and to and from the bathroom without assistance. Pt does not require OT eval at this time.   OT signing off. Thank you for this referral.  [name], OTR/L Acute Rehabilitation Services Pager: [phone] Office: [phone]   [name] 10/14/2020, 5:07 PM
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
## 2020-10-14 NOTE — Progress Notes (Signed)
PT Cancellation Note  Patient Details Name: Steven Best MRN: [id number] DOB: 04-16-1966   Cancelled Treatment:    Reason Eval/Treat Not Completed: Other (comment).  Pt reports he is up walking with no issues, and will not need further PT.  Asked pt to let nursing know if he changes his mind.  Nursing aware of his decision.   Ramond Dial 10/14/2020, 12:30 PM   Mee Hives, PT MS Acute Rehab Dept. Number: [place] and [place]

## 2020-10-14 NOTE — Progress Notes (Signed)
Pt returned to unit from MRI.

## 2020-10-14 NOTE — Progress Notes (Signed)
Patient off unit to MRI.

## 2020-10-14 NOTE — Progress Notes (Signed)
PROGRESS NOTE    Steven Best  MCN:[id number] DOB: [birth year] DOA: 10/13/2020 PCP: Shirline Frees, MD   Brief Narrative: 54 year old with past medical history significant for ESRD on hemodialysis Tuesday Thursday and Saturday, hypertension, A. fib on Eliquis, anxiety who presents to the hospital with altered mental status.  Patient was reportedly sitting in the parking lot in his car at his dialysis center when staff came out to get him and noticed that he appeared confused and altered. EMS was called and patient was brought to the ER for further evaluation.  EMS noted that the patient seems drowsy. He is chronically on hydrocodone acetaminophen 08/30/2024 and clonazepam.  On evaluation in the ER he was notably lethargic but had no obvious dysarthria, weakness, sensation changes.  Denies any headache. Evaluation so far Tylenol level negative, salicylate negative ethanol negative, UDS positive for opiates and benzos.  Assessment & Plan:   Principal Problem:   Acute metabolic encephalopathy Active Problems:   HTN (hypertension)   End stage renal disease (HCC)   Paroxysmal atrial fibrillation (HCC)   1-Acute metabolic encephalopathy: Could be related to polypharmacy, with underlying ESRD. Continue to hold Flexeril. He relates that he takes Klonopin only prior to hemodialysis will resume lower dose. Will resume home dose hydrocodone and observe in the hospital We will get MRI to rule out a stroke due to his history of A. Fib  2-ESRD. Nephrology following, patient had hemodialysis last night  Paroxysmal A. Fib: Resume amiodarone and Eliquis. Reports episode of tachycardia at home symptomatic.  Cardiology has been consulted  Hypertension: Has history of low blood pressure monitor  Estimated body mass index is 39.97 kg/m as calculated from the following:   Height as of 09/14/20: 6\' 2"  (1.88 m).   Weight as of this encounter: 141.2 kg.   DVT prophylaxis: Eliquis Code  Status: Full code Family Communication: Care discussed with patient Disposition Plan:  Status is: Inpatient  Remains inpatient appropriate because:Ongoing diagnostic testing needed not appropriate for outpatient work up   Dispo: The patient is from: Home              Anticipated d/c is to: Home              Anticipated d/c date is: 1 day              Patient currently is not medically stable to d/c.        Consultants:   Cardiology  Nephrology  Procedures:   Hemodialysis  Antimicrobials:    Subjective: He Is complaining of back pain, he has been taking hydrocodone for a long period of time, he has never gotten lethargic. Takes Klonopin just prior to hemodialysis.  No new medications. He reports elevated heart rate at home.  He has an appointment today with the A. fib clinic.  Objective: Vitals:   10/14/20 0040 10/14/20 0122 10/14/20 0541 10/14/20 0837  BP: 131/66 124/69 (!) 108/48 (!) 91/58  Pulse: 87 89 84 94  Resp: 18 18 18 18   Temp: 98.2 F (36.8 C) 98 F (36.7 C) 98 F (36.7 C) 98.5 F (36.9 C)  TempSrc: Oral   Oral  SpO2: 97% 98% 100% 100%  Weight:        Intake/Output Summary (Last 24 hours) at 10/14/2020 0853 Last data filed at 10/14/2020 0040 Gross per 24 hour  Intake --  Output 3000 ml  Net -3000 ml   Filed Weights   10/13/20 2023  Weight: (!) 141.2 kg  Examination:  General exam: Appears calm and comfortable  Respiratory system: Clear to auscultation. Respiratory effort normal. Cardiovascular system: S1 & S2 heard, RRR. No JVD, murmurs, rubs, gallops or clicks. No pedal edema. Gastrointestinal system: Abdomen is nondistended, soft and nontender. No organomegaly or masses felt. Normal bowel sounds heard. Central nervous system: Alert and oriented. No focal neurological deficits. Extremities: Symmetric 5 x 5 power. Skin: No rashes, lesions or ulcers Psychiatry: Judgement and insight appear normal. Mood & affect appropriate.      Data Reviewed: I have personally reviewed following labs and imaging studies  CBC: Recent Labs  Lab 10/13/20 0817 10/13/20 0903  WBC 4.3  --   NEUTROABS 3.0  --   HGB 10.3* 10.9*  HCT 32.9* 32.0*  MCV 103.1*  --   PLT 149*  --    Basic Metabolic Panel: Recent Labs  Lab 10/13/20 0817 10/13/20 0903  NA 141 140  K 4.2 4.2  CL 101  --   CO2 26  --   GLUCOSE 110*  --   BUN 51*  --   CREATININE 8.75*  --   CALCIUM 9.8  --    GFR: Estimated Creatinine Clearance: 14.3 mL/min (A) (by C-G formula based on SCr of 8.75 mg/dL (H)). Liver Function Tests: Recent Labs  Lab 10/13/20 0817  AST 16  ALT 14  ALKPHOS 66  BILITOT 0.3  PROT 7.3  ALBUMIN 4.0   Recent Labs  Lab 10/13/20 0817  LIPASE 33   Recent Labs  Lab 10/13/20 0817  AMMONIA 39*   Coagulation Profile: No results for input(s): INR, PROTIME in the last 168 hours. Cardiac Enzymes: No results for input(s): CKTOTAL, CKMB, CKMBINDEX, TROPONINI in the last 168 hours. BNP (last 3 results) No results for input(s): PROBNP in the last 8760 hours. HbA1C: No results for input(s): HGBA1C in the last 72 hours. CBG: No results for input(s): GLUCAP in the last 168 hours. Lipid Profile: No results for input(s): CHOL, HDL, LDLCALC, TRIG, CHOLHDL, LDLDIRECT in the last 72 hours. Thyroid Function Tests: Recent Labs    10/13/20 0817  TSH 3.805   Anemia Panel: No results for input(s): VITAMINB12, FOLATE, FERRITIN, TIBC, IRON, RETICCTPCT in the last 72 hours. Sepsis Labs: Recent Labs  Lab 10/13/20 0817  LATICACIDVEN 1.5    Recent Results (from the past 240 hour(s))  Respiratory Panel by RT PCR (Flu A&B, Covid) - Nasopharyngeal Swab     Status: None   Collection Time: 10/13/20 11:25 AM   Specimen: Nasopharyngeal Swab  Result Value Ref Range Status   SARS Coronavirus 2 by RT PCR NEGATIVE NEGATIVE Final    Comment: (NOTE) SARS-CoV-2 target nucleic acids are NOT DETECTED.  The SARS-CoV-2 RNA is generally  detectable in upper respiratoy specimens during the acute phase of infection. The lowest concentration of SARS-CoV-2 viral copies this assay can detect is 131 copies/mL. A negative result does not preclude SARS-Cov-2 infection and should not be used as the sole basis for treatment or other patient management decisions. A negative result may occur with  improper specimen collection/handling, submission of specimen other than nasopharyngeal swab, presence of viral mutation(s) within the areas targeted by this assay, and inadequate number of viral copies (<131 copies/mL). A negative result must be combined with clinical observations, patient history, and epidemiological information. The expected result is Negative.  Fact Sheet for Patients:  PinkCheek.be  Fact Sheet for Healthcare Providers:  GravelBags.it  This test is no t yet approved or cleared by the [place]  States FDA and  has been authorized for detection and/or diagnosis of SARS-CoV-2 by FDA under an Emergency Use Authorization (EUA). This EUA will remain  in effect (meaning this test can be used) for the duration of the COVID-19 declaration under Section 564(b)(1) of the Act, 21 U.S.C. section 360bbb-3(b)(1), unless the authorization is terminated or revoked sooner.     Influenza A by PCR NEGATIVE NEGATIVE Final   Influenza B by PCR NEGATIVE NEGATIVE Final    Comment: (NOTE) The Xpert Xpress SARS-CoV-2/FLU/RSV assay is intended as an aid in  the diagnosis of influenza from Nasopharyngeal swab specimens and  should not be used as a sole basis for treatment. Nasal washings and  aspirates are unacceptable for Xpert Xpress SARS-CoV-2/FLU/RSV  testing.  Fact Sheet for Patients: PinkCheek.be  Fact Sheet for Healthcare Providers: GravelBags.it  This test is not yet approved or cleared by the [place] FDA and    has been authorized for detection and/or diagnosis of SARS-CoV-2 by  FDA under an Emergency Use Authorization (EUA). This EUA will remain  in effect (meaning this test can be used) for the duration of the  Covid-19 declaration under Section 564(b)(1) of the Act, 21  U.S.C. section 360bbb-3(b)(1), unless the authorization is  terminated or revoked. Performed at [facility], [place] [address], [place], [place] [address]          Radiology Studies: CT Head Wo Contrast  Result Date: 10/13/2020 CLINICAL DATA:  Mental status change of unknown cause in a 54 year old male EXAM: CT HEAD WITHOUT CONTRAST TECHNIQUE: Contiguous axial images were obtained from the base of the skull through the vertex without intravenous contrast. COMPARISON:  July 17, 2018 FINDINGS: Brain: No evidence of acute infarction, hemorrhage, hydrocephalus, extra-axial collection or mass lesion/mass effect. Vascular: No hyperdense vessel or unexpected calcification. Skull: Normal. Negative for fracture or focal lesion. Sinuses/Orbits: Visualized paranasal sinuses and orbits are normal. Other: None. IMPRESSION: No acute intracranial abnormality. Electronically Signed   By: Zetta Bills M.D.   On: 10/13/2020 11:24   DG Chest Portable 1 View  Result Date: 10/13/2020 CLINICAL DATA:  Altered mental status. In stage renal disease, on dialysis. EXAM: PORTABLE CHEST 1 VIEW COMPARISON:  07/17/2018 FINDINGS: Enlarged cardiac silhouette with an interval increase in size. Increased prominence of the pulmonary vasculature and interstitial markings. No pleural fluid. Mild scoliosis. IMPRESSION: Interval cardiomegaly and mild changes of congestive heart failure. Electronically Signed   By: Claudie Revering M.D.   On: 10/13/2020 11:44        Scheduled Meds: . amiodarone  200 mg Oral Daily  . apixaban  5 mg Oral BID  . Chlorhexidine Gluconate Cloth  6 each Topical Q0600  . cinacalcet  180 mg Oral Q supper  . [START ON 10/15/2020]  doxercalciferol  5 mcg Intravenous Q T,Th,Sa-HD  . ferric citrate  840 mg Oral TID WC  . lactulose  10 g Oral Daily  . sodium chloride flush  3 mL Intravenous Q12H   Continuous Infusions:   LOS: 1 day    Time spent: 35 minutes    Danely Bayliss A Marvene Strohm, MD [facility]   If 7PM-7AM, please contact night-coverage [email or web address]  10/14/2020, 8:53 AM

## 2020-10-14 NOTE — Discharge Instructions (Signed)

## 2020-10-15 DIAGNOSIS — G9341 Metabolic encephalopathy: Secondary | ICD-10-CM | POA: Diagnosis not present

## 2020-10-15 LAB — RENAL FUNCTION PANEL
Albumin: 3.6 g/dL (ref 3.5–5.0)
Anion gap: 12 (ref 5–15)
BUN: 39 mg/dL — ABNORMAL HIGH (ref 6–20)
CO2: 25 mmol/L (ref 22–32)
Calcium: 8.2 mg/dL — ABNORMAL LOW (ref 8.9–10.3)
Chloride: 97 mmol/L — ABNORMAL LOW (ref 98–111)
Creatinine, Ser: 7.17 mg/dL — ABNORMAL HIGH (ref 0.61–1.24)
GFR, Estimated: 8 mL/min — ABNORMAL LOW (ref >60)
Glucose, Bld: 98 mg/dL (ref 70–99)
Phosphorus: 4.2 mg/dL (ref 2.5–4.6)
Potassium: 4 mmol/L (ref 3.5–5.1)
Sodium: 134 mmol/L — ABNORMAL LOW (ref 135–145)

## 2020-10-15 LAB — CBC
HCT: 30.3 % — ABNORMAL LOW (ref 39.0–52.0)
Hemoglobin: 9.6 g/dL — ABNORMAL LOW (ref 13.0–17.0)
MCH: 31.4 pg (ref 26.0–34.0)
MCHC: 31.7 g/dL (ref 30.0–36.0)
MCV: 99 fL (ref 80.0–100.0)
Platelets: 137 10*3/uL — ABNORMAL LOW (ref 150–400)
RBC: 3.06 MIL/uL — ABNORMAL LOW (ref 4.22–5.81)
RDW: 13 % (ref 11.5–15.5)
WBC: 4.5 10*3/uL (ref 4.0–10.5)
nRBC: 0 % (ref 0.0–0.2)

## 2020-10-15 LAB — HEPATITIS B SURFACE ANTIGEN: Hepatitis B Surface Ag: NEGATIVE — AB

## 2020-10-15 LAB — HEPATITIS B SURFACE ANTIBODY, QUANTITATIVE: Hep B S AB Quant (Post): <3.1 m[IU]/mL — ABNORMAL LOW (ref >9.9)

## 2020-10-15 MED ORDER — HYDROCODONE-ACETAMINOPHEN 5-325 MG PO TABS
ORAL_TABLET | ORAL | Status: AC
  Administered 2020-10-15: 1 via ORAL
  Filled 2020-10-15: qty 1

## 2020-10-15 MED ORDER — CYANOCOBALAMIN 100 MCG PO TABS: 100.0000 ug | ORAL_TABLET | Freq: Every day | ORAL | 0 refills | Status: AC

## 2020-10-15 MED ORDER — LIDOCAINE-PRILOCAINE 2.5-2.5 % EX CREA: 1.0000 "application " | TOPICAL_CREAM | CUTANEOUS | Status: DC | PRN

## 2020-10-15 MED ORDER — SODIUM CHLORIDE 0.9 % IV SOLN: 100.0000 mL | INTRAVENOUS | Status: DC | PRN

## 2020-10-15 MED ORDER — LIDOCAINE HCL (PF) 1 % IJ SOLN: 5.0000 mL | INTRAMUSCULAR | Status: DC | PRN

## 2020-10-15 MED ORDER — PENTAFLUOROPROP-TETRAFLUOROETH EX AERO: 1.0000 "application " | INHALATION_SPRAY | CUTANEOUS | Status: DC | PRN

## 2020-10-15 MED ORDER — HEPARIN SODIUM (PORCINE) 1000 UNIT/ML DIALYSIS: 1000.0000 [IU] | INTRAMUSCULAR | Status: DC | PRN

## 2020-10-15 MED ORDER — ALTEPLASE 2 MG IJ SOLR: 2.0000 mg | Freq: Once | INTRAMUSCULAR | Status: DC | PRN

## 2020-10-15 MED ORDER — HEPARIN SODIUM (PORCINE) 1000 UNIT/ML DIALYSIS
4000.0000 [IU] | INTRAMUSCULAR | Status: DC | PRN
  Administered 2020-10-15: 4000 [IU] via INTRAVENOUS_CENTRAL

## 2020-10-15 MED ORDER — CLONAZEPAM 0.5 MG PO TABS
0.5000 mg | ORAL_TABLET | ORAL | 0 refills | Status: DC
Start: 2020-10-15 — End: 2021-05-12

## 2020-10-15 MED ORDER — DOXERCALCIFEROL 4 MCG/2ML IV SOLN
INTRAVENOUS | Status: AC
  Filled 2020-10-15: qty 4

## 2020-10-15 MED ORDER — HEPARIN SODIUM (PORCINE) 1000 UNIT/ML DIALYSIS: 5000.0000 [IU] | Freq: Once | INTRAMUSCULAR | Status: DC

## 2020-10-15 MED ORDER — HEPARIN SODIUM (PORCINE) 1000 UNIT/ML IJ SOLN
INTRAMUSCULAR | Status: AC
  Filled 2020-10-15: qty 4

## 2020-10-15 MED ORDER — AMIODARONE HCL 200 MG PO TABS
200.0000 mg | ORAL_TABLET | Freq: Two times a day (BID) | ORAL | 1 refills | Status: DC
Start: 2020-10-15 — End: 2020-12-16

## 2020-10-15 NOTE — Progress Notes (Signed)
  [facility] Progress Note   Subjective:  Seen in HD unit. Tolerating UF.  Feels ok this am. No cp, sob.  Likely going home today   Objective Vitals:   10/14/20 2154 10/15/20 0452 10/15/20 0703 10/15/20 0708  BP: 110/79 (!) 91/50    Pulse: 91 75  82  Resp: 18 18  20   Temp: 97.9 F (36.6 C) 98 F (36.7 C)  97.6 F (36.4 C)  TempSrc: Oral Oral  Oral  SpO2: 98% 97%  99%  Weight:   (!) 139.1 kg      Additional Objective Labs: Basic Metabolic Panel: Recent Labs  Lab 10/13/20 0817 10/13/20 0903  NA 141 140  K 4.2 4.2  CL 101  --   CO2 26  --   GLUCOSE 110*  --   BUN 51*  --   CREATININE 8.75*  --   CALCIUM 9.8  --    CBC: Recent Labs  Lab 10/13/20 0817 10/13/20 0903  WBC 4.3  --   NEUTROABS 3.0  --   HGB 10.3* 10.9*  HCT 32.9* 32.0*  MCV 103.1*  --   PLT 149*  --    Blood Culture    Component Value Date/Time   SDES URINE, CLEAN CATCH 09/19/2012 1434   SPECREQUEST NONE 09/19/2012 1434   CULT NO GROWTH 09/19/2012 1434   REPTSTATUS 09/20/2012 FINAL 09/19/2012 1434     Physical Exam General: WNWD nad Heart: RRR soft systolic murmur  Lungs: Clear bilaterally  Abdomen: soft non-tender  Extremities: No LE edema  Dialysis Access: L AVF +bruit   Medications: . [START ON 10/16/2020] sodium chloride    . [START ON 10/16/2020] sodium chloride     . amiodarone  200 mg Oral BID  . apixaban  5 mg Oral BID  . Chlorhexidine Gluconate Cloth  6 each Topical Q0600  . cinacalcet  180 mg Oral Q supper  . clonazePAM  0.5 mg Oral Q T,Th,Sa-HD  . doxercalciferol  5 mcg Intravenous Q T,Th,Sa-HD  . ferric citrate  840 mg Oral TID WC  . [START ON 10/16/2020] heparin  5,000 Units Dialysis Once in dialysis  . heparin sodium (porcine)      . lactulose  10 g Oral Daily  . sodium chloride flush  3 mL Intravenous Q12H  . vitamin B-12  100 mcg Oral Daily    Dialysis Orders:  [facility]    4h 60min  450/800   136.5kg  2/2 bath  AVF  Hep 5000+ 3000 mid   hectorol 5ug   No esa  Assessment/Plan: 1. AMS/Drowsiness-  No acute findings on Head CT/MRI. A&O x3. Not uremic on admission. ?polypharmacy. Improved. Back to baseline MS  2. ESRD -  [facility] TTS. HD today on schedule.  3. HTN/volume- BP soft. Had pulm edema on CXR. Net UF 3L with HD 11/16. Attempt another 3-3.5L next HD  4. Anemia-  Hgb 10.9 No ESA indicated  5. MBD - Continue binders, Hectorol.  6. Atrial fib - per primary MD. On Eliquis/amiodarone. To f/u with EP as outpatient   Lynnda Child PA-C [facility] 10/15/2020,8:28 AM

## 2020-10-15 NOTE — Progress Notes (Signed)
DISCHARGE NOTE HOME  Steven Best to be discharged Home per MD order. Discussed prescriptions and follow up appointments with the patient. Prescriptions given to patient; medication list explained in detail. Patient verbalized understanding.  Skin clean, dry and intact without evidence of skin break down, no evidence of skin tears noted. IV catheter discontinued intact. Site without signs and symptoms of complications. Dressing and pressure applied. Pt denies pain at the site currently. No complaints noted.  Patient free of lines, drains, and wounds.   An After Visit Summary (AVS) was printed and given to the patient. Patient escorted via wheelchair, and discharged home via private auto.  [name], RN

## 2020-10-15 NOTE — Discharge Summary (Signed)
Physician Discharge Summary  Steven Best EXH:[id number] DOB: 09-12-1966 DOA: 10/13/2020  PCP: Shirline Frees, MD  Admit date: 10/13/2020 Discharge date: 10/15/2020  Admitted From: Home  Disposition:  Home   Recommendations for Outpatient Follow-up:  1. Follow up with PCP in 1-2 weeks 2. Please obtain BMP/CBC in one week 3. Further adjustment of pain management   Home Health: none Equipment/Devices: none  Discharge Condition: Stable.  CODE STATUS: Full code Diet recommendation: Heart Healthy   Brief/Interim Summary: 54 year old with past medical history significant for ESRD on hemodialysis Tuesday Thursday and Saturday, hypertension, A. fib on Eliquis, anxiety who presents to the hospital with altered mental status.  Patient was reportedly sitting in the parking lot in his car at his dialysis center when staff came out to get him and noticed that he appeared confused and altered. EMS was called and patient was brought to the ER for further evaluation.  EMS noted that the patient seems drowsy. He is chronically on hydrocodone acetaminophen 08/30/2024 and clonazepam.  On evaluation in the ER he was notably lethargic but had no obvious dysarthria, weakness, sensation changes.  Denies any headache. Evaluation so far Tylenol level negative, salicylate negative ethanol negative, UDS positive for opiates and benzos.   1-Acute metabolic encephalopathy: Could be related to polypharmacy, with underlying ESRD. Continue to hold Flexeril. He relates that he takes Klonopin only prior to hemodialysis will resume lower dose. Resume home dose hydrocodone and he was able to tolerates it.  MRI negative for stroke.  B 12 low normal, start  B 12.    2-ESRD. Nephrology following, Had HD today   Paroxysmal A. Fib: Resume amiodarone and Eliquis. Reports episode of tachycardia at home symptomatic.  Cardiology has been consulted Follow up 2 weeks with cardiology. .   Hypertension: Has  history of low blood pressure monitor   Discharge Diagnoses:  Principal Problem:   Acute metabolic encephalopathy Active Problems:   HTN (hypertension)   End stage renal disease (HCC)   Paroxysmal atrial fibrillation [facility])    Discharge Instructions  Discharge Instructions    Diet - low sodium heart healthy   Complete by: As directed    Increase activity slowly   Complete by: As directed      Allergies as of 10/15/2020      Reactions   Gemfibrozil Rash      Medication List    STOP taking these medications   cyclobenzaprine 10 MG tablet Commonly known as: FLEXERIL     TAKE these medications   acetaminophen 500 MG tablet Commonly known as: TYLENOL Take 500 mg by mouth every 6 (six) hours as needed for mild pain or headache.   amiodarone 200 MG tablet Commonly known as: Pacerone Take 1 tablet (200 mg total) by mouth 2 (two) times daily. What changed: when to take this   apixaban 5 MG Tabs tablet Commonly known as: ELIQUIS Take 1 tablet (5 mg total) by mouth 2 (two) times daily.   ARTIFICIAL TEAR SOLUTION OP Apply 1 drop to eye daily as needed (dry eyes).   aspirin-sod bicarb-citric acid 325 MG Tbef tablet Commonly known as: ALKA-SELTZER Take 650 mg by mouth every 6 (six) hours as needed (indigestion).   Auryxia 1 GM 210 MG(Fe) tablet Generic drug: ferric citrate Take 840 mg by mouth See admin instructions. Take 4 tablets (840 mg) by mouth with each meals & snacks   cinacalcet 90 MG tablet Commonly known as: SENSIPAR Take 180 mg by mouth daily.  clonazePAM 0.5 MG tablet Commonly known as: KLONOPIN Take 1 tablet (0.5 mg total) by mouth Every Tuesday,Thursday,and Saturday with dialysis. What changed:   medication strength  how much to take  when to take this  reasons to take this   cyanocobalamin 100 MCG tablet Take 1 tablet (100 mcg total) by mouth daily.   doxercalciferol 0.5 MCG capsule Commonly known as: HECTOROL Take 0.5 mcg by mouth in  the morning and at bedtime.   ethyl chloride spray Apply 1 application topically daily as needed (port access).   gabapentin 100 MG capsule Commonly known as: NEURONTIN Take 100 mg by mouth 3 (three) times daily as needed (pain).   HYDROcodone-acetaminophen 10-325 MG tablet Commonly known as: NORCO Take 1 tablet by mouth every 6 (six) hours as needed for severe pain. What changed: when to take this   hydroxypropyl methylcellulose / hypromellose 2.5 % ophthalmic solution Commonly known as: ISOPTO TEARS / GONIOVISC Place 1 drop into both eyes daily as needed for dry eyes.   lidocaine-prilocaine cream Commonly known as: EMLA Apply 1 application topically See admin instructions. Apply a small amount to skin prior to dialysis access 1/2-1 hr prior to each dialysis treatment.   multivitamin with minerals Tabs tablet Take 1 tablet by mouth daily.       Allergies  Allergen Reactions  . Gemfibrozil Rash    Consultations: Cardiology Nephrology   Procedures/Studies: CT Head Wo Contrast  Result Date: 10/13/2020 CLINICAL DATA:  Mental status change of unknown cause in a 54 year old male EXAM: CT HEAD WITHOUT CONTRAST TECHNIQUE: Contiguous axial images were obtained from the base of the skull through the vertex without intravenous contrast. COMPARISON:  July 17, 2018 FINDINGS: Brain: No evidence of acute infarction, hemorrhage, hydrocephalus, extra-axial collection or mass lesion/mass effect. Vascular: No hyperdense vessel or unexpected calcification. Skull: Normal. Negative for fracture or focal lesion. Sinuses/Orbits: Visualized paranasal sinuses and orbits are normal. Other: None. IMPRESSION: No acute intracranial abnormality. Electronically Signed   By: Zetta Bills M.D.   On: 10/13/2020 11:24   MR BRAIN WO CONTRAST  Result Date: 10/14/2020 CLINICAL DATA:  Mental status change, unknown cause. Additional history provided: Onset of confusion/lethargy, found in car at dialysis.  EXAM: MRI HEAD WITHOUT CONTRAST TECHNIQUE: Multiplanar, multiecho pulse sequences of the brain and surrounding structures were obtained without intravenous contrast. COMPARISON:  Noncontrast head CT 10/13/2020 FINDINGS: Brain: Mild intermittent motion degradation. Mild generalized cerebral atrophy. Mild multifocal T2/FLAIR hyperintensity within the cerebral white matter is nonspecific, but compatible with chronic small vessel ischemic disease. There is no acute infarct. No evidence of intracranial mass. No chronic intracranial blood products. No extra-axial fluid collection. No midline shift. Vascular: Expected proximal arterial flow voids. Skull and upper cervical spine: No focal marrow lesion. Sinuses/Orbits: Visualized orbits show no acute finding. Minimal ethmoid sinus mucosal thickening. Trace fluid within the bilateral mastoid air cells. IMPRESSION: Mildly motion degraded exam. No evidence of acute intracranial abnormality, including acute infarction. Mild cerebral atrophy and chronic small vessel ischemic disease. Electronically Signed   By: Kellie Simmering DO   On: 10/14/2020 10:22   DG Chest Portable 1 View  Result Date: 10/13/2020 CLINICAL DATA:  Altered mental status. In stage renal disease, on dialysis. EXAM: PORTABLE CHEST 1 VIEW COMPARISON:  07/17/2018 FINDINGS: Enlarged cardiac silhouette with an interval increase in size. Increased prominence of the pulmonary vasculature and interstitial markings. No pleural fluid. Mild scoliosis. IMPRESSION: Interval cardiomegaly and mild changes of congestive heart failure. Electronically Signed   By:  Claudie Revering M.D.   On: 10/13/2020 11:44    Subjective: Alert and oriented, back to baseline  Discharge Exam: Vitals:   10/15/20 1116 10/15/20 1149  BP:  105/65  Pulse: 84 77  Resp: 18 20  Temp: 97.7 F (36.5 C) 97.9 F (36.6 C)  SpO2: 99% 98%     General: Pt is alert, awake, not in acute distress Cardiovascular: RRR, S1/S2 +, no rubs, no  gallops Respiratory: CTA bilaterally, no wheezing, no rhonchi Abdominal: Soft, NT, ND, bowel sounds + Extremities: no edema, no cyanosis    The results of significant diagnostics from this hospitalization (including imaging, microbiology, ancillary and laboratory) are listed below for reference.     Microbiology: Recent Results (from the past 240 hour(s))  Respiratory Panel by RT PCR (Flu A&B, Covid) - Nasopharyngeal Swab     Status: None   Collection Time: 10/13/20 11:25 AM   Specimen: Nasopharyngeal Swab  Result Value Ref Range Status   SARS Coronavirus 2 by RT PCR NEGATIVE NEGATIVE Final    Comment: (NOTE) SARS-CoV-2 target nucleic acids are NOT DETECTED.  The SARS-CoV-2 RNA is generally detectable in upper respiratoy specimens during the acute phase of infection. The lowest concentration of SARS-CoV-2 viral copies this assay can detect is 131 copies/mL. A negative result does not preclude SARS-Cov-2 infection and should not be used as the sole basis for treatment or other patient management decisions. A negative result may occur with  improper specimen collection/handling, submission of specimen other than nasopharyngeal swab, presence of viral mutation(s) within the areas targeted by this assay, and inadequate number of viral copies (<131 copies/mL). A negative result must be combined with clinical observations, patient history, and epidemiological information. The expected result is Negative.  Fact Sheet for Patients:  PinkCheek.be  Fact Sheet for Healthcare Providers:  GravelBags.it  This test is no t yet approved or cleared by the [place] FDA and  has been authorized for detection and/or diagnosis of SARS-CoV-2 by FDA under an Emergency Use Authorization (EUA). This EUA will remain  in effect (meaning this test can be used) for the duration of the COVID-19 declaration under Section 564(b)(1) of the Act,  21 U.S.C. section 360bbb-3(b)(1), unless the authorization is terminated or revoked sooner.     Influenza A by PCR NEGATIVE NEGATIVE Final   Influenza B by PCR NEGATIVE NEGATIVE Final    Comment: (NOTE) The Xpert Xpress SARS-CoV-2/FLU/RSV assay is intended as an aid in  the diagnosis of influenza from Nasopharyngeal swab specimens and  should not be used as a sole basis for treatment. Nasal washings and  aspirates are unacceptable for Xpert Xpress SARS-CoV-2/FLU/RSV  testing.  Fact Sheet for Patients: PinkCheek.be  Fact Sheet for Healthcare Providers: GravelBags.it  This test is not yet approved or cleared by the [place] FDA and  has been authorized for detection and/or diagnosis of SARS-CoV-2 by  FDA under an Emergency Use Authorization (EUA). This EUA will remain  in effect (meaning this test can be used) for the duration of the  Covid-19 declaration under Section 564(b)(1) of the Act, 21  U.S.C. section 360bbb-3(b)(1), unless the authorization is  terminated or revoked. Performed at [facility], [place] [address] [place], [place] [address]      Labs: BNP (last 3 results) No results for input(s): BNP in the last 8760 hours. Basic Metabolic Panel: Recent Labs  Lab 10/13/20 0817 10/13/20 0903 10/15/20 0742  NA 141 140 134*  K 4.2 4.2 4.0  CL 101  --  97*  CO2 26  --  25  GLUCOSE 110*  --  98  BUN 51*  --  39*  CREATININE 8.75*  --  7.17*  CALCIUM 9.8  --  8.2*  PHOS  --   --  4.2   Liver Function Tests: Recent Labs  Lab 10/13/20 0817 10/15/20 0742  AST 16  --   ALT 14  --   ALKPHOS 66  --   BILITOT 0.3  --   PROT 7.3  --   ALBUMIN 4.0 3.6   Recent Labs  Lab 10/13/20 0817  LIPASE 33   Recent Labs  Lab 10/13/20 0817  AMMONIA 39*   CBC: Recent Labs  Lab 10/13/20 0817 10/13/20 0903 10/15/20 0800  WBC 4.3  --  4.5  NEUTROABS 3.0  --   --   HGB 10.3* 10.9* 9.6*  HCT 32.9*  32.0* 30.3*  MCV 103.1*  --  99.0  PLT 149*  --  137*   Cardiac Enzymes: No results for input(s): CKTOTAL, CKMB, CKMBINDEX, TROPONINI in the last 168 hours. BNP: Invalid input(s): POCBNP CBG: No results for input(s): GLUCAP in the last 168 hours. D-Dimer No results for input(s): DDIMER in the last 72 hours. Hgb A1c No results for input(s): HGBA1C in the last 72 hours. Lipid Profile No results for input(s): CHOL, HDL, LDLCALC, TRIG, CHOLHDL, LDLDIRECT in the last 72 hours. Thyroid function studies Recent Labs    10/13/20 0817  TSH 3.805   Anemia work up Recent Labs    10/14/20 1037  VITAMINB12 262   Urinalysis    Component Value Date/Time   COLORURINE YELLOW 09/19/2012 [place] 09/19/2012 1434   LABSPEC 1.006 09/19/2012 1434   PHURINE 5.5 09/19/2012 1434   GLUCOSEU NEGATIVE 09/19/2012 1434   [place] 09/19/2012 1434   [place] 09/19/2012 1434   KETONESUR NEGATIVE 09/19/2012 1434   PROTEINUR NEGATIVE 09/19/2012 1434   UROBILINOGEN 0.2 09/19/2012 1434   NITRITE NEGATIVE 09/19/2012 1434   LEUKOCYTESUR NEGATIVE 09/19/2012 1434   Sepsis Labs Invalid input(s): PROCALCITONIN,  WBC,  LACTICIDVEN Microbiology Recent Results (from the past 240 hour(s))  Respiratory Panel by RT PCR (Flu A&B, Covid) - Nasopharyngeal Swab     Status: None   Collection Time: 10/13/20 11:25 AM   Specimen: Nasopharyngeal Swab  Result Value Ref Range Status   SARS Coronavirus 2 by RT PCR NEGATIVE NEGATIVE Final    Comment: (NOTE) SARS-CoV-2 target nucleic acids are NOT DETECTED.  The SARS-CoV-2 RNA is generally detectable in upper respiratoy specimens during the acute phase of infection. The lowest concentration of SARS-CoV-2 viral copies this assay can detect is 131 copies/mL. A negative result does not preclude SARS-Cov-2 infection and should not be used as the sole basis for treatment or other patient management decisions. A negative result may occur  with  improper specimen collection/handling, submission of specimen other than nasopharyngeal swab, presence of viral mutation(s) within the areas targeted by this assay, and inadequate number of viral copies (<131 copies/mL). A negative result must be combined with clinical observations, patient history, and epidemiological information. The expected result is Negative.  Fact Sheet for Patients:  PinkCheek.be  Fact Sheet for Healthcare Providers:  GravelBags.it  This test is no t yet approved or cleared by the [place] FDA and  has been authorized for detection and/or diagnosis of SARS-CoV-2 by FDA under an Emergency Use Authorization (EUA). This EUA will remain  in effect (meaning  this test can be used) for the duration of the COVID-19 declaration under Section 564(b)(1) of the Act, 21 U.S.C. section 360bbb-3(b)(1), unless the authorization is terminated or revoked sooner.     Influenza A by PCR NEGATIVE NEGATIVE Final   Influenza B by PCR NEGATIVE NEGATIVE Final    Comment: (NOTE) The Xpert Xpress SARS-CoV-2/FLU/RSV assay is intended as an aid in  the diagnosis of influenza from Nasopharyngeal swab specimens and  should not be used as a sole basis for treatment. Nasal washings and  aspirates are unacceptable for Xpert Xpress SARS-CoV-2/FLU/RSV  testing.  Fact Sheet for Patients: PinkCheek.be  Fact Sheet for Healthcare Providers: GravelBags.it  This test is not yet approved or cleared by the [place] FDA and  has been authorized for detection and/or diagnosis of SARS-CoV-2 by  FDA under an Emergency Use Authorization (EUA). This EUA will remain  in effect (meaning this test can be used) for the duration of the  Covid-19 declaration under Section 564(b)(1) of the Act, 21  U.S.C. section 360bbb-3(b)(1), unless the authorization is  terminated or  revoked. Performed at [facility], [place] [address], [place], [place] [address]      Time coordinating discharge: 40 minutes  SIGNED:   Elmarie Shiley, MD  [facility]

## 2020-10-16 ENCOUNTER — Telehealth: Payer: Self-pay | Admitting: Nephrology

## 2020-10-16 NOTE — Telephone Encounter (Signed)
Transition of care contact from inpatient facility  Date of discharge: 10/15/20 Date of contact: 10/16/20 Method: Phone Spoke to: Patient  Patient contacted to discuss transition of care from recent inpatient hospitalization. Patient was admitted to [facility] from 11/16-11/18/21  with discharge diagnosis of acute metabolic encephalopathy possibly related to polypharmacy   Medication changes were reviewed.  Patient will follow up with his/her outpatient HD unit on: Tomorrow 11/20.

## 2020-10-26 ENCOUNTER — Other Ambulatory Visit: Payer: Self-pay

## 2020-10-26 ENCOUNTER — Ambulatory Visit (HOSPITAL_COMMUNITY)
Admit: 2020-10-26 | Discharge: 2020-10-26 | Disposition: A | Discharge status: Home / Self Care | Payer: BC Managed Care – PPO | Source: Ambulatory Visit | Attending: Physician Assistant | Admitting: Physician Assistant

## 2020-10-26 ENCOUNTER — Encounter (HOSPITAL_COMMUNITY): Payer: Self-pay | Admitting: Physician Assistant

## 2020-10-26 VITALS — BP 112/66 | HR 93 | Ht 74.0 in | Wt 315.6 lb

## 2020-10-26 DIAGNOSIS — Z992 Dependence on renal dialysis: Secondary | ICD-10-CM | POA: Insufficient documentation

## 2020-10-26 DIAGNOSIS — I484 Atypical atrial flutter: Secondary | ICD-10-CM | POA: Insufficient documentation

## 2020-10-26 DIAGNOSIS — R7303 Prediabetes: Secondary | ICD-10-CM | POA: Insufficient documentation

## 2020-10-26 DIAGNOSIS — D6869 Other thrombophilia: Secondary | ICD-10-CM | POA: Insufficient documentation

## 2020-10-26 DIAGNOSIS — Z79899 Other long term (current) drug therapy: Secondary | ICD-10-CM | POA: Insufficient documentation

## 2020-10-26 DIAGNOSIS — E785 Hyperlipidemia, unspecified: Secondary | ICD-10-CM | POA: Diagnosis not present

## 2020-10-26 DIAGNOSIS — Z6841 Body Mass Index (BMI) 40.0 and over, adult: Secondary | ICD-10-CM | POA: Diagnosis not present

## 2020-10-26 DIAGNOSIS — Z87891 Personal history of nicotine dependence: Secondary | ICD-10-CM | POA: Diagnosis not present

## 2020-10-26 DIAGNOSIS — E669 Obesity, unspecified: Secondary | ICD-10-CM | POA: Diagnosis not present

## 2020-10-26 DIAGNOSIS — N186 End stage renal disease: Secondary | ICD-10-CM | POA: Diagnosis not present

## 2020-10-26 DIAGNOSIS — I120 Hypertensive chronic kidney disease with stage 5 chronic kidney disease or end stage renal disease: Secondary | ICD-10-CM | POA: Insufficient documentation

## 2020-10-26 DIAGNOSIS — Z8249 Family history of ischemic heart disease and other diseases of the circulatory system: Secondary | ICD-10-CM | POA: Diagnosis not present

## 2020-10-26 DIAGNOSIS — F4310 Post-traumatic stress disorder, unspecified: Secondary | ICD-10-CM | POA: Diagnosis not present

## 2020-10-26 DIAGNOSIS — Z7901 Long term (current) use of anticoagulants: Secondary | ICD-10-CM | POA: Diagnosis not present

## 2020-10-26 DIAGNOSIS — I4819 Other persistent atrial fibrillation: Secondary | ICD-10-CM

## 2020-10-26 LAB — CBC
HCT: 32.5 % — ABNORMAL LOW (ref 39.0–52.0)
Hemoglobin: 10.3 g/dL — ABNORMAL LOW (ref 13.0–17.0)
MCH: 31.9 pg (ref 26.0–34.0)
MCHC: 31.7 g/dL (ref 30.0–36.0)
MCV: 100.6 fL — ABNORMAL HIGH (ref 80.0–100.0)
Platelets: 133 10*3/uL — ABNORMAL LOW (ref 150–400)
RBC: 3.23 MIL/uL — ABNORMAL LOW (ref 4.22–5.81)
RDW: 12.9 % (ref 11.5–15.5)
WBC: 5.2 10*3/uL (ref 4.0–10.5)
nRBC: 0 % (ref 0.0–0.2)

## 2020-10-26 LAB — BASIC METABOLIC PANEL
Anion gap: 19 — ABNORMAL HIGH (ref 5–15)
BUN: 50 mg/dL — ABNORMAL HIGH (ref 6–20)
CO2: 23 mmol/L (ref 22–32)
Calcium: 9 mg/dL (ref 8.9–10.3)
Chloride: 95 mmol/L — ABNORMAL LOW (ref 98–111)
Creatinine, Ser: 8.18 mg/dL — ABNORMAL HIGH (ref 0.61–1.24)
GFR, Estimated: 7 mL/min — ABNORMAL LOW (ref >60)
Glucose, Bld: 100 mg/dL — ABNORMAL HIGH (ref 70–99)
Potassium: 4.2 mmol/L (ref 3.5–5.1)
Sodium: 137 mmol/L (ref 135–145)
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
## 2020-10-26 NOTE — Patient Instructions (Signed)
Cardioversion scheduled for Wednesday, December 8th  - Arrive at the [facility] and go to admitting at [facility] not eat or drink anything after midnight the night prior to your procedure.  - Take all your morning medication (except diabetic medications) with a sip of water prior to arrival.  - You will not be able to drive home after your procedure.  - Do NOT miss any doses of your blood thinner - if you should miss a dose please notify our office immediately.  - If you feel as if you go back into normal rhythm prior to scheduled cardioversion, please notify our office immediately. If your procedure is canceled in the cardioversion suite you will be charged a cancellation fee.  Continue Amiodarone 200mg  twice a day until cardioversion then reduce to 200mg  once a day

## 2020-10-28 ENCOUNTER — Encounter: Payer: Self-pay | Admitting: *Deleted

## 2020-10-30 ENCOUNTER — Ambulatory Visit: Payer: Medicare Other | Admitting: Neurology

## 2020-11-02 ENCOUNTER — Other Ambulatory Visit (HOSPITAL_COMMUNITY)
Admission: RE | Admit: 2020-11-02 | Discharge: 2020-11-02 | Disposition: A | Discharge status: Home / Self Care | Payer: Medicare Other | Source: Ambulatory Visit | Attending: Cardiology | Admitting: Cardiology

## 2020-11-02 DIAGNOSIS — Z01812 Encounter for preprocedural laboratory examination: Secondary | ICD-10-CM | POA: Diagnosis present

## 2020-11-02 DIAGNOSIS — Z20822 Contact with and (suspected) exposure to covid-19: Secondary | ICD-10-CM | POA: Diagnosis not present

## 2020-11-02 LAB — SARS CORONAVIRUS 2 (TAT 6-24 HRS): SARS Coronavirus 2: NEGATIVE

## 2020-11-03 ENCOUNTER — Telehealth: Payer: Self-pay | Admitting: Neurology

## 2020-11-03 ENCOUNTER — Ambulatory Visit (INDEPENDENT_AMBULATORY_CARE_PROVIDER_SITE_OTHER): Payer: Medicare Other | Admitting: Neurology

## 2020-11-03 ENCOUNTER — Encounter: Payer: Self-pay | Admitting: Neurology

## 2020-11-03 VITALS — BP 128/72 | HR 80 | Ht 74.0 in | Wt 320.0 lb

## 2020-11-03 DIAGNOSIS — M542 Cervicalgia: Secondary | ICD-10-CM | POA: Insufficient documentation
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
## 2020-11-03 NOTE — Telephone Encounter (Signed)
Medicare/BCBS auth: [employer] spoke to Vanita Ingles Ref # [id number] order sent to GI. They will reach out to the patient to schedule.

## 2020-11-03 NOTE — Patient Instructions (Signed)
[facility]    Address: [address], [place], [place] [address]  Phone: [phone]   

## 2020-11-04 ENCOUNTER — Other Ambulatory Visit: Payer: Self-pay

## 2020-11-04 ENCOUNTER — Ambulatory Visit (HOSPITAL_COMMUNITY): Payer: Medicare Other | Admitting: Certified Registered"

## 2020-11-04 ENCOUNTER — Encounter (HOSPITAL_COMMUNITY): Payer: Self-pay | Admitting: Cardiology

## 2020-11-04 ENCOUNTER — Ambulatory Visit (HOSPITAL_COMMUNITY)
Admission: RE | Admit: 2020-11-04 | Discharge: 2020-11-04 | Disposition: A | Discharge status: Home / Self Care | Payer: Medicare Other | Attending: Cardiology | Admitting: Cardiology

## 2020-11-04 ENCOUNTER — Encounter (HOSPITAL_COMMUNITY)
Admission: RE | Disposition: A | Discharge status: Home / Self Care | Payer: Self-pay | Source: Home / Self Care | Attending: Cardiology

## 2020-11-04 DIAGNOSIS — N186 End stage renal disease: Secondary | ICD-10-CM | POA: Diagnosis not present

## 2020-11-04 DIAGNOSIS — Z7901 Long term (current) use of anticoagulants: Secondary | ICD-10-CM | POA: Insufficient documentation

## 2020-11-04 DIAGNOSIS — D6869 Other thrombophilia: Secondary | ICD-10-CM | POA: Diagnosis not present

## 2020-11-04 DIAGNOSIS — I484 Atypical atrial flutter: Secondary | ICD-10-CM | POA: Diagnosis not present

## 2020-11-04 DIAGNOSIS — I120 Hypertensive chronic kidney disease with stage 5 chronic kidney disease or end stage renal disease: Secondary | ICD-10-CM | POA: Insufficient documentation

## 2020-11-04 DIAGNOSIS — Z87891 Personal history of nicotine dependence: Secondary | ICD-10-CM | POA: Diagnosis not present

## 2020-11-04 DIAGNOSIS — E669 Obesity, unspecified: Secondary | ICD-10-CM | POA: Diagnosis not present

## 2020-11-04 DIAGNOSIS — Z79899 Other long term (current) drug therapy: Secondary | ICD-10-CM | POA: Insufficient documentation

## 2020-11-04 DIAGNOSIS — I4819 Other persistent atrial fibrillation: Secondary | ICD-10-CM | POA: Diagnosis present

## 2020-11-04 DIAGNOSIS — Z992 Dependence on renal dialysis: Secondary | ICD-10-CM | POA: Insufficient documentation

## 2020-11-04 DIAGNOSIS — Z6841 Body Mass Index (BMI) 40.0 and over, adult: Secondary | ICD-10-CM | POA: Diagnosis not present

## 2020-11-04 HISTORY — PX: CARDIOVERSION: SHX1299

## 2020-11-04 LAB — POCT I-STAT, CHEM 8
BUN: 82 mg/dL — ABNORMAL HIGH (ref 6–20)
Calcium, Ion: 1.05 mmol/L — ABNORMAL LOW (ref 1.15–1.40)
Chloride: 101 mmol/L (ref 98–111)
Creatinine, Ser: 10.1 mg/dL — ABNORMAL HIGH (ref 0.61–1.24)
Glucose, Bld: 108 mg/dL — ABNORMAL HIGH (ref 70–99)
HCT: 30 % — ABNORMAL LOW (ref 39.0–52.0)
Hemoglobin: 10.2 g/dL — ABNORMAL LOW (ref 13.0–17.0)
Potassium: 5 mmol/L (ref 3.5–5.1)
Sodium: 136 mmol/L (ref 135–145)
TCO2: 24 mmol/L (ref 22–32)

## 2020-11-04 SURGERY — CARDIOVERSION
Anesthesia: General

## 2020-11-04 MED ORDER — PROPOFOL 10 MG/ML IV BOLUS
INTRAVENOUS | Status: DC | PRN
  Administered 2020-11-04: 100 mg via INTRAVENOUS

## 2020-11-04 MED ORDER — SODIUM CHLORIDE 0.9 % IV SOLN: INTRAVENOUS | Status: DC

## 2020-11-04 MED ORDER — LIDOCAINE 2% (20 MG/ML) 5 ML SYRINGE
INTRAMUSCULAR | Status: DC | PRN
  Administered 2020-11-04: 100 mg via INTRAVENOUS

## 2020-11-04 NOTE — Procedures (Signed)
Procedure: Electrical Cardioversion Indications:  Atrial Fibrillation  Procedure Details:  Consent: Risks of procedure as well as the alternatives and risks of each were explained to the (patient/caregiver).  Consent for procedure obtained.  Time Out: Verified patient identification, verified procedure, site/side was marked, verified correct patient position, special equipment/implants available, medications/allergies/relevent history reviewed, required imaging and test results available. PERFORMED.  Patient placed on cardiac monitor, pulse oximetry, supplemental oxygen as necessary.  Sedation given: Propofol 100mg ; lidocaine 100mg  Pacer pads placed anterior and posterior chest.  Cardioverted 3 time(s).  Cardioversion with synchronized biphasic 200J shock.  Evaluation: Findings: Post procedure EKG shows: Atrial Fibrillation Complications: None; remains in Afib Patient did tolerate procedure well.  Time Spent Directly with the Patient:  74minutes   Steven Best 11/04/2020, 11:32 AM

## 2020-11-04 NOTE — Interval H&P Note (Signed)
History and Physical Interval Note:  11/04/2020 10:33 AM  Steven Best  has presented today for surgery, with the diagnosis of AFIB.  The various methods of treatment have been discussed with the patient and family. After consideration of risks, benefits and other options for treatment, the patient has consented to  Procedure(s): CARDIOVERSION (N/A) as a surgical intervention.  The patient's history has been reviewed, patient examined, no change in status, stable for surgery.  I have reviewed the patient's chart and labs.  Questions were answered to the patient's satisfaction.     Freada Bergeron

## 2020-11-04 NOTE — Transfer of Care (Signed)
Immediate Anesthesia Transfer of Care Note  Patient: Steven Best  Procedure(s) Performed: CARDIOVERSION (N/A )  Patient Location: Endoscopy Unit  Anesthesia Type:General  Level of Consciousness: drowsy and patient cooperative  Airway & Oxygen Therapy: Patient Spontanous Breathing  Post-op Assessment: Report given to RN, Post -op Vital signs reviewed and stable and Patient moving all extremities  Post vital signs: Reviewed and stable  Last Vitals:  Vitals Value Taken Time  BP    Temp    Pulse 95 11/04/20 1130  Resp 23 11/04/20 1130  SpO2 92 % 11/04/20 1130  Vitals shown include unvalidated device data.  Last Pain:  Vitals:   11/04/20 1038  TempSrc: Temporal  PainSc: 0-No pain         Complications: No complications documented.

## 2020-11-04 NOTE — Anesthesia Preprocedure Evaluation (Signed)
Anesthesia Evaluation  Patient identified by MRN, date of birth, ID band Patient awake    Reviewed: Allergy & Precautions, Patient's Chart, lab work & pertinent test results  Airway Mallampati: II  TM Distance: >3 FB Neck ROM: Full    Dental no notable dental hx. (+) Teeth Intact, Dental Advisory Given   Pulmonary former smoker,    Pulmonary exam normal breath sounds clear to auscultation       Cardiovascular hypertension, Pt. on medications + dysrhythmias Atrial Fibrillation  Rhythm:Irregular Rate:Abnormal  08/20/20 Echo 1. Left ventricular ejection fraction, by estimation, is 55 to 60%. The  left ventricle has normal function. The left ventricle has no regional  wall motion abnormalities. There is mild left ventricular hypertrophy.  Left ventricular diastolic parameters  are indeterminate.    Neuro/Psych  Headaches, Anxiety    GI/Hepatic Neg liver ROS, GERD  ,  Endo/Other  negative endocrine ROS  Renal/GU K+ 4.2     Musculoskeletal negative musculoskeletal ROS (+)   Abdominal (+) + obese,   Peds  Hematology   Anesthesia Other Findings   Reproductive/Obstetrics negative OB ROS                             Anesthesia Physical Anesthesia Plan  ASA: III  Anesthesia Plan: General   Post-op Pain Management:    Induction: Intravenous  PONV Risk Score and Plan: Treatment may vary due to age or medical condition  Airway Management Planned: Nasal Cannula and Natural Airway  Additional Equipment: None  Intra-op Plan:   Post-operative Plan:   Informed Consent: I have reviewed the patients History and Physical, chart, labs and discussed the procedure including the risks, benefits and alternatives for the proposed anesthesia with the patient or authorized representative who has indicated his/her understanding and acceptance.     Dental advisory given  Plan Discussed with: CRNA and  Anesthesiologist  Anesthesia Plan Comments:         Anesthesia Quick Evaluation

## 2020-11-04 NOTE — Discharge Instructions (Signed)
Electrical Cardioversion Electrical cardioversion is the delivery of a jolt of electricity to restore a normal rhythm to the heart. A rhythm that is too fast or is not regular keeps the heart from pumping well. In this procedure, sticky patches or metal paddles are placed on the chest to deliver electricity to the heart from a device. This procedure may be done in an emergency if:  There is low or no blood pressure as a result of the heart rhythm.  Normal rhythm must be restored as fast as possible to protect the brain and heart from further damage.  It may save a life. This may also be a scheduled procedure for irregular or fast heart rhythms that are not immediately life-threatening. Tell a health care provider about:  Any allergies you have.  All medicines you are taking, including vitamins, herbs, eye drops, creams, and over-the-counter medicines.  Any problems you or family members have had with anesthetic medicines.  Any blood disorders you have.  Any surgeries you have had.  Any medical conditions you have.  Whether you are pregnant or may be pregnant. What are the risks? Generally, this is a safe procedure. However, problems may occur, including:  Allergic reactions to medicines.  A blood clot that breaks free and travels to other parts of your body.  The possible return of an abnormal heart rhythm within hours or days after the procedure.  Your heart stopping (cardiac arrest). This is rare. What happens before the procedure? Medicines  Your health care provider may have you start taking: ? Blood-thinning medicines (anticoagulants) so your blood does not clot as easily. ? Medicines to help stabilize your heart rate and rhythm.  Ask your health care provider about: ? Changing or stopping your regular medicines. This is especially important if you are taking diabetes medicines or blood thinners. ? Taking medicines such as aspirin and ibuprofen. These medicines can  thin your blood. Do not take these medicines unless your health care provider tells you to take them. ? Taking over-the-counter medicines, vitamins, herbs, and supplements. General instructions  Follow instructions from your health care provider about eating or drinking restrictions.  Plan to have someone take you home from the hospital or clinic.  If you will be going home right after the procedure, plan to have someone with you for 24 hours.  Ask your health care provider what steps will be taken to help prevent infection. These may include washing your skin with a germ-killing soap. What happens during the procedure?   An IV will be inserted into one of your veins.  Sticky patches (electrodes) or metal paddles may be placed on your chest.  You will be given a medicine to help you relax (sedative).  An electrical shock will be delivered. The procedure may vary among health care providers and hospitals. What can I expect after the procedure?  Your blood pressure, heart rate, breathing rate, and blood oxygen level will be monitored until you leave the hospital or clinic.  Your heart rhythm will be watched to make sure it does not change.  You may have some redness on the skin where the shocks were given. Follow these instructions at home:  Do not drive for 24 hours if you were given a sedative during your procedure.  Take over-the-counter and prescription medicines only as told by your health care provider.  Ask your health care provider how to check your pulse. Check it often.  Rest for 48 hours after the procedure or   as told by your health care provider.  Avoid or limit your caffeine use as told by your health care provider.  Keep all follow-up visits as told by your health care provider. This is important. Contact a health care provider if:  You feel like your heart is beating too quickly or your pulse is not regular.  You have a serious muscle cramp that does not go  away. Get help right away if:  You have discomfort in your chest.  You are dizzy or you feel faint.  You have trouble breathing or you are short of breath.  Your speech is slurred.  You have trouble moving an arm or leg on one side of your body.  Your fingers or toes turn cold or blue. Summary  Electrical cardioversion is the delivery of a jolt of electricity to restore a normal rhythm to the heart.  This procedure may be done right away in an emergency or may be a scheduled procedure if the condition is not an emergency.  Generally, this is a safe procedure.  After the procedure, check your pulse often as told by your health care provider. This information is not intended to replace advice given to you by your health care provider. Make sure you discuss any questions you have with your health care provider. Document Revised: 06/17/2019 Document Reviewed: 06/17/2019 Elsevier Patient Education  2020 Elsevier Inc.  

## 2020-11-04 NOTE — Anesthesia Postprocedure Evaluation (Signed)
Anesthesia Post Note  Patient: Steven Best  Procedure(s) Performed: CARDIOVERSION (N/A )     Patient location during evaluation: Endoscopy Anesthesia Type: General Level of consciousness: awake and alert Pain management: pain level controlled Vital Signs Assessment: post-procedure vital signs reviewed and stable Respiratory status: spontaneous breathing, nonlabored ventilation, respiratory function stable and patient connected to nasal cannula oxygen Cardiovascular status: blood pressure returned to baseline and stable Postop Assessment: no apparent nausea or vomiting Anesthetic complications: no   No complications documented.  Last Vitals:  Vitals:   11/04/20 1112 11/04/20 1131  BP: (!) 143/79   Pulse:  95  Resp:  (!) 23  Temp:  37.4 C  SpO2:  92%    Last Pain:  Vitals:   11/04/20 1038  TempSrc: Temporal  PainSc: 0-No pain                 Barnet Glasgow

## 2020-11-05 ENCOUNTER — Encounter (HOSPITAL_COMMUNITY): Payer: Self-pay | Admitting: Cardiology

## 2020-11-11 ENCOUNTER — Inpatient Hospital Stay (HOSPITAL_COMMUNITY)
Admission: RE | Admit: 2020-11-11 | Payer: BC Managed Care – PPO | Source: Ambulatory Visit | Admitting: Physician Assistant

## 2020-11-13 ENCOUNTER — Telehealth (HOSPITAL_COMMUNITY): Payer: Self-pay | Admitting: *Deleted

## 2020-11-13 ENCOUNTER — Ambulatory Visit (HOSPITAL_COMMUNITY): Payer: BC Managed Care – PPO | Admitting: Physician Assistant

## 2020-11-13 NOTE — Telephone Encounter (Signed)
Patient called to cancel dccv follow up stating since cardioversion didn't work and he has lost his insurance he would prefer to just follow up with Dr. Quentin Ore as scheduled in January. Discussed with Adline Peals PA will continue Amiodarone at 200mg  twice a day until follow up with Dr. Quentin Ore for rate control. Pt states his HR in dialysis yesterday was in the 90s.

## 2020-12-01 DIAGNOSIS — D689 Coagulation defect, unspecified: Secondary | ICD-10-CM | POA: Diagnosis not present

## 2020-12-01 DIAGNOSIS — Z992 Dependence on renal dialysis: Secondary | ICD-10-CM | POA: Diagnosis not present

## 2020-12-01 DIAGNOSIS — N2581 Secondary hyperparathyroidism of renal origin: Secondary | ICD-10-CM | POA: Diagnosis not present

## 2020-12-01 DIAGNOSIS — D631 Anemia in chronic kidney disease: Secondary | ICD-10-CM | POA: Diagnosis not present

## 2020-12-01 DIAGNOSIS — N186 End stage renal disease: Secondary | ICD-10-CM | POA: Diagnosis not present

## 2020-12-03 DIAGNOSIS — N2581 Secondary hyperparathyroidism of renal origin: Secondary | ICD-10-CM | POA: Diagnosis not present

## 2020-12-03 DIAGNOSIS — Z992 Dependence on renal dialysis: Secondary | ICD-10-CM | POA: Diagnosis not present

## 2020-12-03 DIAGNOSIS — D631 Anemia in chronic kidney disease: Secondary | ICD-10-CM | POA: Diagnosis not present

## 2020-12-03 DIAGNOSIS — N186 End stage renal disease: Secondary | ICD-10-CM | POA: Diagnosis not present

## 2020-12-03 DIAGNOSIS — D689 Coagulation defect, unspecified: Secondary | ICD-10-CM | POA: Diagnosis not present

## 2020-12-05 DIAGNOSIS — D689 Coagulation defect, unspecified: Secondary | ICD-10-CM | POA: Diagnosis not present

## 2020-12-05 DIAGNOSIS — N186 End stage renal disease: Secondary | ICD-10-CM | POA: Diagnosis not present

## 2020-12-05 DIAGNOSIS — D631 Anemia in chronic kidney disease: Secondary | ICD-10-CM | POA: Diagnosis not present

## 2020-12-05 DIAGNOSIS — N2581 Secondary hyperparathyroidism of renal origin: Secondary | ICD-10-CM | POA: Diagnosis not present

## 2020-12-05 DIAGNOSIS — Z992 Dependence on renal dialysis: Secondary | ICD-10-CM | POA: Diagnosis not present

## 2020-12-08 DIAGNOSIS — D631 Anemia in chronic kidney disease: Secondary | ICD-10-CM | POA: Diagnosis not present

## 2020-12-08 DIAGNOSIS — D689 Coagulation defect, unspecified: Secondary | ICD-10-CM | POA: Diagnosis not present

## 2020-12-08 DIAGNOSIS — Z992 Dependence on renal dialysis: Secondary | ICD-10-CM | POA: Diagnosis not present

## 2020-12-08 DIAGNOSIS — N186 End stage renal disease: Secondary | ICD-10-CM | POA: Diagnosis not present

## 2020-12-08 DIAGNOSIS — N2581 Secondary hyperparathyroidism of renal origin: Secondary | ICD-10-CM | POA: Diagnosis not present

## 2020-12-10 DIAGNOSIS — N2581 Secondary hyperparathyroidism of renal origin: Secondary | ICD-10-CM | POA: Diagnosis not present

## 2020-12-10 DIAGNOSIS — N186 End stage renal disease: Secondary | ICD-10-CM | POA: Diagnosis not present

## 2020-12-10 DIAGNOSIS — Z992 Dependence on renal dialysis: Secondary | ICD-10-CM | POA: Diagnosis not present

## 2020-12-10 DIAGNOSIS — D689 Coagulation defect, unspecified: Secondary | ICD-10-CM | POA: Diagnosis not present

## 2020-12-10 DIAGNOSIS — D631 Anemia in chronic kidney disease: Secondary | ICD-10-CM | POA: Diagnosis not present

## 2020-12-12 DIAGNOSIS — N186 End stage renal disease: Secondary | ICD-10-CM | POA: Diagnosis not present

## 2020-12-12 DIAGNOSIS — Z992 Dependence on renal dialysis: Secondary | ICD-10-CM | POA: Diagnosis not present

## 2020-12-12 DIAGNOSIS — D631 Anemia in chronic kidney disease: Secondary | ICD-10-CM | POA: Diagnosis not present

## 2020-12-12 DIAGNOSIS — N2581 Secondary hyperparathyroidism of renal origin: Secondary | ICD-10-CM | POA: Diagnosis not present

## 2020-12-12 DIAGNOSIS — D689 Coagulation defect, unspecified: Secondary | ICD-10-CM | POA: Diagnosis not present

## 2020-12-15 DIAGNOSIS — D689 Coagulation defect, unspecified: Secondary | ICD-10-CM | POA: Diagnosis not present

## 2020-12-15 DIAGNOSIS — Z992 Dependence on renal dialysis: Secondary | ICD-10-CM | POA: Diagnosis not present

## 2020-12-15 DIAGNOSIS — N186 End stage renal disease: Secondary | ICD-10-CM | POA: Diagnosis not present

## 2020-12-15 DIAGNOSIS — N2581 Secondary hyperparathyroidism of renal origin: Secondary | ICD-10-CM | POA: Diagnosis not present

## 2020-12-15 DIAGNOSIS — D631 Anemia in chronic kidney disease: Secondary | ICD-10-CM | POA: Diagnosis not present

## 2020-12-16 ENCOUNTER — Other Ambulatory Visit: Payer: Self-pay

## 2020-12-16 ENCOUNTER — Encounter: Payer: Self-pay | Admitting: Cardiology

## 2020-12-16 ENCOUNTER — Ambulatory Visit: Payer: Medicare Other | Admitting: Cardiology

## 2020-12-16 VITALS — BP 196/80 | HR 123 | Ht 74.0 in | Wt 325.0 lb

## 2020-12-16 DIAGNOSIS — I10 Essential (primary) hypertension: Secondary | ICD-10-CM

## 2020-12-16 DIAGNOSIS — I4819 Other persistent atrial fibrillation: Secondary | ICD-10-CM | POA: Diagnosis not present

## 2020-12-16 MED ORDER — WARFARIN SODIUM 5 MG PO TABS: 5.0000 mg | ORAL_TABLET | Freq: Every day | ORAL | 3 refills | Status: DC

## 2020-12-16 NOTE — Patient Instructions (Addendum)
Medication Instructions:  Your physician has recommended you make the following change in your medication:   1.  Continue your Eliquis 5 mg by mouth twice a day for TWO DAYS along with your coumadin (warfarin).  After TWO days together STOP the eliquis.  2.  START taking warfarin 5 mg- Take one tablet by mouth DAILY at bedtime.  3.  Schedule NEW START WARFARIN with coumadin clinic  Labwork: None ordered.  Testing/Procedures: None ordered.  Follow-Up: Your physician wants you to follow-up in: as needed with Dr. Quentin Ore.     Any Other Special Instructions Will Be Listed Below (If Applicable).  If you need a refill on your cardiac medications before your next appointment, please call your pharmacy.
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
## 2020-12-17 DIAGNOSIS — D689 Coagulation defect, unspecified: Secondary | ICD-10-CM | POA: Diagnosis not present

## 2020-12-17 DIAGNOSIS — D631 Anemia in chronic kidney disease: Secondary | ICD-10-CM | POA: Diagnosis not present

## 2020-12-17 DIAGNOSIS — N2581 Secondary hyperparathyroidism of renal origin: Secondary | ICD-10-CM | POA: Diagnosis not present

## 2020-12-17 DIAGNOSIS — N186 End stage renal disease: Secondary | ICD-10-CM | POA: Diagnosis not present

## 2020-12-17 DIAGNOSIS — Z992 Dependence on renal dialysis: Secondary | ICD-10-CM | POA: Diagnosis not present

## 2020-12-19 DIAGNOSIS — N2581 Secondary hyperparathyroidism of renal origin: Secondary | ICD-10-CM | POA: Diagnosis not present

## 2020-12-19 DIAGNOSIS — Z992 Dependence on renal dialysis: Secondary | ICD-10-CM | POA: Diagnosis not present

## 2020-12-19 DIAGNOSIS — N186 End stage renal disease: Secondary | ICD-10-CM | POA: Diagnosis not present

## 2020-12-19 DIAGNOSIS — D631 Anemia in chronic kidney disease: Secondary | ICD-10-CM | POA: Diagnosis not present

## 2020-12-19 DIAGNOSIS — D689 Coagulation defect, unspecified: Secondary | ICD-10-CM | POA: Diagnosis not present

## 2020-12-22 DIAGNOSIS — N186 End stage renal disease: Secondary | ICD-10-CM | POA: Diagnosis not present

## 2020-12-22 DIAGNOSIS — D689 Coagulation defect, unspecified: Secondary | ICD-10-CM | POA: Diagnosis not present

## 2020-12-22 DIAGNOSIS — D631 Anemia in chronic kidney disease: Secondary | ICD-10-CM | POA: Diagnosis not present

## 2020-12-22 DIAGNOSIS — N2581 Secondary hyperparathyroidism of renal origin: Secondary | ICD-10-CM | POA: Diagnosis not present

## 2020-12-22 DIAGNOSIS — Z992 Dependence on renal dialysis: Secondary | ICD-10-CM | POA: Diagnosis not present

## 2020-12-24 DIAGNOSIS — D689 Coagulation defect, unspecified: Secondary | ICD-10-CM | POA: Diagnosis not present

## 2020-12-24 DIAGNOSIS — N2581 Secondary hyperparathyroidism of renal origin: Secondary | ICD-10-CM | POA: Diagnosis not present

## 2020-12-24 DIAGNOSIS — D631 Anemia in chronic kidney disease: Secondary | ICD-10-CM | POA: Diagnosis not present

## 2020-12-24 DIAGNOSIS — N186 End stage renal disease: Secondary | ICD-10-CM | POA: Diagnosis not present

## 2020-12-24 DIAGNOSIS — Z992 Dependence on renal dialysis: Secondary | ICD-10-CM | POA: Diagnosis not present

## 2020-12-25 ENCOUNTER — Other Ambulatory Visit: Payer: Self-pay

## 2020-12-25 ENCOUNTER — Ambulatory Visit: Payer: Medicare Other

## 2020-12-25 DIAGNOSIS — I4819 Other persistent atrial fibrillation: Secondary | ICD-10-CM

## 2020-12-25 DIAGNOSIS — I480 Paroxysmal atrial fibrillation: Secondary | ICD-10-CM

## 2020-12-25 DIAGNOSIS — Z5181 Encounter for therapeutic drug level monitoring: Secondary | ICD-10-CM

## 2020-12-25 LAB — POCT INR: INR: 2.3 (ref 2.0–3.0)

## 2020-12-25 NOTE — Patient Instructions (Signed)
-   continue warfarin dosage of 1 tablet (5 mg) every day - recheck INR in 1 week  Please call the [facility] Clinic @ [phone] if you have any questions

## 2020-12-28 DIAGNOSIS — Z992 Dependence on renal dialysis: Secondary | ICD-10-CM | POA: Diagnosis not present

## 2020-12-28 DIAGNOSIS — I129 Hypertensive chronic kidney disease with stage 1 through stage 4 chronic kidney disease, or unspecified chronic kidney disease: Secondary | ICD-10-CM | POA: Diagnosis not present

## 2020-12-28 DIAGNOSIS — N186 End stage renal disease: Secondary | ICD-10-CM | POA: Diagnosis not present

## 2020-12-29 DIAGNOSIS — Z23 Encounter for immunization: Secondary | ICD-10-CM | POA: Diagnosis not present

## 2020-12-29 DIAGNOSIS — N186 End stage renal disease: Secondary | ICD-10-CM | POA: Diagnosis not present

## 2020-12-29 DIAGNOSIS — N2581 Secondary hyperparathyroidism of renal origin: Secondary | ICD-10-CM | POA: Diagnosis not present

## 2020-12-29 DIAGNOSIS — D631 Anemia in chronic kidney disease: Secondary | ICD-10-CM | POA: Diagnosis not present

## 2020-12-29 DIAGNOSIS — Z283 Underimmunization status: Secondary | ICD-10-CM | POA: Diagnosis not present

## 2020-12-29 DIAGNOSIS — D509 Iron deficiency anemia, unspecified: Secondary | ICD-10-CM | POA: Diagnosis not present

## 2020-12-29 DIAGNOSIS — Z992 Dependence on renal dialysis: Secondary | ICD-10-CM | POA: Diagnosis not present

## 2020-12-29 DIAGNOSIS — D689 Coagulation defect, unspecified: Secondary | ICD-10-CM | POA: Diagnosis not present

## 2020-12-31 DIAGNOSIS — Z992 Dependence on renal dialysis: Secondary | ICD-10-CM | POA: Diagnosis not present

## 2020-12-31 DIAGNOSIS — D689 Coagulation defect, unspecified: Secondary | ICD-10-CM | POA: Diagnosis not present

## 2020-12-31 DIAGNOSIS — Z283 Underimmunization status: Secondary | ICD-10-CM | POA: Diagnosis not present

## 2020-12-31 DIAGNOSIS — Z23 Encounter for immunization: Secondary | ICD-10-CM | POA: Diagnosis not present

## 2020-12-31 DIAGNOSIS — N186 End stage renal disease: Secondary | ICD-10-CM | POA: Diagnosis not present

## 2020-12-31 DIAGNOSIS — N2581 Secondary hyperparathyroidism of renal origin: Secondary | ICD-10-CM | POA: Diagnosis not present

## 2020-12-31 DIAGNOSIS — D631 Anemia in chronic kidney disease: Secondary | ICD-10-CM | POA: Diagnosis not present

## 2020-12-31 DIAGNOSIS — D509 Iron deficiency anemia, unspecified: Secondary | ICD-10-CM | POA: Diagnosis not present

## 2021-01-01 ENCOUNTER — Ambulatory Visit: Payer: Medicare Other

## 2021-01-01 ENCOUNTER — Other Ambulatory Visit: Payer: Self-pay

## 2021-01-01 DIAGNOSIS — I4819 Other persistent atrial fibrillation: Secondary | ICD-10-CM

## 2021-01-01 DIAGNOSIS — I480 Paroxysmal atrial fibrillation: Secondary | ICD-10-CM | POA: Diagnosis not present

## 2021-01-01 DIAGNOSIS — Z5181 Encounter for therapeutic drug level monitoring: Secondary | ICD-10-CM | POA: Diagnosis not present

## 2021-01-01 LAB — POCT INR: INR: 2.2 (ref 2.0–3.0)

## 2021-01-01 NOTE — Patient Instructions (Signed)
-   continue warfarin dosage of 1 tablet (5 mg) every day - recheck INR in 2 weeks  Please call the [facility]madin Clinic @ [phone] if you have any questions

## 2021-01-02 DIAGNOSIS — N186 End stage renal disease: Secondary | ICD-10-CM | POA: Diagnosis not present

## 2021-01-02 DIAGNOSIS — N2581 Secondary hyperparathyroidism of renal origin: Secondary | ICD-10-CM | POA: Diagnosis not present

## 2021-01-02 DIAGNOSIS — D689 Coagulation defect, unspecified: Secondary | ICD-10-CM | POA: Diagnosis not present

## 2021-01-02 DIAGNOSIS — D509 Iron deficiency anemia, unspecified: Secondary | ICD-10-CM | POA: Diagnosis not present

## 2021-01-02 DIAGNOSIS — Z992 Dependence on renal dialysis: Secondary | ICD-10-CM | POA: Diagnosis not present

## 2021-01-02 DIAGNOSIS — D631 Anemia in chronic kidney disease: Secondary | ICD-10-CM | POA: Diagnosis not present

## 2021-01-02 DIAGNOSIS — Z283 Underimmunization status: Secondary | ICD-10-CM | POA: Diagnosis not present

## 2021-01-02 DIAGNOSIS — Z23 Encounter for immunization: Secondary | ICD-10-CM | POA: Diagnosis not present

## 2021-01-05 DIAGNOSIS — N186 End stage renal disease: Secondary | ICD-10-CM | POA: Diagnosis not present

## 2021-01-05 DIAGNOSIS — N2581 Secondary hyperparathyroidism of renal origin: Secondary | ICD-10-CM | POA: Diagnosis not present

## 2021-01-05 DIAGNOSIS — Z283 Underimmunization status: Secondary | ICD-10-CM | POA: Diagnosis not present

## 2021-01-05 DIAGNOSIS — D631 Anemia in chronic kidney disease: Secondary | ICD-10-CM | POA: Diagnosis not present

## 2021-01-05 DIAGNOSIS — D509 Iron deficiency anemia, unspecified: Secondary | ICD-10-CM | POA: Diagnosis not present

## 2021-01-05 DIAGNOSIS — Z992 Dependence on renal dialysis: Secondary | ICD-10-CM | POA: Diagnosis not present

## 2021-01-05 DIAGNOSIS — Z23 Encounter for immunization: Secondary | ICD-10-CM | POA: Diagnosis not present

## 2021-01-05 DIAGNOSIS — D689 Coagulation defect, unspecified: Secondary | ICD-10-CM | POA: Diagnosis not present

## 2021-01-07 DIAGNOSIS — Z992 Dependence on renal dialysis: Secondary | ICD-10-CM | POA: Diagnosis not present

## 2021-01-07 DIAGNOSIS — D509 Iron deficiency anemia, unspecified: Secondary | ICD-10-CM | POA: Diagnosis not present

## 2021-01-07 DIAGNOSIS — N186 End stage renal disease: Secondary | ICD-10-CM | POA: Diagnosis not present

## 2021-01-07 DIAGNOSIS — N2581 Secondary hyperparathyroidism of renal origin: Secondary | ICD-10-CM | POA: Diagnosis not present

## 2021-01-07 DIAGNOSIS — D631 Anemia in chronic kidney disease: Secondary | ICD-10-CM | POA: Diagnosis not present

## 2021-01-07 DIAGNOSIS — Z23 Encounter for immunization: Secondary | ICD-10-CM | POA: Diagnosis not present

## 2021-01-07 DIAGNOSIS — Z283 Underimmunization status: Secondary | ICD-10-CM | POA: Diagnosis not present

## 2021-01-07 DIAGNOSIS — D689 Coagulation defect, unspecified: Secondary | ICD-10-CM | POA: Diagnosis not present

## 2021-01-09 DIAGNOSIS — Z23 Encounter for immunization: Secondary | ICD-10-CM | POA: Diagnosis not present

## 2021-01-09 DIAGNOSIS — D689 Coagulation defect, unspecified: Secondary | ICD-10-CM | POA: Diagnosis not present

## 2021-01-09 DIAGNOSIS — Z283 Underimmunization status: Secondary | ICD-10-CM | POA: Diagnosis not present

## 2021-01-09 DIAGNOSIS — N2581 Secondary hyperparathyroidism of renal origin: Secondary | ICD-10-CM | POA: Diagnosis not present

## 2021-01-09 DIAGNOSIS — N186 End stage renal disease: Secondary | ICD-10-CM | POA: Diagnosis not present

## 2021-01-09 DIAGNOSIS — D509 Iron deficiency anemia, unspecified: Secondary | ICD-10-CM | POA: Diagnosis not present

## 2021-01-09 DIAGNOSIS — Z992 Dependence on renal dialysis: Secondary | ICD-10-CM | POA: Diagnosis not present

## 2021-01-09 DIAGNOSIS — D631 Anemia in chronic kidney disease: Secondary | ICD-10-CM | POA: Diagnosis not present

## 2021-01-12 DIAGNOSIS — Z283 Underimmunization status: Secondary | ICD-10-CM | POA: Diagnosis not present

## 2021-01-12 DIAGNOSIS — N2581 Secondary hyperparathyroidism of renal origin: Secondary | ICD-10-CM | POA: Diagnosis not present

## 2021-01-12 DIAGNOSIS — D689 Coagulation defect, unspecified: Secondary | ICD-10-CM | POA: Diagnosis not present

## 2021-01-12 DIAGNOSIS — Z992 Dependence on renal dialysis: Secondary | ICD-10-CM | POA: Diagnosis not present

## 2021-01-12 DIAGNOSIS — D631 Anemia in chronic kidney disease: Secondary | ICD-10-CM | POA: Diagnosis not present

## 2021-01-12 DIAGNOSIS — Z23 Encounter for immunization: Secondary | ICD-10-CM | POA: Diagnosis not present

## 2021-01-12 DIAGNOSIS — D509 Iron deficiency anemia, unspecified: Secondary | ICD-10-CM | POA: Diagnosis not present

## 2021-01-12 DIAGNOSIS — N186 End stage renal disease: Secondary | ICD-10-CM | POA: Diagnosis not present

## 2021-01-14 DIAGNOSIS — D631 Anemia in chronic kidney disease: Secondary | ICD-10-CM | POA: Diagnosis not present

## 2021-01-14 DIAGNOSIS — N2581 Secondary hyperparathyroidism of renal origin: Secondary | ICD-10-CM | POA: Diagnosis not present

## 2021-01-14 DIAGNOSIS — Z283 Underimmunization status: Secondary | ICD-10-CM | POA: Diagnosis not present

## 2021-01-14 DIAGNOSIS — Z992 Dependence on renal dialysis: Secondary | ICD-10-CM | POA: Diagnosis not present

## 2021-01-14 DIAGNOSIS — N186 End stage renal disease: Secondary | ICD-10-CM | POA: Diagnosis not present

## 2021-01-14 DIAGNOSIS — D689 Coagulation defect, unspecified: Secondary | ICD-10-CM | POA: Diagnosis not present

## 2021-01-14 DIAGNOSIS — D509 Iron deficiency anemia, unspecified: Secondary | ICD-10-CM | POA: Diagnosis not present

## 2021-01-14 DIAGNOSIS — Z23 Encounter for immunization: Secondary | ICD-10-CM | POA: Diagnosis not present

## 2021-01-15 ENCOUNTER — Ambulatory Visit: Payer: Medicare Other | Admitting: *Deleted

## 2021-01-15 ENCOUNTER — Other Ambulatory Visit: Payer: Self-pay

## 2021-01-15 DIAGNOSIS — I480 Paroxysmal atrial fibrillation: Secondary | ICD-10-CM | POA: Diagnosis not present

## 2021-01-15 DIAGNOSIS — Z5181 Encounter for therapeutic drug level monitoring: Secondary | ICD-10-CM | POA: Diagnosis not present

## 2021-01-15 DIAGNOSIS — I4819 Other persistent atrial fibrillation: Secondary | ICD-10-CM

## 2021-01-15 LAB — POCT INR: INR: 2 (ref 2.0–3.0)

## 2021-01-15 NOTE — Patient Instructions (Signed)
Description   Continue taking Warfarin 1 tablet (5 mg) every day. Recheck INR in 2 weeks. Please call the Coumadin Clinic @ [phone] if you have any questions

## 2021-01-16 DIAGNOSIS — Z283 Underimmunization status: Secondary | ICD-10-CM | POA: Diagnosis not present

## 2021-01-16 DIAGNOSIS — D631 Anemia in chronic kidney disease: Secondary | ICD-10-CM | POA: Diagnosis not present

## 2021-01-16 DIAGNOSIS — D689 Coagulation defect, unspecified: Secondary | ICD-10-CM | POA: Diagnosis not present

## 2021-01-16 DIAGNOSIS — N186 End stage renal disease: Secondary | ICD-10-CM | POA: Diagnosis not present

## 2021-01-16 DIAGNOSIS — N2581 Secondary hyperparathyroidism of renal origin: Secondary | ICD-10-CM | POA: Diagnosis not present

## 2021-01-16 DIAGNOSIS — Z992 Dependence on renal dialysis: Secondary | ICD-10-CM | POA: Diagnosis not present

## 2021-01-16 DIAGNOSIS — Z23 Encounter for immunization: Secondary | ICD-10-CM | POA: Diagnosis not present

## 2021-01-16 DIAGNOSIS — D509 Iron deficiency anemia, unspecified: Secondary | ICD-10-CM | POA: Diagnosis not present

## 2021-01-21 DIAGNOSIS — D631 Anemia in chronic kidney disease: Secondary | ICD-10-CM | POA: Diagnosis not present

## 2021-01-21 DIAGNOSIS — N2581 Secondary hyperparathyroidism of renal origin: Secondary | ICD-10-CM | POA: Diagnosis not present

## 2021-01-21 DIAGNOSIS — D509 Iron deficiency anemia, unspecified: Secondary | ICD-10-CM | POA: Diagnosis not present

## 2021-01-21 DIAGNOSIS — Z23 Encounter for immunization: Secondary | ICD-10-CM | POA: Diagnosis not present

## 2021-01-21 DIAGNOSIS — Z283 Underimmunization status: Secondary | ICD-10-CM | POA: Diagnosis not present

## 2021-01-21 DIAGNOSIS — D689 Coagulation defect, unspecified: Secondary | ICD-10-CM | POA: Diagnosis not present

## 2021-01-21 DIAGNOSIS — Z992 Dependence on renal dialysis: Secondary | ICD-10-CM | POA: Diagnosis not present

## 2021-01-21 DIAGNOSIS — N186 End stage renal disease: Secondary | ICD-10-CM | POA: Diagnosis not present

## 2021-01-23 DIAGNOSIS — N2581 Secondary hyperparathyroidism of renal origin: Secondary | ICD-10-CM | POA: Diagnosis not present

## 2021-01-23 DIAGNOSIS — D631 Anemia in chronic kidney disease: Secondary | ICD-10-CM | POA: Diagnosis not present

## 2021-01-23 DIAGNOSIS — Z283 Underimmunization status: Secondary | ICD-10-CM | POA: Diagnosis not present

## 2021-01-23 DIAGNOSIS — N186 End stage renal disease: Secondary | ICD-10-CM | POA: Diagnosis not present

## 2021-01-23 DIAGNOSIS — Z23 Encounter for immunization: Secondary | ICD-10-CM | POA: Diagnosis not present

## 2021-01-23 DIAGNOSIS — D509 Iron deficiency anemia, unspecified: Secondary | ICD-10-CM | POA: Diagnosis not present

## 2021-01-23 DIAGNOSIS — Z992 Dependence on renal dialysis: Secondary | ICD-10-CM | POA: Diagnosis not present

## 2021-01-23 DIAGNOSIS — D689 Coagulation defect, unspecified: Secondary | ICD-10-CM | POA: Diagnosis not present

## 2021-01-25 DIAGNOSIS — Z992 Dependence on renal dialysis: Secondary | ICD-10-CM | POA: Diagnosis not present

## 2021-01-25 DIAGNOSIS — N186 End stage renal disease: Secondary | ICD-10-CM | POA: Diagnosis not present

## 2021-01-25 DIAGNOSIS — I129 Hypertensive chronic kidney disease with stage 1 through stage 4 chronic kidney disease, or unspecified chronic kidney disease: Secondary | ICD-10-CM | POA: Diagnosis not present

## 2021-01-26 DIAGNOSIS — Z283 Underimmunization status: Secondary | ICD-10-CM | POA: Diagnosis not present

## 2021-01-26 DIAGNOSIS — Z992 Dependence on renal dialysis: Secondary | ICD-10-CM | POA: Diagnosis not present

## 2021-01-26 DIAGNOSIS — N186 End stage renal disease: Secondary | ICD-10-CM | POA: Diagnosis not present

## 2021-01-26 DIAGNOSIS — D631 Anemia in chronic kidney disease: Secondary | ICD-10-CM | POA: Diagnosis not present

## 2021-01-26 DIAGNOSIS — Z23 Encounter for immunization: Secondary | ICD-10-CM | POA: Diagnosis not present

## 2021-01-26 DIAGNOSIS — N2581 Secondary hyperparathyroidism of renal origin: Secondary | ICD-10-CM | POA: Diagnosis not present

## 2021-01-26 DIAGNOSIS — D689 Coagulation defect, unspecified: Secondary | ICD-10-CM | POA: Diagnosis not present

## 2021-01-28 DIAGNOSIS — D631 Anemia in chronic kidney disease: Secondary | ICD-10-CM | POA: Diagnosis not present

## 2021-01-28 DIAGNOSIS — D689 Coagulation defect, unspecified: Secondary | ICD-10-CM | POA: Diagnosis not present

## 2021-01-28 DIAGNOSIS — Z23 Encounter for immunization: Secondary | ICD-10-CM | POA: Diagnosis not present

## 2021-01-28 DIAGNOSIS — Z283 Underimmunization status: Secondary | ICD-10-CM | POA: Diagnosis not present

## 2021-01-28 DIAGNOSIS — N2581 Secondary hyperparathyroidism of renal origin: Secondary | ICD-10-CM | POA: Diagnosis not present

## 2021-01-28 DIAGNOSIS — N186 End stage renal disease: Secondary | ICD-10-CM | POA: Diagnosis not present

## 2021-01-28 DIAGNOSIS — Z992 Dependence on renal dialysis: Secondary | ICD-10-CM | POA: Diagnosis not present

## 2021-01-29 ENCOUNTER — Ambulatory Visit (INDEPENDENT_AMBULATORY_CARE_PROVIDER_SITE_OTHER): Payer: Medicare Other | Admitting: *Deleted

## 2021-01-29 ENCOUNTER — Other Ambulatory Visit: Payer: Self-pay

## 2021-01-29 DIAGNOSIS — Z5181 Encounter for therapeutic drug level monitoring: Secondary | ICD-10-CM | POA: Diagnosis not present

## 2021-01-29 DIAGNOSIS — I480 Paroxysmal atrial fibrillation: Secondary | ICD-10-CM | POA: Diagnosis not present

## 2021-01-29 DIAGNOSIS — I4819 Other persistent atrial fibrillation: Secondary | ICD-10-CM | POA: Diagnosis not present

## 2021-01-29 LAB — POCT INR: INR: 1.6 — AB (ref 2.0–3.0)

## 2021-01-29 NOTE — Patient Instructions (Addendum)
Description   Take 1.5 tablets of warfarin today and then start taking warfarin 1 tablet daily except for 1.5 tablets on Saturdays. Recheck INR in 1 week. Please call the Coumadin Clinic @ [phone] if you have any questions

## 2021-01-30 DIAGNOSIS — Z992 Dependence on renal dialysis: Secondary | ICD-10-CM | POA: Diagnosis not present

## 2021-01-30 DIAGNOSIS — Z283 Underimmunization status: Secondary | ICD-10-CM | POA: Diagnosis not present

## 2021-01-30 DIAGNOSIS — N2581 Secondary hyperparathyroidism of renal origin: Secondary | ICD-10-CM | POA: Diagnosis not present

## 2021-01-30 DIAGNOSIS — D631 Anemia in chronic kidney disease: Secondary | ICD-10-CM | POA: Diagnosis not present

## 2021-01-30 DIAGNOSIS — Z23 Encounter for immunization: Secondary | ICD-10-CM | POA: Diagnosis not present

## 2021-01-30 DIAGNOSIS — N186 End stage renal disease: Secondary | ICD-10-CM | POA: Diagnosis not present

## 2021-01-30 DIAGNOSIS — D689 Coagulation defect, unspecified: Secondary | ICD-10-CM | POA: Diagnosis not present

## 2021-02-02 DIAGNOSIS — Z992 Dependence on renal dialysis: Secondary | ICD-10-CM | POA: Diagnosis not present

## 2021-02-02 DIAGNOSIS — N2581 Secondary hyperparathyroidism of renal origin: Secondary | ICD-10-CM | POA: Diagnosis not present

## 2021-02-02 DIAGNOSIS — N186 End stage renal disease: Secondary | ICD-10-CM | POA: Diagnosis not present

## 2021-02-02 DIAGNOSIS — D689 Coagulation defect, unspecified: Secondary | ICD-10-CM | POA: Diagnosis not present

## 2021-02-04 DIAGNOSIS — N2581 Secondary hyperparathyroidism of renal origin: Secondary | ICD-10-CM | POA: Diagnosis not present

## 2021-02-04 DIAGNOSIS — Z992 Dependence on renal dialysis: Secondary | ICD-10-CM | POA: Diagnosis not present

## 2021-02-04 DIAGNOSIS — N186 End stage renal disease: Secondary | ICD-10-CM | POA: Diagnosis not present

## 2021-02-04 DIAGNOSIS — D689 Coagulation defect, unspecified: Secondary | ICD-10-CM | POA: Diagnosis not present

## 2021-02-06 DIAGNOSIS — D689 Coagulation defect, unspecified: Secondary | ICD-10-CM | POA: Diagnosis not present

## 2021-02-06 DIAGNOSIS — N2581 Secondary hyperparathyroidism of renal origin: Secondary | ICD-10-CM | POA: Diagnosis not present

## 2021-02-06 DIAGNOSIS — Z992 Dependence on renal dialysis: Secondary | ICD-10-CM | POA: Diagnosis not present

## 2021-02-06 DIAGNOSIS — N186 End stage renal disease: Secondary | ICD-10-CM | POA: Diagnosis not present

## 2021-02-08 ENCOUNTER — Ambulatory Visit (INDEPENDENT_AMBULATORY_CARE_PROVIDER_SITE_OTHER): Payer: Medicare Other | Admitting: *Deleted

## 2021-02-08 ENCOUNTER — Other Ambulatory Visit: Payer: Self-pay

## 2021-02-08 DIAGNOSIS — I4819 Other persistent atrial fibrillation: Secondary | ICD-10-CM

## 2021-02-08 DIAGNOSIS — Z5181 Encounter for therapeutic drug level monitoring: Secondary | ICD-10-CM

## 2021-02-08 DIAGNOSIS — I480 Paroxysmal atrial fibrillation: Secondary | ICD-10-CM | POA: Diagnosis not present

## 2021-02-08 LAB — POCT INR: INR: 1.6 — AB (ref 2.0–3.0)

## 2021-02-08 NOTE — Patient Instructions (Addendum)
Description   Take 1.5 tablets today and tomorrow, then start taking 1 tablet daily except for 1.5 tablets on Sundays. Recheck INR in 10 days. Coumadin Clinic. [phone].

## 2021-02-09 ENCOUNTER — Telehealth: Payer: Self-pay | Admitting: Cardiology

## 2021-02-09 DIAGNOSIS — N2581 Secondary hyperparathyroidism of renal origin: Secondary | ICD-10-CM | POA: Diagnosis not present

## 2021-02-09 DIAGNOSIS — N186 End stage renal disease: Secondary | ICD-10-CM | POA: Diagnosis not present

## 2021-02-09 DIAGNOSIS — D689 Coagulation defect, unspecified: Secondary | ICD-10-CM | POA: Diagnosis not present

## 2021-02-09 DIAGNOSIS — Z992 Dependence on renal dialysis: Secondary | ICD-10-CM | POA: Diagnosis not present

## 2021-02-09 MED ORDER — WARFARIN SODIUM 5 MG PO TABS
5.0000 mg | ORAL_TABLET | Freq: Every day | ORAL | 2 refills | Status: DC
Start: 2021-02-09 — End: 2021-07-13

## 2021-02-09 NOTE — Telephone Encounter (Signed)
*  STAT* If patient is at the pharmacy, call can be transferred to refill team.   1. Which medications need to be refilled? (please list name of each medication and dose if known) Warfarin    2. Which pharmacy/location (including street and city if local pharmacy) is medication to be sent to? [facility] fax [phone]  3. Do they need a 30 day or 90 day supply? 30 supply

## 2021-02-11 DIAGNOSIS — D689 Coagulation defect, unspecified: Secondary | ICD-10-CM | POA: Diagnosis not present

## 2021-02-11 DIAGNOSIS — Z992 Dependence on renal dialysis: Secondary | ICD-10-CM | POA: Diagnosis not present

## 2021-02-11 DIAGNOSIS — N186 End stage renal disease: Secondary | ICD-10-CM | POA: Diagnosis not present

## 2021-02-11 DIAGNOSIS — N2581 Secondary hyperparathyroidism of renal origin: Secondary | ICD-10-CM | POA: Diagnosis not present

## 2021-02-13 DIAGNOSIS — D689 Coagulation defect, unspecified: Secondary | ICD-10-CM | POA: Diagnosis not present

## 2021-02-13 DIAGNOSIS — N186 End stage renal disease: Secondary | ICD-10-CM | POA: Diagnosis not present

## 2021-02-13 DIAGNOSIS — Z992 Dependence on renal dialysis: Secondary | ICD-10-CM | POA: Diagnosis not present

## 2021-02-13 DIAGNOSIS — N2581 Secondary hyperparathyroidism of renal origin: Secondary | ICD-10-CM | POA: Diagnosis not present

## 2021-02-16 DIAGNOSIS — N186 End stage renal disease: Secondary | ICD-10-CM | POA: Diagnosis not present

## 2021-02-16 DIAGNOSIS — N2581 Secondary hyperparathyroidism of renal origin: Secondary | ICD-10-CM | POA: Diagnosis not present

## 2021-02-16 DIAGNOSIS — D689 Coagulation defect, unspecified: Secondary | ICD-10-CM | POA: Diagnosis not present

## 2021-02-16 DIAGNOSIS — Z992 Dependence on renal dialysis: Secondary | ICD-10-CM | POA: Diagnosis not present

## 2021-02-17 ENCOUNTER — Ambulatory Visit (INDEPENDENT_AMBULATORY_CARE_PROVIDER_SITE_OTHER): Payer: Medicare Other | Admitting: *Deleted

## 2021-02-17 ENCOUNTER — Other Ambulatory Visit: Payer: Self-pay

## 2021-02-17 DIAGNOSIS — I480 Paroxysmal atrial fibrillation: Secondary | ICD-10-CM | POA: Diagnosis not present

## 2021-02-17 DIAGNOSIS — I4819 Other persistent atrial fibrillation: Secondary | ICD-10-CM

## 2021-02-17 DIAGNOSIS — Z5181 Encounter for therapeutic drug level monitoring: Secondary | ICD-10-CM | POA: Diagnosis not present

## 2021-02-17 LAB — POCT INR: INR: 1.3 — AB (ref 2.0–3.0)

## 2021-02-17 NOTE — Patient Instructions (Signed)
Description   Take 1.5 tablets today and tomorrow, then start taking 1.5 tablets daily except for 1 tablet on Mondays, Wednesdays, and Fridays. Recheck INR in 1 week. Coumadin Clinic. [phone].

## 2021-02-18 DIAGNOSIS — N2581 Secondary hyperparathyroidism of renal origin: Secondary | ICD-10-CM | POA: Diagnosis not present

## 2021-02-18 DIAGNOSIS — Z992 Dependence on renal dialysis: Secondary | ICD-10-CM | POA: Diagnosis not present

## 2021-02-18 DIAGNOSIS — D689 Coagulation defect, unspecified: Secondary | ICD-10-CM | POA: Diagnosis not present

## 2021-02-18 DIAGNOSIS — N186 End stage renal disease: Secondary | ICD-10-CM | POA: Diagnosis not present

## 2021-02-20 DIAGNOSIS — Z992 Dependence on renal dialysis: Secondary | ICD-10-CM | POA: Diagnosis not present

## 2021-02-20 DIAGNOSIS — D689 Coagulation defect, unspecified: Secondary | ICD-10-CM | POA: Diagnosis not present

## 2021-02-20 DIAGNOSIS — N2581 Secondary hyperparathyroidism of renal origin: Secondary | ICD-10-CM | POA: Diagnosis not present

## 2021-02-20 DIAGNOSIS — N186 End stage renal disease: Secondary | ICD-10-CM | POA: Diagnosis not present

## 2021-02-23 DIAGNOSIS — D689 Coagulation defect, unspecified: Secondary | ICD-10-CM | POA: Diagnosis not present

## 2021-02-23 DIAGNOSIS — N186 End stage renal disease: Secondary | ICD-10-CM | POA: Diagnosis not present

## 2021-02-23 DIAGNOSIS — Z992 Dependence on renal dialysis: Secondary | ICD-10-CM | POA: Diagnosis not present

## 2021-02-23 DIAGNOSIS — D631 Anemia in chronic kidney disease: Secondary | ICD-10-CM | POA: Diagnosis not present

## 2021-02-23 DIAGNOSIS — N2581 Secondary hyperparathyroidism of renal origin: Secondary | ICD-10-CM | POA: Diagnosis not present

## 2021-02-25 DIAGNOSIS — D689 Coagulation defect, unspecified: Secondary | ICD-10-CM | POA: Diagnosis not present

## 2021-02-25 DIAGNOSIS — D631 Anemia in chronic kidney disease: Secondary | ICD-10-CM | POA: Diagnosis not present

## 2021-02-25 DIAGNOSIS — I129 Hypertensive chronic kidney disease with stage 1 through stage 4 chronic kidney disease, or unspecified chronic kidney disease: Secondary | ICD-10-CM | POA: Diagnosis not present

## 2021-02-25 DIAGNOSIS — Z992 Dependence on renal dialysis: Secondary | ICD-10-CM | POA: Diagnosis not present

## 2021-02-25 DIAGNOSIS — N186 End stage renal disease: Secondary | ICD-10-CM | POA: Diagnosis not present

## 2021-02-25 DIAGNOSIS — N2581 Secondary hyperparathyroidism of renal origin: Secondary | ICD-10-CM | POA: Diagnosis not present

## 2021-02-27 DIAGNOSIS — N2581 Secondary hyperparathyroidism of renal origin: Secondary | ICD-10-CM | POA: Diagnosis not present

## 2021-02-27 DIAGNOSIS — N186 End stage renal disease: Secondary | ICD-10-CM | POA: Diagnosis not present

## 2021-02-27 DIAGNOSIS — Z992 Dependence on renal dialysis: Secondary | ICD-10-CM | POA: Diagnosis not present

## 2021-02-27 DIAGNOSIS — D689 Coagulation defect, unspecified: Secondary | ICD-10-CM | POA: Diagnosis not present

## 2021-03-01 ENCOUNTER — Ambulatory Visit (INDEPENDENT_AMBULATORY_CARE_PROVIDER_SITE_OTHER): Payer: Medicare Other

## 2021-03-01 ENCOUNTER — Other Ambulatory Visit: Payer: Self-pay

## 2021-03-01 DIAGNOSIS — I4819 Other persistent atrial fibrillation: Secondary | ICD-10-CM | POA: Diagnosis not present

## 2021-03-01 DIAGNOSIS — Z5181 Encounter for therapeutic drug level monitoring: Secondary | ICD-10-CM | POA: Diagnosis not present

## 2021-03-01 DIAGNOSIS — I480 Paroxysmal atrial fibrillation: Secondary | ICD-10-CM

## 2021-03-01 LAB — POCT INR: INR: 2.5 (ref 2.0–3.0)

## 2021-03-01 NOTE — Patient Instructions (Signed)
-   continue taking 1.5 tablets daily except for 1 tablet on Mondays, Wednesdays, and Fridays.  - Recheck INR in 2 weeks.  [facility]in Clinic. [phone].

## 2021-03-09 DIAGNOSIS — D631 Anemia in chronic kidney disease: Secondary | ICD-10-CM | POA: Diagnosis not present

## 2021-03-09 DIAGNOSIS — N186 End stage renal disease: Secondary | ICD-10-CM | POA: Diagnosis not present

## 2021-03-09 DIAGNOSIS — Z992 Dependence on renal dialysis: Secondary | ICD-10-CM | POA: Diagnosis not present

## 2021-03-09 DIAGNOSIS — N2581 Secondary hyperparathyroidism of renal origin: Secondary | ICD-10-CM | POA: Diagnosis not present

## 2021-03-09 DIAGNOSIS — D689 Coagulation defect, unspecified: Secondary | ICD-10-CM | POA: Diagnosis not present

## 2021-03-11 DIAGNOSIS — Z992 Dependence on renal dialysis: Secondary | ICD-10-CM | POA: Diagnosis not present

## 2021-03-11 DIAGNOSIS — N186 End stage renal disease: Secondary | ICD-10-CM | POA: Diagnosis not present

## 2021-03-11 DIAGNOSIS — D631 Anemia in chronic kidney disease: Secondary | ICD-10-CM | POA: Diagnosis not present

## 2021-03-11 DIAGNOSIS — D689 Coagulation defect, unspecified: Secondary | ICD-10-CM | POA: Diagnosis not present

## 2021-03-11 DIAGNOSIS — N2581 Secondary hyperparathyroidism of renal origin: Secondary | ICD-10-CM | POA: Diagnosis not present

## 2021-03-13 DIAGNOSIS — N2581 Secondary hyperparathyroidism of renal origin: Secondary | ICD-10-CM | POA: Diagnosis not present

## 2021-03-13 DIAGNOSIS — D631 Anemia in chronic kidney disease: Secondary | ICD-10-CM | POA: Diagnosis not present

## 2021-03-13 DIAGNOSIS — Z992 Dependence on renal dialysis: Secondary | ICD-10-CM | POA: Diagnosis not present

## 2021-03-13 DIAGNOSIS — D689 Coagulation defect, unspecified: Secondary | ICD-10-CM | POA: Diagnosis not present

## 2021-03-13 DIAGNOSIS — N186 End stage renal disease: Secondary | ICD-10-CM | POA: Diagnosis not present

## 2021-03-17 ENCOUNTER — Other Ambulatory Visit: Payer: Self-pay

## 2021-03-17 ENCOUNTER — Ambulatory Visit (INDEPENDENT_AMBULATORY_CARE_PROVIDER_SITE_OTHER): Payer: Medicare Other | Admitting: *Deleted

## 2021-03-17 DIAGNOSIS — I480 Paroxysmal atrial fibrillation: Secondary | ICD-10-CM

## 2021-03-17 DIAGNOSIS — Z5181 Encounter for therapeutic drug level monitoring: Secondary | ICD-10-CM | POA: Diagnosis not present

## 2021-03-17 DIAGNOSIS — I4819 Other persistent atrial fibrillation: Secondary | ICD-10-CM

## 2021-03-17 LAB — POCT INR: INR: 2.8 (ref 2.0–3.0)

## 2021-03-17 NOTE — Patient Instructions (Signed)
Description   Continue taking 1.5 tablets daily except for 1 tablet on Mondays, Wednesdays, and Fridays. Recheck INR in 3 weeks. [facility]in Clinic. [phone].

## 2021-03-19 DIAGNOSIS — D689 Coagulation defect, unspecified: Secondary | ICD-10-CM | POA: Diagnosis not present

## 2021-03-19 DIAGNOSIS — Z992 Dependence on renal dialysis: Secondary | ICD-10-CM | POA: Diagnosis not present

## 2021-03-19 DIAGNOSIS — N186 End stage renal disease: Secondary | ICD-10-CM | POA: Diagnosis not present

## 2021-03-19 DIAGNOSIS — N2581 Secondary hyperparathyroidism of renal origin: Secondary | ICD-10-CM | POA: Diagnosis not present

## 2021-03-20 DIAGNOSIS — N186 End stage renal disease: Secondary | ICD-10-CM | POA: Diagnosis not present

## 2021-03-20 DIAGNOSIS — N2581 Secondary hyperparathyroidism of renal origin: Secondary | ICD-10-CM | POA: Diagnosis not present

## 2021-03-20 DIAGNOSIS — D689 Coagulation defect, unspecified: Secondary | ICD-10-CM | POA: Diagnosis not present

## 2021-03-20 DIAGNOSIS — Z992 Dependence on renal dialysis: Secondary | ICD-10-CM | POA: Diagnosis not present

## 2021-03-23 DIAGNOSIS — N2581 Secondary hyperparathyroidism of renal origin: Secondary | ICD-10-CM | POA: Diagnosis not present

## 2021-03-23 DIAGNOSIS — D689 Coagulation defect, unspecified: Secondary | ICD-10-CM | POA: Diagnosis not present

## 2021-03-23 DIAGNOSIS — Z992 Dependence on renal dialysis: Secondary | ICD-10-CM | POA: Diagnosis not present

## 2021-03-23 DIAGNOSIS — N186 End stage renal disease: Secondary | ICD-10-CM | POA: Diagnosis not present

## 2021-03-25 DIAGNOSIS — N2581 Secondary hyperparathyroidism of renal origin: Secondary | ICD-10-CM | POA: Diagnosis not present

## 2021-03-25 DIAGNOSIS — D689 Coagulation defect, unspecified: Secondary | ICD-10-CM | POA: Diagnosis not present

## 2021-03-25 DIAGNOSIS — N186 End stage renal disease: Secondary | ICD-10-CM | POA: Diagnosis not present

## 2021-03-25 DIAGNOSIS — Z992 Dependence on renal dialysis: Secondary | ICD-10-CM | POA: Diagnosis not present

## 2021-03-27 DIAGNOSIS — Z992 Dependence on renal dialysis: Secondary | ICD-10-CM | POA: Diagnosis not present

## 2021-03-27 DIAGNOSIS — N186 End stage renal disease: Secondary | ICD-10-CM | POA: Diagnosis not present

## 2021-03-27 DIAGNOSIS — D689 Coagulation defect, unspecified: Secondary | ICD-10-CM | POA: Diagnosis not present

## 2021-03-27 DIAGNOSIS — N2581 Secondary hyperparathyroidism of renal origin: Secondary | ICD-10-CM | POA: Diagnosis not present

## 2021-03-27 DIAGNOSIS — I129 Hypertensive chronic kidney disease with stage 1 through stage 4 chronic kidney disease, or unspecified chronic kidney disease: Secondary | ICD-10-CM | POA: Diagnosis not present

## 2021-03-30 DIAGNOSIS — N186 End stage renal disease: Secondary | ICD-10-CM | POA: Diagnosis not present

## 2021-03-30 DIAGNOSIS — D631 Anemia in chronic kidney disease: Secondary | ICD-10-CM | POA: Diagnosis not present

## 2021-03-30 DIAGNOSIS — N2581 Secondary hyperparathyroidism of renal origin: Secondary | ICD-10-CM | POA: Diagnosis not present

## 2021-03-30 DIAGNOSIS — D689 Coagulation defect, unspecified: Secondary | ICD-10-CM | POA: Diagnosis not present

## 2021-03-30 DIAGNOSIS — Z23 Encounter for immunization: Secondary | ICD-10-CM | POA: Diagnosis not present

## 2021-03-30 DIAGNOSIS — Z992 Dependence on renal dialysis: Secondary | ICD-10-CM | POA: Diagnosis not present

## 2021-04-01 DIAGNOSIS — N186 End stage renal disease: Secondary | ICD-10-CM | POA: Diagnosis not present

## 2021-04-01 DIAGNOSIS — D631 Anemia in chronic kidney disease: Secondary | ICD-10-CM | POA: Diagnosis not present

## 2021-04-01 DIAGNOSIS — N2581 Secondary hyperparathyroidism of renal origin: Secondary | ICD-10-CM | POA: Diagnosis not present

## 2021-04-01 DIAGNOSIS — Z992 Dependence on renal dialysis: Secondary | ICD-10-CM | POA: Diagnosis not present

## 2021-04-01 DIAGNOSIS — D689 Coagulation defect, unspecified: Secondary | ICD-10-CM | POA: Diagnosis not present

## 2021-04-01 DIAGNOSIS — Z23 Encounter for immunization: Secondary | ICD-10-CM | POA: Diagnosis not present

## 2021-04-03 DIAGNOSIS — D689 Coagulation defect, unspecified: Secondary | ICD-10-CM | POA: Diagnosis not present

## 2021-04-03 DIAGNOSIS — Z23 Encounter for immunization: Secondary | ICD-10-CM | POA: Diagnosis not present

## 2021-04-03 DIAGNOSIS — Z992 Dependence on renal dialysis: Secondary | ICD-10-CM | POA: Diagnosis not present

## 2021-04-03 DIAGNOSIS — N186 End stage renal disease: Secondary | ICD-10-CM | POA: Diagnosis not present

## 2021-04-03 DIAGNOSIS — N2581 Secondary hyperparathyroidism of renal origin: Secondary | ICD-10-CM | POA: Diagnosis not present

## 2021-04-03 DIAGNOSIS — D631 Anemia in chronic kidney disease: Secondary | ICD-10-CM | POA: Diagnosis not present

## 2021-04-06 DIAGNOSIS — Z992 Dependence on renal dialysis: Secondary | ICD-10-CM | POA: Diagnosis not present

## 2021-04-06 DIAGNOSIS — D689 Coagulation defect, unspecified: Secondary | ICD-10-CM | POA: Diagnosis not present

## 2021-04-06 DIAGNOSIS — N2581 Secondary hyperparathyroidism of renal origin: Secondary | ICD-10-CM | POA: Diagnosis not present

## 2021-04-06 DIAGNOSIS — N186 End stage renal disease: Secondary | ICD-10-CM | POA: Diagnosis not present

## 2021-04-07 ENCOUNTER — Ambulatory Visit (INDEPENDENT_AMBULATORY_CARE_PROVIDER_SITE_OTHER): Payer: Medicare Other

## 2021-04-07 ENCOUNTER — Other Ambulatory Visit: Payer: Self-pay

## 2021-04-07 DIAGNOSIS — I4819 Other persistent atrial fibrillation: Secondary | ICD-10-CM | POA: Diagnosis not present

## 2021-04-07 DIAGNOSIS — Z5181 Encounter for therapeutic drug level monitoring: Secondary | ICD-10-CM

## 2021-04-07 DIAGNOSIS — I480 Paroxysmal atrial fibrillation: Secondary | ICD-10-CM

## 2021-04-07 LAB — POCT INR: INR: 7.5 — AB (ref 2.0–3.0)

## 2021-04-07 LAB — PROTIME-INR
INR: 7.2 (ref 0.9–1.2)
Prothrombin Time: 66.9 s — ABNORMAL HIGH (ref 9.1–12.0)

## 2021-04-07 NOTE — Patient Instructions (Signed)
Description   INR 7.5, sent for STAT lab, pt has not taken Warfarin today.  Instructed pt to hold Warfarin until further instructions given after labwork results received this afternoon.  Go to the ED with bleeding. INR 7.2 at lab, called spoke with pt advised to hold Warfarin x 3 dosages, then take 1 tablet on Saturday and Sunday then recheck on Monday.  Suspect will need to reduce dosage to 1 tablet daily except 1.5 tablets on Thursdays. Coumadin Clinic. [phone].

## 2021-04-08 DIAGNOSIS — D689 Coagulation defect, unspecified: Secondary | ICD-10-CM | POA: Diagnosis not present

## 2021-04-08 DIAGNOSIS — N186 End stage renal disease: Secondary | ICD-10-CM | POA: Diagnosis not present

## 2021-04-08 DIAGNOSIS — Z992 Dependence on renal dialysis: Secondary | ICD-10-CM | POA: Diagnosis not present

## 2021-04-08 DIAGNOSIS — N2581 Secondary hyperparathyroidism of renal origin: Secondary | ICD-10-CM | POA: Diagnosis not present

## 2021-04-10 DIAGNOSIS — Z992 Dependence on renal dialysis: Secondary | ICD-10-CM | POA: Diagnosis not present

## 2021-04-10 DIAGNOSIS — D689 Coagulation defect, unspecified: Secondary | ICD-10-CM | POA: Diagnosis not present

## 2021-04-10 DIAGNOSIS — N2581 Secondary hyperparathyroidism of renal origin: Secondary | ICD-10-CM | POA: Diagnosis not present

## 2021-04-10 DIAGNOSIS — N186 End stage renal disease: Secondary | ICD-10-CM | POA: Diagnosis not present

## 2021-04-12 ENCOUNTER — Other Ambulatory Visit: Payer: Self-pay

## 2021-04-12 ENCOUNTER — Ambulatory Visit (INDEPENDENT_AMBULATORY_CARE_PROVIDER_SITE_OTHER): Payer: Medicare Other | Admitting: Pharmacist

## 2021-04-12 DIAGNOSIS — I4819 Other persistent atrial fibrillation: Secondary | ICD-10-CM

## 2021-04-12 DIAGNOSIS — I480 Paroxysmal atrial fibrillation: Secondary | ICD-10-CM | POA: Diagnosis not present

## 2021-04-12 DIAGNOSIS — Z5181 Encounter for therapeutic drug level monitoring: Secondary | ICD-10-CM | POA: Diagnosis not present

## 2021-04-12 LAB — POCT INR: INR: 2.2 (ref 2.0–3.0)

## 2021-04-12 NOTE — Patient Instructions (Signed)
Description   Take 1 tablet by mouth (5mg ) every day exept Thursday take 1 and 1/2 tablets (7.5mg ).  Keep your green leafy vegetable intake consistent. Coumadin Clinic. [phone].

## 2021-04-13 DIAGNOSIS — Z992 Dependence on renal dialysis: Secondary | ICD-10-CM | POA: Diagnosis not present

## 2021-04-13 DIAGNOSIS — N186 End stage renal disease: Secondary | ICD-10-CM | POA: Diagnosis not present

## 2021-04-13 DIAGNOSIS — N2581 Secondary hyperparathyroidism of renal origin: Secondary | ICD-10-CM | POA: Diagnosis not present

## 2021-04-17 DIAGNOSIS — N2581 Secondary hyperparathyroidism of renal origin: Secondary | ICD-10-CM | POA: Diagnosis not present

## 2021-04-17 DIAGNOSIS — N186 End stage renal disease: Secondary | ICD-10-CM | POA: Diagnosis not present

## 2021-04-17 DIAGNOSIS — Z992 Dependence on renal dialysis: Secondary | ICD-10-CM | POA: Diagnosis not present

## 2021-04-24 DIAGNOSIS — N186 End stage renal disease: Secondary | ICD-10-CM | POA: Diagnosis not present

## 2021-04-24 DIAGNOSIS — N2581 Secondary hyperparathyroidism of renal origin: Secondary | ICD-10-CM | POA: Diagnosis not present

## 2021-04-24 DIAGNOSIS — Z992 Dependence on renal dialysis: Secondary | ICD-10-CM | POA: Diagnosis not present

## 2021-04-26 DIAGNOSIS — Z7901 Long term (current) use of anticoagulants: Secondary | ICD-10-CM | POA: Diagnosis not present

## 2021-04-26 DIAGNOSIS — N2581 Secondary hyperparathyroidism of renal origin: Secondary | ICD-10-CM | POA: Diagnosis not present

## 2021-04-26 DIAGNOSIS — N186 End stage renal disease: Secondary | ICD-10-CM | POA: Diagnosis not present

## 2021-04-26 DIAGNOSIS — Z992 Dependence on renal dialysis: Secondary | ICD-10-CM | POA: Diagnosis not present

## 2021-04-27 ENCOUNTER — Ambulatory Visit (INDEPENDENT_AMBULATORY_CARE_PROVIDER_SITE_OTHER): Payer: Medicare Other

## 2021-04-27 ENCOUNTER — Other Ambulatory Visit: Payer: Self-pay

## 2021-04-27 DIAGNOSIS — I129 Hypertensive chronic kidney disease with stage 1 through stage 4 chronic kidney disease, or unspecified chronic kidney disease: Secondary | ICD-10-CM | POA: Diagnosis not present

## 2021-04-27 DIAGNOSIS — Z5181 Encounter for therapeutic drug level monitoring: Secondary | ICD-10-CM | POA: Diagnosis not present

## 2021-04-27 DIAGNOSIS — Z992 Dependence on renal dialysis: Secondary | ICD-10-CM | POA: Diagnosis not present

## 2021-04-27 DIAGNOSIS — I4819 Other persistent atrial fibrillation: Secondary | ICD-10-CM

## 2021-04-27 DIAGNOSIS — N2581 Secondary hyperparathyroidism of renal origin: Secondary | ICD-10-CM | POA: Diagnosis not present

## 2021-04-27 DIAGNOSIS — Z7901 Long term (current) use of anticoagulants: Secondary | ICD-10-CM | POA: Diagnosis not present

## 2021-04-27 DIAGNOSIS — I480 Paroxysmal atrial fibrillation: Secondary | ICD-10-CM

## 2021-04-27 DIAGNOSIS — N186 End stage renal disease: Secondary | ICD-10-CM | POA: Diagnosis not present

## 2021-04-27 LAB — POCT INR: INR: 2.9 (ref 2.0–3.0)

## 2021-04-27 NOTE — Patient Instructions (Signed)
-   Continue warfarin dosage of 1 tablet by mouth (5mg ) every day exept Thursday take 1 and 1/2 tablets (7.5mg ).   - Keep your green leafy vegetable intake consistent.  - Recheck INR in 3 weeks. Coumadin Clinic. [phone].

## 2021-04-28 DIAGNOSIS — R7303 Prediabetes: Secondary | ICD-10-CM | POA: Diagnosis not present

## 2021-04-28 DIAGNOSIS — E782 Mixed hyperlipidemia: Secondary | ICD-10-CM | POA: Diagnosis not present

## 2021-04-28 DIAGNOSIS — G894 Chronic pain syndrome: Secondary | ICD-10-CM | POA: Diagnosis not present

## 2021-04-28 DIAGNOSIS — I10 Essential (primary) hypertension: Secondary | ICD-10-CM | POA: Diagnosis not present

## 2021-04-28 DIAGNOSIS — I4891 Unspecified atrial fibrillation: Secondary | ICD-10-CM | POA: Diagnosis not present

## 2021-04-28 DIAGNOSIS — Z992 Dependence on renal dialysis: Secondary | ICD-10-CM | POA: Diagnosis not present

## 2021-04-29 DIAGNOSIS — N186 End stage renal disease: Secondary | ICD-10-CM | POA: Diagnosis not present

## 2021-04-29 DIAGNOSIS — N2581 Secondary hyperparathyroidism of renal origin: Secondary | ICD-10-CM | POA: Diagnosis not present

## 2021-04-29 DIAGNOSIS — D631 Anemia in chronic kidney disease: Secondary | ICD-10-CM | POA: Diagnosis not present

## 2021-04-29 DIAGNOSIS — Z992 Dependence on renal dialysis: Secondary | ICD-10-CM | POA: Diagnosis not present

## 2021-05-01 DIAGNOSIS — N2581 Secondary hyperparathyroidism of renal origin: Secondary | ICD-10-CM | POA: Diagnosis not present

## 2021-05-01 DIAGNOSIS — D631 Anemia in chronic kidney disease: Secondary | ICD-10-CM | POA: Diagnosis not present

## 2021-05-01 DIAGNOSIS — N186 End stage renal disease: Secondary | ICD-10-CM | POA: Diagnosis not present

## 2021-05-01 DIAGNOSIS — Z992 Dependence on renal dialysis: Secondary | ICD-10-CM | POA: Diagnosis not present

## 2021-05-05 DIAGNOSIS — Z7901 Long term (current) use of anticoagulants: Secondary | ICD-10-CM | POA: Diagnosis not present

## 2021-05-05 DIAGNOSIS — Z992 Dependence on renal dialysis: Secondary | ICD-10-CM | POA: Diagnosis not present

## 2021-05-05 DIAGNOSIS — N186 End stage renal disease: Secondary | ICD-10-CM | POA: Diagnosis not present

## 2021-05-05 DIAGNOSIS — N2581 Secondary hyperparathyroidism of renal origin: Secondary | ICD-10-CM | POA: Diagnosis not present

## 2021-05-08 DIAGNOSIS — N186 End stage renal disease: Secondary | ICD-10-CM | POA: Diagnosis not present

## 2021-05-08 DIAGNOSIS — N2581 Secondary hyperparathyroidism of renal origin: Secondary | ICD-10-CM | POA: Diagnosis not present

## 2021-05-08 DIAGNOSIS — Z992 Dependence on renal dialysis: Secondary | ICD-10-CM | POA: Diagnosis not present

## 2021-05-08 DIAGNOSIS — Z7901 Long term (current) use of anticoagulants: Secondary | ICD-10-CM | POA: Diagnosis not present

## 2021-05-10 ENCOUNTER — Emergency Department (HOSPITAL_COMMUNITY): Payer: Medicare Other

## 2021-05-10 ENCOUNTER — Encounter (HOSPITAL_COMMUNITY): Payer: Self-pay

## 2021-05-10 ENCOUNTER — Observation Stay (HOSPITAL_COMMUNITY)
Admission: EM | Admit: 2021-05-10 | Discharge: 2021-05-12 | Disposition: A | Discharge status: Home / Self Care | Payer: Medicare Other | Attending: Internal Medicine | Admitting: Internal Medicine

## 2021-05-10 DIAGNOSIS — I120 Hypertensive chronic kidney disease with stage 5 chronic kidney disease or end stage renal disease: Secondary | ICD-10-CM | POA: Insufficient documentation

## 2021-05-10 DIAGNOSIS — G319 Degenerative disease of nervous system, unspecified: Secondary | ICD-10-CM | POA: Diagnosis not present

## 2021-05-10 DIAGNOSIS — I480 Paroxysmal atrial fibrillation: Secondary | ICD-10-CM | POA: Diagnosis present

## 2021-05-10 DIAGNOSIS — Z992 Dependence on renal dialysis: Secondary | ICD-10-CM | POA: Diagnosis not present

## 2021-05-10 DIAGNOSIS — Y900 Blood alcohol level of less than 20 mg/100 ml: Secondary | ICD-10-CM | POA: Insufficient documentation

## 2021-05-10 DIAGNOSIS — R4182 Altered mental status, unspecified: Secondary | ICD-10-CM

## 2021-05-10 DIAGNOSIS — S0990XA Unspecified injury of head, initial encounter: Secondary | ICD-10-CM | POA: Diagnosis not present

## 2021-05-10 DIAGNOSIS — Z96653 Presence of artificial knee joint, bilateral: Secondary | ICD-10-CM | POA: Insufficient documentation

## 2021-05-10 DIAGNOSIS — R6889 Other general symptoms and signs: Secondary | ICD-10-CM | POA: Diagnosis not present

## 2021-05-10 DIAGNOSIS — I499 Cardiac arrhythmia, unspecified: Secondary | ICD-10-CM | POA: Diagnosis not present

## 2021-05-10 DIAGNOSIS — Z7982 Long term (current) use of aspirin: Secondary | ICD-10-CM | POA: Insufficient documentation

## 2021-05-10 DIAGNOSIS — R531 Weakness: Secondary | ICD-10-CM

## 2021-05-10 DIAGNOSIS — S199XXA Unspecified injury of neck, initial encounter: Secondary | ICD-10-CM | POA: Diagnosis not present

## 2021-05-10 DIAGNOSIS — Z79899 Other long term (current) drug therapy: Secondary | ICD-10-CM | POA: Diagnosis not present

## 2021-05-10 DIAGNOSIS — N186 End stage renal disease: Secondary | ICD-10-CM | POA: Diagnosis not present

## 2021-05-10 DIAGNOSIS — Z87891 Personal history of nicotine dependence: Secondary | ICD-10-CM | POA: Insufficient documentation

## 2021-05-10 DIAGNOSIS — Z7901 Long term (current) use of anticoagulants: Secondary | ICD-10-CM | POA: Insufficient documentation

## 2021-05-10 DIAGNOSIS — R188 Other ascites: Secondary | ICD-10-CM | POA: Diagnosis not present

## 2021-05-10 DIAGNOSIS — Z043 Encounter for examination and observation following other accident: Secondary | ICD-10-CM | POA: Diagnosis not present

## 2021-05-10 DIAGNOSIS — I517 Cardiomegaly: Secondary | ICD-10-CM | POA: Diagnosis not present

## 2021-05-10 DIAGNOSIS — G9340 Encephalopathy, unspecified: Secondary | ICD-10-CM | POA: Diagnosis not present

## 2021-05-10 DIAGNOSIS — M47812 Spondylosis without myelopathy or radiculopathy, cervical region: Secondary | ICD-10-CM | POA: Diagnosis not present

## 2021-05-10 DIAGNOSIS — Z20822 Contact with and (suspected) exposure to covid-19: Secondary | ICD-10-CM | POA: Insufficient documentation

## 2021-05-10 DIAGNOSIS — E279 Disorder of adrenal gland, unspecified: Secondary | ICD-10-CM | POA: Diagnosis not present

## 2021-05-10 DIAGNOSIS — R079 Chest pain, unspecified: Secondary | ICD-10-CM | POA: Diagnosis not present

## 2021-05-10 DIAGNOSIS — Z743 Need for continuous supervision: Secondary | ICD-10-CM | POA: Diagnosis not present

## 2021-05-10 DIAGNOSIS — R0789 Other chest pain: Secondary | ICD-10-CM | POA: Diagnosis not present

## 2021-05-10 DIAGNOSIS — T1490XA Injury, unspecified, initial encounter: Secondary | ICD-10-CM

## 2021-05-10 LAB — COMPREHENSIVE METABOLIC PANEL
ALT: 25 U/L (ref 0–44)
AST: 47 U/L — ABNORMAL HIGH (ref 15–41)
Albumin: 3.7 g/dL (ref 3.5–5.0)
Alkaline Phosphatase: 55 U/L (ref 38–126)
Anion gap: 17 — ABNORMAL HIGH (ref 5–15)
BUN: 51 mg/dL — ABNORMAL HIGH (ref 6–20)
CO2: 24 mmol/L (ref 22–32)
Calcium: 6.5 mg/dL — ABNORMAL LOW (ref 8.9–10.3)
Chloride: 90 mmol/L — ABNORMAL LOW (ref 98–111)
Creatinine, Ser: 8.88 mg/dL — ABNORMAL HIGH (ref 0.61–1.24)
GFR, Estimated: 6 mL/min — ABNORMAL LOW (ref >60)
Glucose, Bld: 92 mg/dL (ref 70–99)
Potassium: 4.9 mmol/L (ref 3.5–5.1)
Sodium: 131 mmol/L — ABNORMAL LOW (ref 135–145)
Total Bilirubin: 0.9 mg/dL (ref 0.3–1.2)
Total Protein: 7.3 g/dL (ref 6.5–8.1)

## 2021-05-10 LAB — CBC WITH DIFFERENTIAL/PLATELET
Abs Immature Granulocytes: 0.06 10*3/uL (ref 0.00–0.07)
Basophils Absolute: 0.1 10*3/uL (ref 0.0–0.1)
Basophils Relative: 1 %
Eosinophils Absolute: 0.2 10*3/uL (ref 0.0–0.5)
Eosinophils Relative: 2 %
HCT: 26.5 % — ABNORMAL LOW (ref 39.0–52.0)
Hemoglobin: 8.1 g/dL — ABNORMAL LOW (ref 13.0–17.0)
Immature Granulocytes: 1 %
Lymphocytes Relative: 9 %
Lymphs Abs: 0.7 10*3/uL (ref 0.7–4.0)
MCH: 31.8 pg (ref 26.0–34.0)
MCHC: 30.6 g/dL (ref 30.0–36.0)
MCV: 103.9 fL — ABNORMAL HIGH (ref 80.0–100.0)
Monocytes Absolute: 0.7 10*3/uL (ref 0.1–1.0)
Monocytes Relative: 10 %
Neutro Abs: 5.6 10*3/uL (ref 1.7–7.7)
Neutrophils Relative %: 77 %
Platelets: 162 10*3/uL (ref 150–400)
RBC: 2.55 MIL/uL — ABNORMAL LOW (ref 4.22–5.81)
RDW: 16.5 % — ABNORMAL HIGH (ref 11.5–15.5)
WBC: 7.3 10*3/uL (ref 4.0–10.5)
nRBC: 0 % (ref 0.0–0.2)

## 2021-05-10 LAB — PROTIME-INR
INR: 1.7 — ABNORMAL HIGH (ref 0.8–1.2)
Prothrombin Time: 19.8 seconds — ABNORMAL HIGH (ref 11.4–15.2)

## 2021-05-10 LAB — ACETAMINOPHEN LEVEL: Acetaminophen (Tylenol), Serum: <10 ug/mL — ABNORMAL LOW (ref 10–30)

## 2021-05-10 LAB — AMMONIA: Ammonia: 33 umol/L (ref 9–35)

## 2021-05-10 LAB — ETHANOL: Alcohol, Ethyl (B): <10 mg/dL (ref <10)

## 2021-05-10 LAB — BRAIN NATRIURETIC PEPTIDE: B Natriuretic Peptide: 453 pg/mL — ABNORMAL HIGH (ref 0.0–100.0)

## 2021-05-10 LAB — MAGNESIUM: Magnesium: 2.3 mg/dL (ref 1.7–2.4)

## 2021-05-10 LAB — HEMOGLOBIN AND HEMATOCRIT, BLOOD
HCT: 26.4 % — ABNORMAL LOW (ref 39.0–52.0)
Hemoglobin: 7.8 g/dL — ABNORMAL LOW (ref 13.0–17.0)

## 2021-05-10 LAB — TROPONIN I (HIGH SENSITIVITY): Troponin I (High Sensitivity): 50 ng/L — ABNORMAL HIGH (ref <18)

## 2021-05-10 LAB — LACTIC ACID, PLASMA: Lactic Acid, Venous: 1.6 mmol/L (ref 0.5–1.9)

## 2021-05-10 LAB — SALICYLATE LEVEL: Salicylate Lvl: <7 mg/dL — ABNORMAL LOW (ref 7.0–30.0)

## 2021-05-10 MED ORDER — IOHEXOL 300 MG/ML  SOLN
100.0000 mL | Freq: Once | INTRAMUSCULAR | Status: AC | PRN
  Administered 2021-05-10: 100 mL via INTRAVENOUS

## 2021-05-10 NOTE — ED Notes (Signed)
Patient remains drowsy, did sit up on side of bed x 2 with spouse at bedside and attempted to urinate for sample. Ox4.

## 2021-05-10 NOTE — ED Notes (Signed)
ED Provider at bedside. 

## 2021-05-10 NOTE — ED Notes (Signed)
patient unable to urinate for sample.
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
## 2021-05-10 NOTE — ED Triage Notes (Signed)
Left chest pain, sharp/stabbing, x 2-3 days, no radiation. Also fell last night 2300 and hit head, is on coumadin, with multiple falls, wife reports AMS x 2-3 days with slurred speech. Reports increased adema lower extremities. Dialysis Tu/Thurs/Sat.

## 2021-05-10 NOTE — ED Notes (Signed)
Pt's wife left contact information [phone]

## 2021-05-11 ENCOUNTER — Encounter (HOSPITAL_COMMUNITY): Payer: Self-pay | Admitting: Internal Medicine

## 2021-05-11 ENCOUNTER — Observation Stay (HOSPITAL_BASED_OUTPATIENT_CLINIC_OR_DEPARTMENT_OTHER): Payer: Medicare Other

## 2021-05-11 ENCOUNTER — Other Ambulatory Visit: Payer: Self-pay

## 2021-05-11 DIAGNOSIS — I361 Nonrheumatic tricuspid (valve) insufficiency: Secondary | ICD-10-CM | POA: Diagnosis not present

## 2021-05-11 DIAGNOSIS — G9340 Encephalopathy, unspecified: Secondary | ICD-10-CM | POA: Diagnosis present

## 2021-05-11 DIAGNOSIS — I350 Nonrheumatic aortic (valve) stenosis: Secondary | ICD-10-CM | POA: Diagnosis not present

## 2021-05-11 DIAGNOSIS — R079 Chest pain, unspecified: Secondary | ICD-10-CM

## 2021-05-11 DIAGNOSIS — R0789 Other chest pain: Secondary | ICD-10-CM | POA: Diagnosis not present

## 2021-05-11 LAB — RESP PANEL BY RT-PCR (FLU A&B, COVID) ARPGX2
Influenza A by PCR: NEGATIVE
Influenza B by PCR: NEGATIVE
SARS Coronavirus 2 by RT PCR: NEGATIVE

## 2021-05-11 LAB — ECHOCARDIOGRAM COMPLETE
AR max vel: 1.59 cm2
AV Area VTI: 1.5 cm2
AV Area mean vel: 1.77 cm2
AV Mean grad: 19 mmHg
AV Peak grad: 36.7 mmHg
Ao pk vel: 3.03 m/s
Area-P 1/2: 2.95 cm2
Height: 74 in
MV M vel: 4.62 m/s
MV Peak grad: 85.4 mmHg
MV VTI: 2.19 cm2
Radius: 0.5 cm
S' Lateral: 5.5 cm
Weight: 4928 oz

## 2021-05-11 LAB — TROPONIN I (HIGH SENSITIVITY)
Troponin I (High Sensitivity): 51 ng/L — ABNORMAL HIGH (ref <18)
Troponin I (High Sensitivity): 48 ng/L — ABNORMAL HIGH (ref <18)

## 2021-05-11 MED ORDER — FERRIC CITRATE 1 GM 210 MG(FE) PO TABS
840.0000 mg | ORAL_TABLET | Freq: Three times a day (TID) | ORAL | Status: DC
  Administered 2021-05-11 – 2021-05-12 (×5): 840 mg via ORAL
  Filled 2021-05-11 (×6): qty 4

## 2021-05-11 MED ORDER — CHLORHEXIDINE GLUCONATE CLOTH 2 % EX PADS: 6.0000 | MEDICATED_PAD | Freq: Every day | CUTANEOUS | Status: DC

## 2021-05-11 MED ORDER — HEPARIN SODIUM (PORCINE) 1000 UNIT/ML IJ SOLN
INTRAMUSCULAR | Status: AC
  Filled 2021-05-11: qty 3

## 2021-05-11 MED ORDER — WARFARIN SODIUM 7.5 MG PO TABS
7.5000 mg | ORAL_TABLET | Freq: Once | ORAL | Status: AC
  Administered 2021-05-11: 7.5 mg via ORAL
  Filled 2021-05-11: qty 1

## 2021-05-11 MED ORDER — WARFARIN - PHARMACIST DOSING INPATIENT: Freq: Every day | Status: DC

## 2021-05-11 MED ORDER — AMIODARONE HCL 200 MG PO TABS
200.0000 mg | ORAL_TABLET | Freq: Every day | ORAL | Status: DC
  Administered 2021-05-11 – 2021-05-12 (×2): 200 mg via ORAL
  Filled 2021-05-11 (×2): qty 1

## 2021-05-11 MED ORDER — ESCITALOPRAM OXALATE 10 MG PO TABS
5.0000 mg | ORAL_TABLET | Freq: Every day | ORAL | Status: DC
  Administered 2021-05-11 – 2021-05-12 (×2): 5 mg via ORAL
  Filled 2021-05-11 (×2): qty 1

## 2021-05-11 MED ORDER — VITAMIN B-12 100 MCG PO TABS
100.0000 ug | ORAL_TABLET | Freq: Every day | ORAL | Status: DC
  Administered 2021-05-11 – 2021-05-12 (×2): 100 ug via ORAL
  Filled 2021-05-11 (×3): qty 1

## 2021-05-11 MED ORDER — ROSUVASTATIN CALCIUM 5 MG PO TABS
5.0000 mg | ORAL_TABLET | Freq: Every day | ORAL | Status: DC
  Administered 2021-05-11: 5 mg via ORAL
  Filled 2021-05-11: qty 1

## 2021-05-11 MED ORDER — CINACALCET HCL 30 MG PO TABS
180.0000 mg | ORAL_TABLET | Freq: Every day | ORAL | Status: DC
  Administered 2021-05-11 – 2021-05-12 (×2): 180 mg via ORAL
  Filled 2021-05-11 (×3): qty 6

## 2021-05-11 NOTE — ED Notes (Signed)
Echo/US at [facility]. Will bladder scan afterwards to assess need for voiding. HD pt. Rarely makes urine. Also fall risk. Waiting on meds from pharmacy.

## 2021-05-11 NOTE — ED Notes (Signed)
Bladder scanner showed 0 mL of urine 3x

## 2021-05-11 NOTE — ED Provider Notes (Signed)
Assumed care from PA Soto at shift change.  See prior notes for full H&P.  Briefly, 55 y.o. M here with AMS since this AM.  Also found to have extensive bruising across the abdomen and chest from a presumed fall, this was unwitnessed.  Per wife, he is far off from baseline, not even able to get up and move around on his own.  He was dialyzed Saturday at usual session.  Work-up thus far including trauma scans are fairly unremarkable.  Renal function is at his baseline, normal potassium.  Ammonia and ethanol are normal.  Trauma scans are negative for acute injuries.  After discussion with neurology, recommended for MRI brain.  Plan:  MRI pending.  Will likely need admission for AMS.  Results for orders placed or performed during the hospital encounter of 05/10/21  CBC with Differential  Result Value Ref Range   WBC 7.3 4.0 - 10.5 K/uL   RBC 2.55 (L) 4.22 - 5.81 MIL/uL   Hemoglobin 8.1 (L) 13.0 - 17.0 g/dL   HCT 26.5 (L) 39.0 - 52.0 %   MCV 103.9 (H) 80.0 - 100.0 fL   MCH 31.8 26.0 - 34.0 pg   MCHC 30.6 30.0 - 36.0 g/dL   RDW 16.5 (H) 11.5 - 15.5 %   Platelets 162 150 - 400 K/uL   nRBC 0.0 0.0 - 0.2 %   Neutrophils Relative % 77 %   Neutro Abs 5.6 1.7 - 7.7 K/uL   Lymphocytes Relative 9 %   Lymphs Abs 0.7 0.7 - 4.0 K/uL   Monocytes Relative 10 %   Monocytes Absolute 0.7 0.1 - 1.0 K/uL   Eosinophils Relative 2 %   Eosinophils Absolute 0.2 0.0 - 0.5 K/uL   Basophils Relative 1 %   Basophils Absolute 0.1 0.0 - 0.1 K/uL   Immature Granulocytes 1 %   Abs Immature Granulocytes 0.06 0.00 - 0.07 K/uL  Brain natriuretic peptide  Result Value Ref Range   B Natriuretic Peptide 453.0 (H) 0.0 - 100.0 pg/mL  Ethanol  Result Value Ref Range   Alcohol, Ethyl (B) <09 <32 mg/dL  Salicylate level  Result Value Ref Range   Salicylate Lvl <3.5 (L) 7.0 - 30.0 mg/dL  Acetaminophen level  Result Value Ref Range   Acetaminophen (Tylenol), Serum <10 (L) 10 - 30 ug/mL  Protime-INR  Result Value Ref Range    Prothrombin Time 19.8 (H) 11.4 - 15.2 seconds   INR 1.7 (H) 0.8 - 1.2  Ammonia  Result Value Ref Range   Ammonia 33 9 - 35 umol/L  Comprehensive metabolic panel  Result Value Ref Range   Sodium 131 (L) 135 - 145 mmol/L   Potassium 4.9 3.5 - 5.1 mmol/L   Chloride 90 (L) 98 - 111 mmol/L   CO2 24 22 - 32 mmol/L   Glucose, Bld 92 70 - 99 mg/dL   BUN 51 (H) 6 - 20 mg/dL   Creatinine, Ser 8.88 (H) 0.61 - 1.24 mg/dL   Calcium 6.5 (L) 8.9 - 10.3 mg/dL   Total Protein 7.3 6.5 - 8.1 g/dL   Albumin 3.7 3.5 - 5.0 g/dL   AST 47 (H) 15 - 41 U/L   ALT 25 0 - 44 U/L   Alkaline Phosphatase 55 38 - 126 U/L   Total Bilirubin 0.9 0.3 - 1.2 mg/dL   GFR, Estimated 6 (L) >60 mL/min   Anion gap 17 (H) 5 - 15  Magnesium  Result Value Ref Range   Magnesium 2.3  1.7 - 2.4 mg/dL  Lactic acid, plasma  Result Value Ref Range   Lactic Acid, Venous 1.6 0.5 - 1.9 mmol/L  Hemoglobin and hematocrit, blood  Result Value Ref Range   Hemoglobin 7.8 (L) 13.0 - 17.0 g/dL   HCT 26.4 (L) 39.0 - 52.0 %  Troponin I (High Sensitivity)  Result Value Ref Range   Troponin I (High Sensitivity) 50 (H) <18 ng/L   DG Chest 2 View  Result Date: 05/10/2021 CLINICAL DATA:  Chest pain EXAM: CHEST - 2 VIEW COMPARISON:  October 13, 2020 FINDINGS: There is cardiomegaly with pulmonary vascularity within normal limits. No edema or airspace opacity. No adenopathy. No bone lesions. IMPRESSION: Cardiomegaly.  No edema or airspace opacity. Electronically Signed   By: Lowella Grip III M.D.   On: 05/10/2021 16:35   CT Head Wo Contrast  Result Date: 05/10/2021 CLINICAL DATA:  Fall and hit head Coumadin EXAM: CT HEAD WITHOUT CONTRAST CT CERVICAL SPINE WITHOUT CONTRAST TECHNIQUE: Multidetector CT imaging of the head and cervical spine was performed following the standard protocol without intravenous contrast. Multiplanar CT image reconstructions of the cervical spine were also generated. COMPARISON:  CT brain 10/13/2020, CT brain and  cervical spine 07/17/2018, MRI 10/14/2020 FINDINGS: CT HEAD FINDINGS Brain: No acute territorial infarction, hemorrhage, or intracranial mass. Mild atrophy. Nonenlarged ventricles. Vascular: No hyperdense vessels.  No unexpected calcification Skull: Normal. Negative for fracture or focal lesion. Sinuses/Orbits: No acute finding. Other: None CT CERVICAL SPINE FINDINGS Alignment: There is motion degradation. Straightening of the cervical spine. Note that the patient was images to mid T1. Skull base and vertebrae: No acute fracture. No primary bone lesion or focal pathologic process. Soft tissues and spinal canal: No prevertebral fluid or swelling. No visible canal hematoma. Disc levels: Moderate disk space narrowing atC4/C5, C5/C6, and C6/C7. Irregular facet degenerative changes on the right at C2/C3 Upper chest: Negative. Other: None IMPRESSION: 1. No CT evidence for acute intracranial abnormality. Mild atrophy. 2. Limited CT cervical spine CT due to motion and incomplete inclusion of T1. No acute osseous abnormality within the visible portions of the cervical spine. Electronically Signed   By: Donavan Foil M.D.   On: 05/10/2021 17:40   CT Cervical Spine Wo Contrast  Result Date: 05/10/2021 CLINICAL DATA:  Fall and hit head Coumadin EXAM: CT HEAD WITHOUT CONTRAST CT CERVICAL SPINE WITHOUT CONTRAST TECHNIQUE: Multidetector CT imaging of the head and cervical spine was performed following the standard protocol without intravenous contrast. Multiplanar CT image reconstructions of the cervical spine were also generated. COMPARISON:  CT brain 10/13/2020, CT brain and cervical spine 07/17/2018, MRI 10/14/2020 FINDINGS: CT HEAD FINDINGS Brain: No acute territorial infarction, hemorrhage, or intracranial mass. Mild atrophy. Nonenlarged ventricles. Vascular: No hyperdense vessels.  No unexpected calcification Skull: Normal. Negative for fracture or focal lesion. Sinuses/Orbits: No acute finding. Other: None CT CERVICAL  SPINE FINDINGS Alignment: There is motion degradation. Straightening of the cervical spine. Note that the patient was images to mid T1. Skull base and vertebrae: No acute fracture. No primary bone lesion or focal pathologic process. Soft tissues and spinal canal: No prevertebral fluid or swelling. No visible canal hematoma. Disc levels: Moderate disk space narrowing atC4/C5, C5/C6, and C6/C7. Irregular facet degenerative changes on the right at C2/C3 Upper chest: Negative. Other: None IMPRESSION: 1. No CT evidence for acute intracranial abnormality. Mild atrophy. 2. Limited CT cervical spine CT due to motion and incomplete inclusion of T1. No acute osseous abnormality within the visible portions of the  cervical spine. Electronically Signed   By: Donavan Foil M.D.   On: 05/10/2021 17:40   MR BRAIN WO CONTRAST  Result Date: 05/11/2021 CLINICAL DATA:  Head trauma and altered mental status. EXAM: MRI HEAD WITHOUT CONTRAST TECHNIQUE: Multiplanar, multiecho pulse sequences of the brain and surrounding structures were obtained without intravenous contrast. COMPARISON:  None. FINDINGS: Brain: No acute infarct, mass effect or extra-axial collection. No acute or chronic hemorrhage. There is multifocal hyperintense T2-weighted signal within the white matter. Parenchymal volume and CSF spaces are normal. The midline structures are normal. Vascular: Major flow voids are preserved. Skull and upper cervical spine: Normal calvarium and skull base. Visualized upper cervical spine and soft tissues are normal. Sinuses/Orbits:No paranasal sinus fluid levels or advanced mucosal thickening. No mastoid or middle ear effusion. Normal orbits. IMPRESSION: 1. No acute intracranial abnormality. 2. Multifocal hyperintense T2-weighted signal within the white matter, most consistent with chronic small vessel ischemic disease. Electronically Signed   By: Ulyses Jarred M.D.   On: 05/11/2021 01:07   CT CHEST ABDOMEN PELVIS W  CONTRAST  Addendum Date: 05/10/2021   ADDENDUM REPORT: 05/10/2021 22:12 ADDENDUM: Additional impressions. IMPRESSION: Small amount of free fluid adjacent to the liver, nonspecific. No acute cardiopulmonary disease. Electronically Signed   By: Rolm Baptise M.D.   On: 05/10/2021 22:12   Result Date: 05/10/2021 CLINICAL DATA:  Left chest pain, fall EXAM: CT CHEST, ABDOMEN, AND PELVIS WITH CONTRAST TECHNIQUE: Multidetector CT imaging of the chest, abdomen and pelvis was performed following the standard protocol during bolus administration of intravenous contrast. CONTRAST:  153mL OMNIPAQUE IOHEXOL 300 MG/ML  SOLN COMPARISON:  07/17/2018 FINDINGS: CT CHEST FINDINGS Cardiovascular: Diffusely calcified coronary arteries. Scattered aortic calcifications. No aneurysm. Heart is borderline in size. Mediastinum/Nodes: No mediastinal, hilar, or axillary adenopathy. Trachea and esophagus are unremarkable. Thyroid unremarkable. Lungs/Pleura: Lungs are clear. No focal airspace opacities or suspicious nodules. No effusions. Musculoskeletal: Chest wall soft tissues are unremarkable. No acute bony abnormality. CT ABDOMEN PELVIS FINDINGS Hepatobiliary: Prior cholecystectomy. Calcified cystic structure within the gallbladder fossa is similar to prior study. No biliary ductal dilatation. No focal hepatic abnormality. Pancreas: No focal abnormality or ductal dilatation. Spleen: No focal abnormality.  Normal size. Adrenals/Urinary Tract: 2.4 cm right adrenal nodule compared with 2.6 cm previously. Diffuse renal atrophy compatible with in stage renal disease. No hydronephrosis. Urinary bladder unremarkable. Stomach/Bowel: Normal appendix. Stomach, large and small bowel grossly unremarkable. Vascular/Lymphatic: Heavily calcified aorta and iliac vessels. No evidence of aneurysm or adenopathy. Reproductive: No visible focal abnormality. Other: Small amount of perihepatic ascites. Musculoskeletal: Mild compression fractures through the  superior endplates at L2 and L4, progressed since prior study. IMPRESSION: Renal atrophy compatible with in stage renal disease. Diffuse coronary artery disease, aortic atherosclerosis. Rim calcified structure in the gallbladder fossa adjacent to cholecystectomy clips, likely postsurgical collection, stable since prior study. No acute findings in the abdomen or pelvis. Electronically Signed: By: Rolm Baptise M.D. On: 05/10/2021 22:06    MRI brain is negative.  Patient reassessed-- he is still very somnolent appearing, mumbling speech and cannot really tell me why he is here in the hospital.  He is on chronic controlled substances, none administered in the last 10 hours but overall condition remains unchanged.  He will require admission for ongoing care.    Discussed with Dr. Hal Hope-- will admit for ongoing care.   Larene Pickett, PA-C 05/11/21 0140    Tegeler, Gwenyth Allegra, MD 05/13/21 [phone]
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
## 2021-05-11 NOTE — ED Notes (Signed)
Returned wifes call. Updated wife per pt request.

## 2021-05-11 NOTE — Consult Note (Addendum)
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 hemodialysis on Tuesday, Thursday and Saturdays.  He occasionally misses dialysis sessions.  He has had multiple cardioversions that were unsuccessful and remains in A. fib on Coumadin anticoagulation.  He had a Myoview stress test performed last year that showed diaphragmatic attenuation without ischemia and echo at the same time that did not show regional wall motion abnormality with preserved EF.  He was admitted here because of multiple falls, confusion and altered mental status.  His troponins were in the mid 50s and flat.  His EKG showed no acute changes.  His wife is in the patient stated he had a sharp chest pain that did not sound  ischemic.  His exam is benign today.  Does have some peripheral edema.  I do not think he needs further cardiovascular evaluation at this time.  Neuro eval is ongoing.  He is scheduled for dialysis today.  Lorretta Harp, M.D., [place], [facility], [place] [address] [place],   [address]  [phone] 05/11/2021 2:05 PM
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
## 2021-05-11 NOTE — ED Notes (Signed)
Attending/ admitting MD re-paged

## 2021-05-11 NOTE — ED Notes (Signed)
Pt alert, slow to respond, garbled /slurred speech continues, interactive and appropriate, breakfast eaten, up to chair and back to bed, linens changed, fall risk measures placed (band, sign, socks). CBIR. Phone in reach. meds given.

## 2021-05-11 NOTE — ED Notes (Signed)
Echo in process 

## 2021-05-11 NOTE — ED Notes (Signed)
Report given to [place], RN receiving pt.

## 2021-05-11 NOTE — ED Notes (Signed)
Back to bed from [facility]. No output. Wife at [facility]. Returned to monitor. VSS.

## 2021-05-11 NOTE — ED Notes (Signed)
Speaking on phone with wife

## 2021-05-11 NOTE — Progress Notes (Addendum)
PROGRESS NOTE  Steven Best DZH:[id number] DOB: 1966-01-04 DOA: 05/10/2021 PCP: Shirline Frees, MD  HPI/Recap of past 24 hours: HPI from Dr Frederich Cha is a 55 y.o. male with history of ESRD on hemodialysis on Tuesdays Thursdays and Saturday A. fib, anemia, hyperlipidemia was brought to the ER because of increasing confusion, slurred speech and left-sided chest pain.  Patient also had a fall. In the ED, patient appeared mildly lethargic.  EKG showed A. fib rate, controlled and patient underwent scans including CT scan of the head and C-spine chest abdomen pelvis, which did not show anything acute.  MRI brain did not show anything acute.  On exam patient had some ecchymotic areas on the abdomen of which he was unaware. High sensitive troponin was 50, Cardiology was consulted. COVID test was negative.  INR was subtherapeutic at 1.7.  Acetaminophen levels and salicylate levels are negative.  Patient admitted for further management.     Today, patient denies any further left-sided chest pain, complaining of upper, lower back pain and bilateral LE pain.  Still noted to be mildly lethargic, with mild slurred speech.  Requesting pain medications.  Denies any nausea/vomiting, abdominal pain, fever/chills.   Assessment/Plan: Principal Problem:   Acute encephalopathy Active Problems:   End stage renal disease (HCC)   Paroxysmal atrial fibrillation (HCC)   Possible acute metabolic encephalopathy Currently afebrile, with no leukocytosis Suspect possible polypharmacy (on gabapentin, hydrocodone, Klonopin, Flexeril) in the setting of ESRD Vs unlikely uremia as patient has been compliant with dialysis MRI brain unremarkable, for any acute CVA PT/OT Monitor closely  Left-sided chest pain, ?Atypical Troponin with flat trend 50-->51-->48 EKG showed A. fib BNP 453 Echo pending Cardiology consulted, recommend medical management  ESRD HD as per nephrology, T/T/S Daily  renal panel  Anemia of chronic kidney disease Hemoglobin stable around baseline Daily CBC  Chronic A. Fib Currently rate controlled Continue Coumadin  Obesity Lifestyle modification advised     Estimated body mass index is 39.54 kg/m as calculated from the following:   Height as of this encounter: 6\' 2"  (1.88 m).   Weight as of this encounter: 139.7 kg.     Code Status: Full  Family Communication: None at bedside  Disposition Plan: Status is: Observation  The patient remains OBS appropriate and will d/c before 2 midnights.  Dispo: The patient is from: Home              Anticipated d/c is to: Home              Patient currently is not medically stable to d/c.   Difficult to place patient No    Consultants: Nephrology   Procedures: None  Antimicrobials: None  DVT prophylaxis:  Warfarin   Objective: Vitals:   05/11/21 1215 05/11/21 1230 05/11/21 1245 05/11/21 1345  BP: (!) 82/72 (!) 103/54 (!) 117/96 (!) 106/53  Pulse: 74 77 73 73  Resp: 20 18 13  (!) 21  Temp:    98.2 F (36.8 C)  TempSrc:    Oral  SpO2: 100% 98% 96% 99%  Weight:      Height:       No intake or output data in the 24 hours ending 05/11/21 1348 Filed Weights   05/10/21 1552  Weight: (!) 139.7 kg    Exam: General: NAD, lethargic, some slurred speech Cardiovascular: S1, S2 present Respiratory: CTAB Abdomen: Soft, nontender, nondistended, bowel sounds present, noted abdominal bruising Musculoskeletal: 2+ bilateral pedal edema noted Skin: Normal Psychiatry:  Normal mood     Data Reviewed: CBC: Recent Labs  Lab 05/10/21 1610 05/10/21 2307  WBC 7.3  --   NEUTROABS 5.6  --   HGB 8.1* 7.8*  HCT 26.5* 26.4*  MCV 103.9*  --   PLT 162  --    Basic Metabolic Panel: Recent Labs  Lab 05/10/21 1800  NA 131*  K 4.9  CL 90*  CO2 24  GLUCOSE 92  BUN 51*  CREATININE 8.88*  CALCIUM 6.5*  MG 2.3   GFR: Estimated Creatinine Clearance: 14 mL/min (A) (by C-G formula based  on SCr of 8.88 mg/dL (H)). Liver Function Tests: Recent Labs  Lab 05/10/21 1800  AST 47*  ALT 25  ALKPHOS 55  BILITOT 0.9  PROT 7.3  ALBUMIN 3.7   No results for input(s): LIPASE, AMYLASE in the last 168 hours. Recent Labs  Lab 05/10/21 1800  AMMONIA 33   Coagulation Profile: Recent Labs  Lab 05/10/21 1800  INR 1.7*   Cardiac Enzymes: No results for input(s): CKTOTAL, CKMB, CKMBINDEX, TROPONINI in the last 168 hours. BNP (last 3 results) No results for input(s): PROBNP in the last 8760 hours. HbA1C: No results for input(s): HGBA1C in the last 72 hours. CBG: No results for input(s): GLUCAP in the last 168 hours. Lipid Profile: No results for input(s): CHOL, HDL, LDLCALC, TRIG, CHOLHDL, LDLDIRECT in the last 72 hours. Thyroid Function Tests: No results for input(s): TSH, T4TOTAL, FREET4, T3FREE, THYROIDAB in the last 72 hours. Anemia Panel: No results for input(s): VITAMINB12, FOLATE, FERRITIN, TIBC, IRON, RETICCTPCT in the last 72 hours. Urine analysis:    Component Value Date/Time   COLORURINE YELLOW 09/19/2012 [place] 09/19/2012 1434   LABSPEC 1.006 09/19/2012 1434   PHURINE 5.5 09/19/2012 1434   GLUCOSEU NEGATIVE 09/19/2012 1434   HGBUR NEGATIVE 09/19/2012 1434   [place] 09/19/2012 1434   KETONESUR NEGATIVE 09/19/2012 1434   PROTEINUR NEGATIVE 09/19/2012 1434   UROBILINOGEN 0.2 09/19/2012 1434   NITRITE NEGATIVE 09/19/2012 1434   LEUKOCYTESUR NEGATIVE 09/19/2012 1434   Sepsis Labs: @LABRCNTIP (procalcitonin:4,lacticidven:4)  ) Recent Results (from the past 240 hour(s))  Resp Panel by RT-PCR (Flu A&B, Covid) Nasopharyngeal Swab     Status: None   Collection Time: 05/11/21  2:24 AM   Specimen: Nasopharyngeal Swab; Nasopharyngeal(NP) swabs in vial transport medium  Result Value Ref Range Status   SARS Coronavirus 2 by RT PCR NEGATIVE NEGATIVE Final    Comment: (NOTE) SARS-CoV-2 target nucleic acids are NOT DETECTED.  The  SARS-CoV-2 RNA is generally detectable in upper respiratory specimens during the acute phase of infection. The lowest concentration of SARS-CoV-2 viral copies this assay can detect is 138 copies/mL. A negative result does not preclude SARS-Cov-2 infection and should not be used as the sole basis for treatment or other patient management decisions. A negative result may occur with  improper specimen collection/handling, submission of specimen other than nasopharyngeal swab, presence of viral mutation(s) within the areas targeted by this assay, and inadequate number of viral copies(<138 copies/mL). A negative result must be combined with clinical observations, patient history, and epidemiological information. The expected result is Negative.  Fact Sheet for Patients:  EntrepreneurPulse.com.au  Fact Sheet for Healthcare Providers:  IncredibleEmployment.be  This test is no t yet approved or cleared by the [place] FDA and  has been authorized for detection and/or diagnosis of SARS-CoV-2 by FDA under an Emergency Use Authorization (EUA). This EUA will remain  in effect (meaning this test  can be used) for the duration of the COVID-19 declaration under Section 564(b)(1) of the Act, 21 U.S.C.section 360bbb-3(b)(1), unless the authorization is terminated  or revoked sooner.       Influenza A by PCR NEGATIVE NEGATIVE Final   Influenza B by PCR NEGATIVE NEGATIVE Final    Comment: (NOTE) The Xpert Xpress SARS-CoV-2/FLU/RSV plus assay is intended as an aid in the diagnosis of influenza from Nasopharyngeal swab specimens and should not be used as a sole basis for treatment. Nasal washings and aspirates are unacceptable for Xpert Xpress SARS-CoV-2/FLU/RSV testing.  Fact Sheet for Patients: EntrepreneurPulse.com.au  Fact Sheet for Healthcare Providers: IncredibleEmployment.be  This test is not yet approved or  cleared by the [place] FDA and has been authorized for detection and/or diagnosis of SARS-CoV-2 by FDA under an Emergency Use Authorization (EUA). This EUA will remain in effect (meaning this test can be used) for the duration of the COVID-19 declaration under Section 564(b)(1) of the Act, 21 U.S.C. section 360bbb-3(b)(1), unless the authorization is terminated or revoked.  Performed at [facility], [place] [address], [place], [place] [address]       Studies: DG Chest 2 View  Result Date: 05/10/2021 CLINICAL DATA:  Chest pain EXAM: CHEST - 2 VIEW COMPARISON:  October 13, 2020 FINDINGS: There is cardiomegaly with pulmonary vascularity within normal limits. No edema or airspace opacity. No adenopathy. No bone lesions. IMPRESSION: Cardiomegaly.  No edema or airspace opacity. Electronically Signed   By: Lowella Grip III M.D.   On: 05/10/2021 16:35   CT Head Wo Contrast  Result Date: 05/10/2021 CLINICAL DATA:  Fall and hit head Coumadin EXAM: CT HEAD WITHOUT CONTRAST CT CERVICAL SPINE WITHOUT CONTRAST TECHNIQUE: Multidetector CT imaging of the head and cervical spine was performed following the standard protocol without intravenous contrast. Multiplanar CT image reconstructions of the cervical spine were also generated. COMPARISON:  CT brain 10/13/2020, CT brain and cervical spine 07/17/2018, MRI 10/14/2020 FINDINGS: CT HEAD FINDINGS Brain: No acute territorial infarction, hemorrhage, or intracranial mass. Mild atrophy. Nonenlarged ventricles. Vascular: No hyperdense vessels.  No unexpected calcification Skull: Normal. Negative for fracture or focal lesion. Sinuses/Orbits: No acute finding. Other: None CT CERVICAL SPINE FINDINGS Alignment: There is motion degradation. Straightening of the cervical spine. Note that the patient was images to mid T1. Skull base and vertebrae: No acute fracture. No primary bone lesion or focal pathologic process. Soft tissues and spinal canal: No  prevertebral fluid or swelling. No visible canal hematoma. Disc levels: Moderate disk space narrowing atC4/C5, C5/C6, and C6/C7. Irregular facet degenerative changes on the right at C2/C3 Upper chest: Negative. Other: None IMPRESSION: 1. No CT evidence for acute intracranial abnormality. Mild atrophy. 2. Limited CT cervical spine CT due to motion and incomplete inclusion of T1. No acute osseous abnormality within the visible portions of the cervical spine. Electronically Signed   By: Donavan Foil M.D.   On: 05/10/2021 17:40   CT Cervical Spine Wo Contrast  Result Date: 05/10/2021 CLINICAL DATA:  Fall and hit head Coumadin EXAM: CT HEAD WITHOUT CONTRAST CT CERVICAL SPINE WITHOUT CONTRAST TECHNIQUE: Multidetector CT imaging of the head and cervical spine was performed following the standard protocol without intravenous contrast. Multiplanar CT image reconstructions of the cervical spine were also generated. COMPARISON:  CT brain 10/13/2020, CT brain and cervical spine 07/17/2018, MRI 10/14/2020 FINDINGS: CT HEAD FINDINGS Brain: No acute territorial infarction, hemorrhage, or intracranial mass. Mild atrophy. Nonenlarged ventricles. Vascular: No hyperdense vessels.  No unexpected  calcification Skull: Normal. Negative for fracture or focal lesion. Sinuses/Orbits: No acute finding. Other: None CT CERVICAL SPINE FINDINGS Alignment: There is motion degradation. Straightening of the cervical spine. Note that the patient was images to mid T1. Skull base and vertebrae: No acute fracture. No primary bone lesion or focal pathologic process. Soft tissues and spinal canal: No prevertebral fluid or swelling. No visible canal hematoma. Disc levels: Moderate disk space narrowing atC4/C5, C5/C6, and C6/C7. Irregular facet degenerative changes on the right at C2/C3 Upper chest: Negative. Other: None IMPRESSION: 1. No CT evidence for acute intracranial abnormality. Mild atrophy. 2. Limited CT cervical spine CT due to motion and  incomplete inclusion of T1. No acute osseous abnormality within the visible portions of the cervical spine. Electronically Signed   By: Donavan Foil M.D.   On: 05/10/2021 17:40   MR BRAIN WO CONTRAST  Result Date: 05/11/2021 CLINICAL DATA:  Head trauma and altered mental status. EXAM: MRI HEAD WITHOUT CONTRAST TECHNIQUE: Multiplanar, multiecho pulse sequences of the brain and surrounding structures were obtained without intravenous contrast. COMPARISON:  None. FINDINGS: Brain: No acute infarct, mass effect or extra-axial collection. No acute or chronic hemorrhage. There is multifocal hyperintense T2-weighted signal within the white matter. Parenchymal volume and CSF spaces are normal. The midline structures are normal. Vascular: Major flow voids are preserved. Skull and upper cervical spine: Normal calvarium and skull base. Visualized upper cervical spine and soft tissues are normal. Sinuses/Orbits:No paranasal sinus fluid levels or advanced mucosal thickening. No mastoid or middle ear effusion. Normal orbits. IMPRESSION: 1. No acute intracranial abnormality. 2. Multifocal hyperintense T2-weighted signal within the white matter, most consistent with chronic small vessel ischemic disease. Electronically Signed   By: Ulyses Jarred M.D.   On: 05/11/2021 01:07   CT CHEST ABDOMEN PELVIS W CONTRAST  Addendum Date: 05/10/2021   ADDENDUM REPORT: 05/10/2021 22:12 ADDENDUM: Additional impressions. IMPRESSION: Small amount of free fluid adjacent to the liver, nonspecific. No acute cardiopulmonary disease. Electronically Signed   By: Rolm Baptise M.D.   On: 05/10/2021 22:12   Result Date: 05/10/2021 CLINICAL DATA:  Left chest pain, fall EXAM: CT CHEST, ABDOMEN, AND PELVIS WITH CONTRAST TECHNIQUE: Multidetector CT imaging of the chest, abdomen and pelvis was performed following the standard protocol during bolus administration of intravenous contrast. CONTRAST:  138mL OMNIPAQUE IOHEXOL 300 MG/ML  SOLN COMPARISON:   07/17/2018 FINDINGS: CT CHEST FINDINGS Cardiovascular: Diffusely calcified coronary arteries. Scattered aortic calcifications. No aneurysm. Heart is borderline in size. Mediastinum/Nodes: No mediastinal, hilar, or axillary adenopathy. Trachea and esophagus are unremarkable. Thyroid unremarkable. Lungs/Pleura: Lungs are clear. No focal airspace opacities or suspicious nodules. No effusions. Musculoskeletal: Chest wall soft tissues are unremarkable. No acute bony abnormality. CT ABDOMEN PELVIS FINDINGS Hepatobiliary: Prior cholecystectomy. Calcified cystic structure within the gallbladder fossa is similar to prior study. No biliary ductal dilatation. No focal hepatic abnormality. Pancreas: No focal abnormality or ductal dilatation. Spleen: No focal abnormality.  Normal size. Adrenals/Urinary Tract: 2.4 cm right adrenal nodule compared with 2.6 cm previously. Diffuse renal atrophy compatible with in stage renal disease. No hydronephrosis. Urinary bladder unremarkable. Stomach/Bowel: Normal appendix. Stomach, large and small bowel grossly unremarkable. Vascular/Lymphatic: Heavily calcified aorta and iliac vessels. No evidence of aneurysm or adenopathy. Reproductive: No visible focal abnormality. Other: Small amount of perihepatic ascites. Musculoskeletal: Mild compression fractures through the superior endplates at L2 and L4, progressed since prior study. IMPRESSION: Renal atrophy compatible with in stage renal disease. Diffuse coronary artery disease, aortic atherosclerosis. Rim calcified structure in  the gallbladder fossa adjacent to cholecystectomy clips, likely postsurgical collection, stable since prior study. No acute findings in the abdomen or pelvis. Electronically Signed: By: Rolm Baptise M.D. On: 05/10/2021 22:06    Scheduled Meds:  amiodarone  200 mg Oral Daily   cinacalcet  180 mg Oral Q breakfast   escitalopram  5 mg Oral Daily   ferric citrate  840 mg Oral TID WC   rosuvastatin  5 mg Oral QHS    cyanocobalamin  100 mcg Oral Daily   warfarin  7.5 mg Oral ONCE-1600   Warfarin - Pharmacist Dosing Inpatient   Does not apply q1600    Continuous Infusions:   LOS: 0 days     Alma Friendly, MD [facility]  If 7PM-7AM, please contact night-coverage [email or web address] 05/11/2021, 1:48 PM

## 2021-05-11 NOTE — Progress Notes (Signed)
New Admission Note:  Arrival Method: Bed Mental Orientation: Alert and oriented x 4 Telemetry: Box 12 Assessment: Completed Skin: Warm and dry IV: NSL Pain:Denies Tubes: N/A Safety Measures: Safety Fall Prevention Plan initiated.  Admission: Completed 5 M  Orientation: Patient has been orientated to the room, unit and the staff. Welcome booklet given.  Family: N/A  Orders have been reviewed and implemented. Will continue to monitor the patient. Call light has been placed within reach and bed alarm has been activated.   Sima Matas BSN, RN  Phone Number: [phone]

## 2021-05-11 NOTE — ED Notes (Addendum)
Echo/US complete, cardiology at [facility]

## 2021-05-11 NOTE — Progress Notes (Signed)
New Admission Note:   Arrival Method: Arrived from [facility] ED via stretcher Mental Orientation: Alert and oriented x4 Telemetry: Box #12 Assessment: Completed Skin: Ecchymosis all over body IV: NSL- Rt FA Pain: 0/10 Tubes: N/A Safety Measures: Safety Fall Prevention Plan has been discussed.  Admission: Completed 5MW Orientation: Patient has been oriented to the room, unit and staff.  Family: None at bedside  Orders have been reviewed and implemented. Will continue to monitor the patient. Call light has been placed within reach and bed alarm has been activated.   [place] Every [employer], RN-BC Phone number: [phone]

## 2021-05-11 NOTE — Progress Notes (Signed)
Asked to see patient for ESRD. TTS HD. Orders in for dialysis tonight. Will do formal consult tomorrow.   Kelly Splinter, MD 05/11/2021, 5:23 PM

## 2021-05-12 DIAGNOSIS — G9340 Encephalopathy, unspecified: Secondary | ICD-10-CM | POA: Diagnosis not present

## 2021-05-12 DIAGNOSIS — I480 Paroxysmal atrial fibrillation: Secondary | ICD-10-CM

## 2021-05-12 DIAGNOSIS — R531 Weakness: Secondary | ICD-10-CM | POA: Diagnosis not present

## 2021-05-12 DIAGNOSIS — N186 End stage renal disease: Secondary | ICD-10-CM

## 2021-05-12 LAB — CBC WITH DIFFERENTIAL/PLATELET
Abs Immature Granulocytes: 0.06 10*3/uL (ref 0.00–0.07)
Basophils Absolute: 0.1 10*3/uL (ref 0.0–0.1)
Basophils Relative: 1 %
Eosinophils Absolute: 0.1 10*3/uL (ref 0.0–0.5)
Eosinophils Relative: 2 %
HCT: 25.9 % — ABNORMAL LOW (ref 39.0–52.0)
Hemoglobin: 8.3 g/dL — ABNORMAL LOW (ref 13.0–17.0)
Immature Granulocytes: 1 %
Lymphocytes Relative: 5 %
Lymphs Abs: 0.4 10*3/uL — ABNORMAL LOW (ref 0.7–4.0)
MCH: 31.9 pg (ref 26.0–34.0)
MCHC: 32 g/dL (ref 30.0–36.0)
MCV: 99.6 fL (ref 80.0–100.0)
Monocytes Absolute: 0.6 10*3/uL (ref 0.1–1.0)
Monocytes Relative: 9 %
Neutro Abs: 5.5 10*3/uL (ref 1.7–7.7)
Neutrophils Relative %: 82 %
Platelets: 182 10*3/uL (ref 150–400)
RBC: 2.6 MIL/uL — ABNORMAL LOW (ref 4.22–5.81)
RDW: 16.3 % — ABNORMAL HIGH (ref 11.5–15.5)
WBC: 6.7 10*3/uL (ref 4.0–10.5)
nRBC: 0 % (ref 0.0–0.2)

## 2021-05-12 LAB — PROTIME-INR
INR: 2.3 — ABNORMAL HIGH (ref 0.8–1.2)
Prothrombin Time: 25.7 seconds — ABNORMAL HIGH (ref 11.4–15.2)

## 2021-05-12 LAB — RENAL FUNCTION PANEL
Albumin: 3.4 g/dL — ABNORMAL LOW (ref 3.5–5.0)
Anion gap: 15 (ref 5–15)
BUN: 28 mg/dL — ABNORMAL HIGH (ref 6–20)
CO2: 28 mmol/L (ref 22–32)
Calcium: 7.3 mg/dL — ABNORMAL LOW (ref 8.9–10.3)
Chloride: 92 mmol/L — ABNORMAL LOW (ref 98–111)
Creatinine, Ser: 5.47 mg/dL — ABNORMAL HIGH (ref 0.61–1.24)
GFR, Estimated: 12 mL/min — ABNORMAL LOW (ref >60)
Glucose, Bld: 86 mg/dL (ref 70–99)
Phosphorus: 4.3 mg/dL (ref 2.5–4.6)
Potassium: 3.6 mmol/L (ref 3.5–5.1)
Sodium: 135 mmol/L (ref 135–145)

## 2021-05-12 LAB — MRSA NEXT GEN BY PCR, NASAL: MRSA by PCR Next Gen: DETECTED — AB

## 2021-05-12 MED ORDER — CLONAZEPAM 2 MG PO TABS: 1.0000 mg | ORAL_TABLET | Freq: Every day | ORAL | 0 refills | Status: DC

## 2021-05-12 MED ORDER — CHLORHEXIDINE GLUCONATE CLOTH 2 % EX PADS
6.0000 | MEDICATED_PAD | Freq: Every day | CUTANEOUS | Status: DC
  Administered 2021-05-12: 6 via TOPICAL

## 2021-05-12 MED ORDER — WARFARIN SODIUM 5 MG PO TABS: 5.0000 mg | ORAL_TABLET | Freq: Every day | ORAL | Status: DC

## 2021-05-12 MED ORDER — ROPINIROLE HCL 0.25 MG PO TABS: 0.2500 mg | ORAL_TABLET | Freq: Every day | ORAL | 0 refills | Status: AC

## 2021-05-12 MED ORDER — MUPIROCIN 2 % EX OINT
1.0000 "application " | TOPICAL_OINTMENT | Freq: Two times a day (BID) | CUTANEOUS | Status: DC
  Administered 2021-05-12: 1 via NASAL
  Filled 2021-05-12: qty 22

## 2021-05-12 NOTE — Evaluation (Signed)
Occupational Therapy Evaluation Patient Details Name: Steven Best MRN: [id number] DOB: 01-31-1966 Today's Date: 05/12/2021    History of Present Illness Pt adm 6/14 with confusion, falls, slurred speech, and chest pain. CT and MRI of head both negative for acute changes. Pt with possible acute metabolic encephalopathy. Cardiology consulted without acute findings and signed off. PMH - ESRD on HD, afib, anemia   Clinical Impression   Pt at Ind - Mod I level with ADLs/selfcare and mobility. Pt with c/o dizziness while up walking to bathroom ad returned to sitting EOB with BP reading at 84/47. Pt returned to bed and RN notified. No further acute OT services are indicated at this time. OT will sign off    Follow Up Recommendations  No OT follow up    Equipment Recommendations  None recommended by OT    Recommendations for Other Services       Precautions / Restrictions Restrictions Weight Bearing Restrictions: No      Mobility Bed Mobility Overal bed mobility: Independent                  Transfers Overall transfer level: Modified independent Equipment used: None Transfers: Sit to/from Stand Sit to Stand: Modified independent (Device/Increase time)         General transfer comment: pt c/o dizziness walking bathroom. Pt returned to sit EOB to take BP, reading at 84/47    Balance Overall balance assessment: Mild deficits observed, not formally tested                                         ADL either performed or assessed with clinical judgement   ADL Overall ADL's : Modified independent                                             Vision Baseline Vision/History: Wears glasses Patient Visual Report: No change from baseline       Perception     Praxis      Pertinent Vitals/Pain Pain Assessment: No/denies pain     Hand Dominance Right   Extremity/Trunk Assessment Upper Extremity Assessment Upper Extremity  Assessment: Overall WFL for tasks assessed   Lower Extremity Assessment Lower Extremity Assessment: Defer to PT evaluation       Communication Communication Communication: No difficulties   Cognition Arousal/Alertness: Awake/alert Behavior During Therapy: WFL for tasks assessed/performed Overall Cognitive Status: No family/caregiver present to determine baseline cognitive functioning                                     General Comments       Exercises     Shoulder Instructions      Home Living Family/patient expects to be discharged to:: Private residence Living Arrangements: Spouse/significant other Available Help at Discharge: Family;Available 24 hours/day Type of Home: House Home Access: Stairs to enter [employer] of Steps: 5 Entrance Stairs-Rails: Right;Left Home Layout: One level     Bathroom Shower/Tub: Occupational psychologist: Standard     Home Equipment: None          Prior Functioning/Environment Level of Independence: Independent  OT Problem List: Decreased activity tolerance      OT Treatment/Interventions:      OT Goals(Current goals can be found in the care plan section) Acute Rehab OT Goals Patient Stated Goal: go home  OT Frequency:     Barriers to D/C:            Co-evaluation              AM-PAC OT "6 Clicks" Daily Activity     Outcome Measure Help from another person eating meals?: None Help from another person taking care of personal grooming?: None Help from another person toileting, which includes using toliet, bedpan, or urinal?: None Help from another person bathing (including washing, rinsing, drying)?: None Help from another person to put on and taking off regular upper body clothing?: None Help from another person to put on and taking off regular lower body clothing?: None 6 Click Score: 24   End of Session Equipment Utilized During Treatment: Gait  belt Nurse Communication: Mobility status;Other (comment) (low BP)  Activity Tolerance: Other (comment) (c/o dizziness) Patient left: in bed;with call bell/phone within reach;with bed alarm set  OT Visit Diagnosis: Other abnormalities of gait and mobility (R26.89)                Time: 7846-9629 OT Time Calculation (min): 23 min Charges:  OT General Charges $OT Visit: 1 Visit OT Evaluation $OT Eval Low Complexity: 1 Low OT Treatments $Self Care/Home Management : 8-22 mins    Britt Bottom 05/12/2021, 2:05 PM

## 2021-05-12 NOTE — Discharge Summary (Signed)
Discharge Summary  Steven Best [name]:[id number] DOB: [birth year]  PCP: Shirline Frees, MD  Admit date: 05/10/2021 Discharge date: 05/12/2021  Time spent: 30 mins  Recommendations for Outpatient Follow-up:  PCP in 1 week   Discharge Diagnoses:  Active Hospital Problems   Diagnosis Date Noted   Acute encephalopathy 05/11/2021   Generalized weakness    Paroxysmal atrial fibrillation ([place]) 07/22/2020   End stage renal disease ([place]) 10/02/2013    Resolved Hospital Problems  No resolved problems to display.    Discharge Condition: Stable  Diet recommendation: Renal diet  Vitals:   05/12/21 0542 05/12/21 1048  BP: (!) 107/53 105/64  Pulse: 85 77  Resp: 19 18  Temp: 98.3 F (36.8 C) 98.2 F (36.8 C)  SpO2: 96% 100%    History of present illness:  Steven Best is a 55 y.o. male with history of ESRD on hemodialysis on Tuesdays Thursdays and Saturday A. fib, anemia, hyperlipidemia was brought to the ER because of increasing confusion, slurred speech and left-sided chest pain.  Patient also had a fall. In the ED, patient appeared mildly lethargic.  EKG showed A. fib rate, controlled and patient underwent scans including CT scan of the head and C-spine chest abdomen pelvis, which did not show anything acute.  MRI brain did not show anything acute.  On exam patient had some ecchymotic areas on the abdomen of which he was unaware. High sensitive troponin was 50, Cardiology was consulted. COVID test was negative.  INR was subtherapeutic at 1.7.  Acetaminophen levels and salicylate levels are negative.  Patient admitted for further management.    Today, pt denies any new complaints, mentation much improved, back to baseline. Denies any further chest pain, SOB, abdominal pain, N/V, fever/chills, speech much improved, no obvious focal neurologic deficit. Ambulated with PT/OT with no issues. Discussed discharge plans with wife, verbalized understanding. Stable to d/c  home.    Hospital Course:  Principal Problem:   Acute encephalopathy Active Problems:   End stage renal disease (HCC)   Paroxysmal atrial fibrillation (HCC)   Generalized weakness   Possible acute metabolic encephalopathy likely 2/2 polypharmacy Currently afebrile, with no leukocytosis Likely polypharmacy (on gabapentin which was recently increased to 900 mg daily, hydrocodone, Klonopin recently increased to 2 mg daily, Flexeril) in the setting of ESRD MRI brain unremarkable for any acute CVA PT/OT- no further recs Discontinue gabapentin and start low dose Requip for restless leg syd, reduce klonipin to 1 mg daily from 2 mg, continue flexeril and Norco prn Follow up with PCP   Atypical left-sided chest pain Troponin with flat trend 50-->51-->48 EKG showed A. fib BNP 453 Echo with EF of 55-60%, no RWMA Cardiology consulted, recommend medical management   ESRD HD as per nephrology, T/T/S   Anemia of chronic kidney disease Hemoglobin stable around baseline   Chronic A. Fib Currently rate controlled Continue amiodarone, Coumadin  RLS Start low dose Requip, adjust as needed Discontinue gabapentin    Obesity Lifestyle modification advised      Estimated body mass index is 40.79 kg/m as calculated from the following:   Height as of this encounter: 6\' 2"  (1.88 m).   Weight as of this encounter: 144.1 kg.    Procedures: None  Consultations: Cardiology Nephrology   Discharge Exam: BP 105/64 (BP Location: Right Arm)   Pulse 77   Temp 98.2 F (36.8 C) (Oral)   Resp 18   Ht 6\' 2"  (1.88 m)   Wt (!) 144.1 kg  SpO2 100%   BMI 40.79 kg/m   General: NAD  Cardiovascular: S1, S2 present Respiratory: CTAB Abdomen: Soft, nontender, nondistended, bowel sounds present, noted bruising on abdomen Musculoskeletal: 2+ bilateral pedal edema noted Skin: Noted multiple bruising  Psychiatry: Normal mood     Discharge Instructions You were cared for by a  hospitalist during your hospital stay. If you have any questions about your discharge medications or the care you received while you were in the hospital after you are discharged, you can call the unit and asked to speak with the hospitalist on call if the hospitalist that took care of you is not available. Once you are discharged, your primary care physician will handle any further medical issues. Please note that NO REFILLS for any discharge medications will be authorized once you are discharged, as it is imperative that you return to your primary care physician (or establish a relationship with a primary care physician if you do not have one) for your aftercare needs so that they can reassess your need for medications and monitor your lab values.  Discharge Instructions     Diet - low sodium heart healthy   Complete by: As directed    Increase activity slowly   Complete by: As directed       Allergies as of 05/12/2021       Reactions   Hydroxyzine Hcl    Other reaction(s): confusion   Trazodone Hcl    Other reaction(s): dizziness   Gemfibrozil Rash        Medication List     STOP taking these medications    gabapentin 300 MG capsule Commonly known as: NEURONTIN       TAKE these medications    acetaminophen 500 MG tablet Commonly known as: TYLENOL Take 500 mg by mouth every 6 (six) hours as needed for mild pain or headache.   amiodarone 200 MG tablet Commonly known as: PACERONE Take 200 mg by mouth daily.   aspirin-sod bicarb-citric acid 325 MG Tbef tablet Commonly known as: ALKA-SELTZER Take 650 mg by mouth every 6 (six) hours as needed (indigestion).   Auryxia 1 GM 210 MG(Fe) tablet Generic drug: ferric citrate Take 840 mg by mouth See admin instructions. Take 4 tablets (840 mg) by mouth with each meals & snacks   cinacalcet 90 MG tablet Commonly known as: SENSIPAR Take 180 mg by mouth daily.   clonazePAM 2 MG tablet Commonly known as: KLONOPIN Take 0.5  tablets (1 mg total) by mouth daily. What changed:  how much to take Another medication with the same name was removed. Continue taking this medication, and follow the directions you see here.   cyanocobalamin 100 MCG tablet Take 1 tablet (100 mcg total) by mouth daily.   cyclobenzaprine 10 MG tablet Commonly known as: FLEXERIL Take 10 mg by mouth 3 (three) times daily as needed for muscle spasms.   escitalopram 5 MG tablet Commonly known as: LEXAPRO Take 5 mg by mouth daily.   ethyl chloride spray Apply 1 application topically daily as needed (port access).   HYDROcodone-acetaminophen 10-325 MG tablet Commonly known as: NORCO Take 1 tablet by mouth every 6 (six) hours as needed for severe pain.   hydroxypropyl methylcellulose / hypromellose 2.5 % ophthalmic solution Commonly known as: ISOPTO TEARS / GONIOVISC Place 1 drop into both eyes daily as needed for dry eyes.   lidocaine-prilocaine cream Commonly known as: EMLA Apply 1 application topically See admin instructions. Apply a small amount to skin prior  to dialysis access 1/2-1 hr prior to each dialysis treatment.   MIRCERA IJ Mircera   multivitamin with minerals Tabs tablet Take 1 tablet by mouth daily.   rOPINIRole 0.25 MG tablet Commonly known as: Requip Take 1 tablet (0.25 mg total) by mouth at bedtime.   rosuvastatin 5 MG tablet Commonly known as: CRESTOR Take 5 mg by mouth at bedtime.   warfarin 5 MG tablet Commonly known as: COUMADIN Take as directed. If you are unsure how to take this medication, talk to your nurse or doctor. Original instructions: Take 1 tablet (5 mg total) by mouth daily. Or as directed by Anticoagulation clinic What changed: additional instructions       Allergies  Allergen Reactions   Hydroxyzine Hcl     Other reaction(s): confusion   Trazodone Hcl     Other reaction(s): dizziness   Gemfibrozil Rash    Follow-up Information     Shirline Frees, MD. Schedule an appointment  as soon as possible for a visit in 1 week(s).   Specialty: Family Medicine Contact information: [place] [address] [phone]         Werner Lean, MD .   Specialty: Cardiology Contact information: [address] [place] [address] [phone]         Vickie Epley, MD .   Specialties: Cardiology, Radiology Contact information: [place]  [id number] [phone]                  The results of significant diagnostics from this hospitalization (including imaging, microbiology, ancillary and laboratory) are listed below for reference.    Significant Diagnostic Studies: DG Chest 2 View  Result Date: 05/10/2021 CLINICAL DATA:  Chest pain EXAM: CHEST - 2 VIEW COMPARISON:  October 13, 2020 FINDINGS: There is cardiomegaly with pulmonary vascularity within normal limits. No edema or airspace opacity. No adenopathy. No bone lesions. IMPRESSION: Cardiomegaly.  No edema or airspace opacity. Electronically Signed   By: Lowella Grip III M.D.   On: 05/10/2021 16:35   CT Head Wo Contrast  Result Date: 05/10/2021 CLINICAL DATA:  Fall and hit head Coumadin EXAM: CT HEAD WITHOUT CONTRAST CT CERVICAL SPINE WITHOUT CONTRAST TECHNIQUE: Multidetector CT imaging of the head and cervical spine was performed following the standard protocol without intravenous contrast. Multiplanar CT image reconstructions of the cervical spine were also generated. COMPARISON:  CT brain 10/13/2020, CT brain and cervical spine 07/17/2018, MRI 10/14/2020 FINDINGS: CT HEAD FINDINGS Brain: No acute territorial infarction, hemorrhage, or intracranial mass. Mild atrophy. Nonenlarged ventricles. Vascular: No hyperdense vessels.  No unexpected calcification Skull: Normal. Negative for fracture or focal lesion. Sinuses/Orbits: No acute finding. Other: None CT CERVICAL SPINE FINDINGS Alignment: There is motion degradation.  Straightening of the cervical spine. Note that the patient was images to mid T1. Skull base and vertebrae: No acute fracture. No primary bone lesion or focal pathologic process. Soft tissues and spinal canal: No prevertebral fluid or swelling. No visible canal hematoma. Disc levels: Moderate disk space narrowing atC4/C5, C5/C6, and C6/C7. Irregular facet degenerative changes on the right at C2/C3 Upper chest: Negative. Other: None IMPRESSION: 1. No CT evidence for acute intracranial abnormality. Mild atrophy. 2. Limited CT cervical spine CT due to motion and incomplete inclusion of T1. No acute osseous abnormality within the visible portions of the cervical spine. Electronically Signed   By: Donavan Foil M.D.   On: 05/10/2021 17:40  CT Cervical Spine Wo Contrast  Result Date: 05/10/2021 CLINICAL DATA:  Fall and hit head Coumadin EXAM: CT HEAD WITHOUT CONTRAST CT CERVICAL SPINE WITHOUT CONTRAST TECHNIQUE: Multidetector CT imaging of the head and cervical spine was performed following the standard protocol without intravenous contrast. Multiplanar CT image reconstructions of the cervical spine were also generated. COMPARISON:  CT brain 10/13/2020, CT brain and cervical spine 07/17/2018, MRI 10/14/2020 FINDINGS: CT HEAD FINDINGS Brain: No acute territorial infarction, hemorrhage, or intracranial mass. Mild atrophy. Nonenlarged ventricles. Vascular: No hyperdense vessels.  No unexpected calcification Skull: Normal. Negative for fracture or focal lesion. Sinuses/Orbits: No acute finding. Other: None CT CERVICAL SPINE FINDINGS Alignment: There is motion degradation. Straightening of the cervical spine. Note that the patient was images to mid T1. Skull base and vertebrae: No acute fracture. No primary bone lesion or focal pathologic process. Soft tissues and spinal canal: No prevertebral fluid or swelling. No visible canal hematoma. Disc levels: Moderate disk space narrowing atC4/C5, C5/C6, and C6/C7. Irregular facet  degenerative changes on the right at C2/C3 Upper chest: Negative. Other: None IMPRESSION: 1. No CT evidence for acute intracranial abnormality. Mild atrophy. 2. Limited CT cervical spine CT due to motion and incomplete inclusion of T1. No acute osseous abnormality within the visible portions of the cervical spine. Electronically Signed   By: Donavan Foil M.D.   On: 05/10/2021 17:40   MR BRAIN WO CONTRAST  Result Date: 05/11/2021 CLINICAL DATA:  Head trauma and altered mental status. EXAM: MRI HEAD WITHOUT CONTRAST TECHNIQUE: Multiplanar, multiecho pulse sequences of the brain and surrounding structures were obtained without intravenous contrast. COMPARISON:  None. FINDINGS: Brain: No acute infarct, mass effect or extra-axial collection. No acute or chronic hemorrhage. There is multifocal hyperintense T2-weighted signal within the white matter. Parenchymal volume and CSF spaces are normal. The midline structures are normal. Vascular: Major flow voids are preserved. Skull and upper cervical spine: Normal calvarium and skull base. Visualized upper cervical spine and soft tissues are normal. Sinuses/Orbits:No paranasal sinus fluid levels or advanced mucosal thickening. No mastoid or middle ear effusion. Normal orbits. IMPRESSION: 1. No acute intracranial abnormality. 2. Multifocal hyperintense T2-weighted signal within the white matter, most consistent with chronic small vessel ischemic disease. Electronically Signed   By: Ulyses Jarred M.D.   On: 05/11/2021 01:07   CT CHEST ABDOMEN PELVIS W CONTRAST  Addendum Date: 05/10/2021   ADDENDUM REPORT: 05/10/2021 22:12 ADDENDUM: Additional impressions. IMPRESSION: Small amount of free fluid adjacent to the liver, nonspecific. No acute cardiopulmonary disease. Electronically Signed   By: Rolm Baptise M.D.   On: 05/10/2021 22:12   Result Date: 05/10/2021 CLINICAL DATA:  Left chest pain, fall EXAM: CT CHEST, ABDOMEN, AND PELVIS WITH CONTRAST TECHNIQUE: Multidetector CT  imaging of the chest, abdomen and pelvis was performed following the standard protocol during bolus administration of intravenous contrast. CONTRAST:  174mL OMNIPAQUE IOHEXOL 300 MG/ML  SOLN COMPARISON:  07/17/2018 FINDINGS: CT CHEST FINDINGS Cardiovascular: Diffusely calcified coronary arteries. Scattered aortic calcifications. No aneurysm. Heart is borderline in size. Mediastinum/Nodes: No mediastinal, hilar, or axillary adenopathy. Trachea and esophagus are unremarkable. Thyroid unremarkable. Lungs/Pleura: Lungs are clear. No focal airspace opacities or suspicious nodules. No effusions. Musculoskeletal: Chest wall soft tissues are unremarkable. No acute bony abnormality. CT ABDOMEN PELVIS FINDINGS Hepatobiliary: Prior cholecystectomy. Calcified cystic structure within the gallbladder fossa is similar to prior study. No biliary ductal dilatation. No focal hepatic abnormality. Pancreas: No focal abnormality or ductal dilatation. Spleen: No focal abnormality.  Normal size. Adrenals/Urinary  Tract: 2.4 cm right adrenal nodule compared with 2.6 cm previously. Diffuse renal atrophy compatible with in stage renal disease. No hydronephrosis. Urinary bladder unremarkable. Stomach/Bowel: Normal appendix. Stomach, large and small bowel grossly unremarkable. Vascular/Lymphatic: Heavily calcified aorta and iliac vessels. No evidence of aneurysm or adenopathy. Reproductive: No visible focal abnormality. Other: Small amount of perihepatic ascites. Musculoskeletal: Mild compression fractures through the superior endplates at L2 and L4, progressed since prior study. IMPRESSION: Renal atrophy compatible with in stage renal disease. Diffuse coronary artery disease, aortic atherosclerosis. Rim calcified structure in the gallbladder fossa adjacent to cholecystectomy clips, likely postsurgical collection, stable since prior study. No acute findings in the abdomen or pelvis. Electronically Signed: By: Rolm Baptise M.D. On: 05/10/2021  22:06   ECHOCARDIOGRAM COMPLETE  Result Date: 05/11/2021    ECHOCARDIOGRAM REPORT   Patient Name:   JOBE MUTCH Date of Exam: 05/11/2021 Medical Rec #:  [id number]           Height:       74.0 in Accession #:    [id number]          Weight:       308.0 lb Date of Birth:  06/28/1966           BSA:          2.611 m Patient Age:    43 years            BP:           115/76 mmHg Patient Gender: M                   HR:           75 bpm. Exam Location:  Inpatient Procedure: 2D Echo, Color Doppler and Cardiac Doppler Indications:    R07.9* Chest pain, unspecified  History:        Patient has prior history of Echocardiogram examinations, most                 recent 08/20/2020. Arrythmias:Atrial Fibrillation; Risk                 Factors:Hypertension.  Sonographer:    Raquel Sarna Senior RDCS Referring Phys: [place]  1. Left ventricular ejection fraction, by estimation, is 55 to 60%. The left ventricle has normal function. The left ventricle has no regional wall motion abnormalities. The left ventricular internal cavity size was severely dilated. Left ventricular diastolic parameters are indeterminate. Elevated left atrial pressure.  2. Right ventricular systolic function is normal. The right ventricular size is normal. There is moderately elevated pulmonary artery systolic pressure.  3. Left atrial size was mildly dilated.  4. The mitral valve is abnormal. Trivial mitral valve regurgitation. Mild mitral stenosis. Severe mitral annular calcification.  5. The aortic valve is tricuspid. Aortic valve regurgitation is not visualized. Mild to moderate aortic valve stenosis.  6. The inferior vena cava is dilated in size with <50% respiratory variability, suggesting right atrial pressure of 15 mmHg. FINDINGS  Left Ventricle: Left ventricular ejection fraction, by estimation, is 55 to 60%. The left ventricle has normal function. The left ventricle has no regional wall motion abnormalities. The left  ventricular internal cavity size was severely dilated. There is no left ventricular hypertrophy. Left ventricular diastolic parameters are indeterminate. Elevated left atrial pressure. Right Ventricle: The right ventricular size is normal.Right ventricular systolic function is normal. There is moderately elevated pulmonary artery systolic pressure. The tricuspid regurgitant velocity is 2.96 m/s,  and with an assumed right atrial pressure of 15 mmHg, the estimated right ventricular systolic pressure is 41.3 mmHg. Left Atrium: Left atrial size was mildly dilated. Right Atrium: Right atrial size was normal in size. Pericardium: There is no evidence of pericardial effusion. Mitral Valve: The mitral valve is abnormal. Severe mitral annular calcification. Trivial mitral valve regurgitation. Mild mitral valve stenosis. MV peak gradient, 16.3 mmHg. The mean mitral valve gradient is 6.2 mmHg. Tricuspid Valve: The tricuspid valve is normal in structure. Tricuspid valve regurgitation is mild . No evidence of tricuspid stenosis. Aortic Valve: The aortic valve is tricuspid. Aortic valve regurgitation is not visualized. Mild to moderate aortic stenosis is present. Aortic valve mean gradient measures 19.0 mmHg. Aortic valve peak gradient measures 36.7 mmHg. Aortic valve area, by VTI measures 1.50 cm. Pulmonic Valve: The pulmonic valve was normal in structure. Pulmonic valve regurgitation is not visualized. No evidence of pulmonic stenosis. Aorta: The aortic root is normal in size and structure. Venous: The inferior vena cava is dilated in size with less than 50% respiratory variability, suggesting right atrial pressure of 15 mmHg.  LEFT VENTRICLE PLAX 2D LVIDd:         7.00 cm  Diastology LVIDs:         5.50 cm  LV e' medial:    5.11 cm/s LV PW:         0.90 cm  LV E/e' medial:  33.1 LV IVS:        0.80 cm  LV e' lateral:   6.53 cm/s LVOT diam:     2.50 cm  LV E/e' lateral: 25.9 LV SV:         99 LV SV Index:   38 LVOT Area:      4.91 cm  RIGHT VENTRICLE RV S prime:     8.16 cm/s TAPSE (M-mode): 1.5 cm LEFT ATRIUM              Index       RIGHT ATRIUM           Index LA diam:        5.50 cm  2.11 cm/m  RA Area:     20.50 cm LA Vol (A2C):   87.6 ml  33.55 ml/m RA Volume:   58.80 ml  22.52 ml/m LA Vol (A4C):   98.8 ml  37.84 ml/m LA Biplane Vol: 100.0 ml 38.30 ml/m  AORTIC VALVE AV Area (Vmax):    1.59 cm AV Area (Vmean):   1.77 cm AV Area (VTI):     1.50 cm AV Vmax:           303.00 cm/s AV Vmean:          208.000 cm/s AV VTI:            0.660 m AV Peak Grad:      36.7 mmHg AV Mean Grad:      19.0 mmHg LVOT Vmax:         98.30 cm/s LVOT Vmean:        75.000 cm/s LVOT VTI:          0.202 m LVOT/AV VTI ratio: 0.31  AORTA Ao Root diam: 3.30 cm Ao Asc diam:  3.50 cm MITRAL VALVE                 TRICUSPID VALVE MV Area (PHT): 2.95 cm      TR Peak grad:   35.0 mmHg MV Area VTI:   2.19 cm  TR Vmax:        296.00 cm/s MV Peak grad:  16.3 mmHg MV Mean grad:  6.2 mmHg      SHUNTS MV Vmax:       2.02 m/s      Systemic VTI:  0.20 m MV Vmean:      111.0 cm/s    Systemic Diam: 2.50 cm MV Decel Time: 257 msec MR Peak grad:    85.4 mmHg MR Mean grad:    56.0 mmHg MR Vmax:         462.00 cm/s MR Vmean:        359.0 cm/s MR PISA:         1.57 cm MR PISA Eff ROA: 13 mm MR PISA Radius:  0.50 cm MV E velocity: 169.00 cm/s MV A velocity: 69.60 cm/s MV E/A ratio:  2.43 Kirk Ruths MD Electronically signed by Kirk Ruths MD Signature Date/Time: 05/11/2021/2:06:09 PM    Final     Microbiology: Recent Results (from the past 240 hour(s))  Resp Panel by RT-PCR (Flu A&B, Covid) Nasopharyngeal Swab     Status: None   Collection Time: 05/11/21  2:24 AM   Specimen: Nasopharyngeal Swab; Nasopharyngeal(NP) swabs in vial transport medium  Result Value Ref Range Status   SARS Coronavirus 2 by RT PCR NEGATIVE NEGATIVE Final    Comment: (NOTE) SARS-CoV-2 target nucleic acids are NOT DETECTED.  The SARS-CoV-2 RNA is generally detectable in upper  respiratory specimens during the acute phase of infection. The lowest concentration of SARS-CoV-2 viral copies this assay can detect is 138 copies/mL. A negative result does not preclude SARS-Cov-2 infection and should not be used as the sole basis for treatment or other patient management decisions. A negative result may occur with  improper specimen collection/handling, submission of specimen other than nasopharyngeal swab, presence of viral mutation(s) within the areas targeted by this assay, and inadequate number of viral copies(<138 copies/mL). A negative result must be combined with clinical observations, patient history, and epidemiological information. The expected result is Negative.  Fact Sheet for Patients:  EntrepreneurPulse.com.au  Fact Sheet for Healthcare Providers:  IncredibleEmployment.be  This test is no t yet approved or cleared by the [place] FDA and  has been authorized for detection and/or diagnosis of SARS-CoV-2 by FDA under an Emergency Use Authorization (EUA). This EUA will remain  in effect (meaning this test can be used) for the duration of the COVID-19 declaration under Section 564(b)(1) of the Act, 21 U.S.C.section 360bbb-3(b)(1), unless the authorization is terminated  or revoked sooner.       Influenza A by PCR NEGATIVE NEGATIVE Final   Influenza B by PCR NEGATIVE NEGATIVE Final    Comment: (NOTE) The Xpert Xpress SARS-CoV-2/FLU/RSV plus assay is intended as an aid in the diagnosis of influenza from Nasopharyngeal swab specimens and should not be used as a sole basis for treatment. Nasal washings and aspirates are unacceptable for Xpert Xpress SARS-CoV-2/FLU/RSV testing.  Fact Sheet for Patients: EntrepreneurPulse.com.au  Fact Sheet for Healthcare Providers: IncredibleEmployment.be  This test is not yet approved or cleared by the [place] FDA and has been  authorized for detection and/or diagnosis of SARS-CoV-2 by FDA under an Emergency Use Authorization (EUA). This EUA will remain in effect (meaning this test can be used) for the duration of the COVID-19 declaration under Section 564(b)(1) of the Act, 21 U.S.C. section 360bbb-3(b)(1), unless the authorization is terminated or revoked.  Performed at [facility], [place] [address]  [place], [place] [address]   MRSA Next Gen by PCR, Nasal     Status: Abnormal   Collection Time: 05/11/21 10:57 PM   Specimen: Nasal Mucosa; Nasal Swab  Result Value Ref Range Status   MRSA by PCR Next Gen DETECTED (A) NOT DETECTED Final    Comment: RESULT CALLED TO, READ BACK BY AND VERIFIED WITH: Barnabas Harries RN, AT 0106 05/12/21 DV (NOTE) The GeneXpert MRSA Assay (FDA approved for NASAL specimens only), is one component of a comprehensive MRSA colonization surveillance program. It is not intended to diagnose MRSA infection nor to guide or monitor treatment for MRSA infections. Test performance is not FDA approved in patients less than 30 years old. Performed at [facility], [place] [address], [place], [place] [address]      Labs: Basic Metabolic Panel: Recent Labs  Lab 05/10/21 1800 05/12/21 0615  NA 131* 135  K 4.9 3.6  CL 90* 92*  CO2 24 28  GLUCOSE 92 86  BUN 51* 28*  CREATININE 8.88* 5.47*  CALCIUM 6.5* 7.3*  MG 2.3  --   PHOS  --  4.3   Liver Function Tests: Recent Labs  Lab 05/10/21 1800 05/12/21 0615  AST 47*  --   ALT 25  --   ALKPHOS 55  --   BILITOT 0.9  --   PROT 7.3  --   ALBUMIN 3.7 3.4*   No results for input(s): LIPASE, AMYLASE in the last 168 hours. Recent Labs  Lab 05/10/21 1800  AMMONIA 33   CBC: Recent Labs  Lab 05/10/21 1610 05/10/21 2307 05/12/21 0615  WBC 7.3  --  6.7  NEUTROABS 5.6  --  5.5  HGB 8.1* 7.8* 8.3*  HCT 26.5* 26.4* 25.9*  MCV 103.9*  --  99.6  PLT 162  --  182   Cardiac Enzymes: No results for input(s): CKTOTAL, CKMB,  CKMBINDEX, TROPONINI in the last 168 hours. BNP: BNP (last 3 results) Recent Labs    05/10/21 1610  BNP 453.0*    ProBNP (last 3 results) No results for input(s): PROBNP in the last 8760 hours.  CBG: No results for input(s): GLUCAP in the last 168 hours.     Signed:  Alma Friendly, MD [facility] 05/12/2021, 3:01 PM

## 2021-05-12 NOTE — Progress Notes (Signed)
DISCHARGE NOTE HOME Steven Best to be discharged Home per MD order. Discussed prescriptions and follow up appointments with the patient. Prescriptions given to patient; medication list explained in detail. Patient verbalized understanding.  Skin clean, dry and intact without evidence of skin break down, no evidence of skin tears noted. IV catheter discontinued intact. Site without signs and symptoms of complications. Dressing and pressure applied. Pt denies pain at the site currently. No complaints noted.  Patient free of lines, drains, and wounds.   An After Visit Summary (AVS) was printed and given to the patient. Patient escorted via wheelchair, and discharged home via private auto.  Vira Agar, RN
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
## 2021-05-12 NOTE — Progress Notes (Signed)
ANTICOAGULATION CONSULT NOTE - Follow Up Consult  Pharmacy Consult for Warfarin Indication: atrial fibrillation  Allergies  Allergen Reactions   Hydroxyzine Hcl     Other reaction(s): confusion   Trazodone Hcl     Other reaction(s): dizziness   Gemfibrozil Rash    Patient Measurements: Height: 6\' 2"  (188 cm) Weight: (!) 144.1 kg (317 lb 10.9 oz) IBW/kg (Calculated) : 82.2  Vital Signs: Temp: 98.3 F (36.8 C) (06/15 0542) Temp Source: Oral (06/15 0542) BP: 107/53 (06/15 0542) Pulse Rate: 85 (06/15 0542)  Labs: Recent Labs    05/10/21 1610 05/10/21 1800 05/10/21 2307 05/11/21 0750 05/11/21 1017 05/12/21 0615  HGB 8.1*  --  7.8*  --   --  8.3*  HCT 26.5*  --  26.4*  --   --  25.9*  PLT 162  --   --   --   --  182  LABPROT  --  19.8*  --   --   --  25.7*  INR  --  1.7*  --   --   --  2.3*  CREATININE  --  8.88*  --   --   --  5.47*  TROPONINIHS  --  50*  --  51* 48*  --    ESRD  Assessment:  55 yr old male continued on Warfarin as PTA for atrial fibrillation.      INR subtherapeutic (1.7) on 6/14 and warfarin 7.5 mg x 1 given.  INR is now therapeutic (2.3).  On amiodarone 200 mg daily as PTA.    PTA regimen reported: 5 mg daily  - clinic note 04/27/21 indicates 5 mg daily except 7.5 mg on Thursdays. Outpatient INR 2.9 on 04/27/21.    Goal of Therapy:  INR 2-3 Monitor platelets by anticoagulation protocol: Yes   Plan:   Resume Warfarin 5 mg daily at 4pm.  Daily PT/INR for now.  Arty Baumgartner, RPh 05/12/2021,9:56 AM

## 2021-05-12 NOTE — Plan of Care (Signed)
  Problem: Education: Goal: Knowledge of General Education information will improve Description: Including pain rating scale, medication(s)/side effects and non-pharmacologic comfort measures Outcome: Completed/Met   Problem: Health Behavior/Discharge Planning: Goal: Ability to manage health-related needs will improve Outcome: Completed/Met   Problem: Clinical Measurements: Goal: Ability to maintain clinical measurements within normal limits will improve Outcome: Completed/Met Goal: Will remain free from infection Outcome: Completed/Met Goal: Diagnostic test results will improve Outcome: Completed/Met Goal: Respiratory complications will improve Outcome: Completed/Met Goal: Cardiovascular complication will be avoided Outcome: Completed/Met   Problem: Activity: Goal: Risk for activity intolerance will decrease Outcome: Completed/Met   Problem: Nutrition: Goal: Adequate nutrition will be maintained Outcome: Completed/Met   Problem: Coping: Goal: Level of anxiety will decrease Outcome: Completed/Met   Problem: Elimination: Goal: Will not experience complications related to bowel motility Outcome: Completed/Met Goal: Will not experience complications related to urinary retention Outcome: Completed/Met   Problem: Pain Managment: Goal: General experience of comfort will improve Outcome: Completed/Met   Problem: Safety: Goal: Ability to remain free from injury will improve Outcome: Completed/Met   Problem: Skin Integrity: Goal: Risk for impaired skin integrity will decrease Outcome: Completed/Met   Problem: Education: Goal: Knowledge of disease and its progression will improve Outcome: Completed/Met Goal: Individualized Educational Video(s) Outcome: Completed/Met   Problem: Fluid Volume: Goal: Compliance with measures to maintain balanced fluid volume will improve Outcome: Completed/Met   Problem: Health Behavior/Discharge Planning: Goal: Ability to manage  health-related needs will improve Outcome: Completed/Met   Problem: Nutritional: Goal: Ability to make healthy dietary choices will improve Outcome: Completed/Met   Problem: Clinical Measurements: Goal: Complications related to the disease process, condition or treatment will be avoided or minimized Outcome: Completed/Met

## 2021-05-12 NOTE — Evaluation (Signed)
Physical Therapy Evaluation Patient Details Name: Steven Best MRN: [id number] DOB: 1966/09/05 Today's Date: 05/12/2021   History of Present Illness  Pt adm 6/14 with confusion, falls, slurred speech, and chest pain. CT and MRI of head both negative for acute changes. Pt with possible acute metabolic encephalopathy. Cardiology consulted without acute findings and signed off. PMH - ESRD on HD, afib, anemia  Clinical Impression  Pt doing fairly well with mobility and at or very close to baseline. No further PT needed.  Ready for dc from PT standpoint.     Follow Up Recommendations No PT follow up    Equipment Recommendations  None recommended by PT    Recommendations for Other Services       Precautions / Restrictions        Mobility  Bed Mobility               General bed mobility comments: Pt sitting EOB    Transfers Overall transfer level: Modified independent Equipment used: None Transfers: Sit to/from Stand Sit to Stand: Modified independent (Device/Increase time)            Ambulation/Gait Ambulation/Gait assistance: Supervision;Modified independent (Device/Increase time) Gait Distance (Feet): 200 Feet Assistive device: None Gait Pattern/deviations: Step-through pattern;Decreased stride length;Wide base of support Gait velocity: decr Gait velocity interpretation: 1.31 - 2.62 ft/sec, indicative of limited community ambulator General Gait Details: In room pt modified independent. In hallway pt more guarded and supervision for safety. One minor loss of balance in hallway but pt able to recover without assistance  Stairs            Wheelchair Mobility    Modified Rankin (Stroke Patients Only)       Balance Overall balance assessment: Mild deficits observed, not formally tested                                           Pertinent Vitals/Pain Pain Assessment: No/denies pain    Home Living Family/patient expects to be  discharged to:: Private residence Living Arrangements: Spouse/significant other Available Help at Discharge: Family;Available 24 hours/day Type of Home: House Home Access: Stairs to enter Entrance Stairs-Rails: Psychiatric nurse of Steps: 5 Home Layout: One level Home Equipment: None      Prior Function Level of Independence: Independent               Hand Dominance        Extremity/Trunk Assessment   Upper Extremity Assessment Upper Extremity Assessment: Defer to OT evaluation    Lower Extremity Assessment Lower Extremity Assessment: Overall WFL for tasks assessed       Communication   Communication: No difficulties  Cognition Arousal/Alertness: Awake/alert Behavior During Therapy: WFL for tasks assessed/performed Overall Cognitive Status: No family/caregiver present to determine baseline cognitive functioning                                        General Comments      Exercises     Assessment/Plan    PT Assessment Patent does not need any further PT services  PT Problem List         PT Treatment Interventions      PT Goals (Current goals can be found in the Care Plan section)  Acute Rehab PT Goals  PT Goal Formulation: All assessment and education complete, DC therapy    Frequency     Barriers to discharge        Co-evaluation               AM-PAC PT "6 Clicks" Mobility  Outcome Measure Help needed turning from your back to your side while in a flat bed without using bedrails?: None Help needed moving from lying on your back to sitting on the side of a flat bed without using bedrails?: None Help needed moving to and from a bed to a chair (including a wheelchair)?: None Help needed standing up from a chair using your arms (e.g., wheelchair or bedside chair)?: None Help needed to walk in hospital room?: None Help needed climbing 3-5 steps with a railing? : A Little 6 Click Score: 23    End of  Session   Activity Tolerance: Patient tolerated treatment well Patient left: in bed;with bed alarm set;with call bell/phone within reach (sitting EOB)   PT Visit Diagnosis: Other abnormalities of gait and mobility (R26.89)    Time: 1004-1020 PT Time Calculation (min) (ACUTE ONLY): 16 min   Charges:   PT Evaluation $PT Eval Low Complexity: 1 Low          [place] Pager [phone] Office [place] 05/12/2021, 10:48 AM

## 2021-05-13 ENCOUNTER — Telehealth: Payer: Self-pay | Admitting: Nurse Practitioner

## 2021-05-13 NOTE — Telephone Encounter (Signed)
Transition of care contact from inpatient facility  Date of Discharge: 05/12/2021 Date of Contact:05/13/2021 Method of contact: Phone  Attempted to contact patient to discuss transition of care from inpatient admission. Patient did not answer the phone.Unable to leave message on the patient's voicemail due to mailbox being full.

## 2021-05-14 ENCOUNTER — Telehealth (HOSPITAL_COMMUNITY): Payer: Self-pay | Admitting: Nephrology

## 2021-05-14 NOTE — Telephone Encounter (Signed)
Transition of care contact from inpatient facility  Date of discharge: 05/12/2021 Date of contact: 05/14/2021 Method: Phone Spoke to: Patient  Patient contacted to discuss transition of care from recent inpatient hospitalization. Patient was admitted to [facility] from 6/13 - 05/12/2021 with discharge diagnosis of AMS, polypharmacy.  Medication changes were reviewed - reducing sedating meds.  Patient will follow up with his/her outpatient HD unit on: tomorrow - plans to go.  No other needs at this time.  Veneta Penton, PA-C [employer] Pager [phone]

## 2021-05-15 DIAGNOSIS — N186 End stage renal disease: Secondary | ICD-10-CM | POA: Diagnosis not present

## 2021-05-15 DIAGNOSIS — N2581 Secondary hyperparathyroidism of renal origin: Secondary | ICD-10-CM | POA: Diagnosis not present

## 2021-05-15 DIAGNOSIS — D509 Iron deficiency anemia, unspecified: Secondary | ICD-10-CM | POA: Diagnosis not present

## 2021-05-15 DIAGNOSIS — Z992 Dependence on renal dialysis: Secondary | ICD-10-CM | POA: Diagnosis not present

## 2021-05-19 DIAGNOSIS — N2581 Secondary hyperparathyroidism of renal origin: Secondary | ICD-10-CM | POA: Diagnosis not present

## 2021-05-19 DIAGNOSIS — Z992 Dependence on renal dialysis: Secondary | ICD-10-CM | POA: Diagnosis not present

## 2021-05-19 DIAGNOSIS — N186 End stage renal disease: Secondary | ICD-10-CM | POA: Diagnosis not present

## 2021-05-19 DIAGNOSIS — D509 Iron deficiency anemia, unspecified: Secondary | ICD-10-CM | POA: Diagnosis not present

## 2021-05-20 DIAGNOSIS — N2581 Secondary hyperparathyroidism of renal origin: Secondary | ICD-10-CM | POA: Diagnosis not present

## 2021-05-20 DIAGNOSIS — Z992 Dependence on renal dialysis: Secondary | ICD-10-CM | POA: Diagnosis not present

## 2021-05-20 DIAGNOSIS — N186 End stage renal disease: Secondary | ICD-10-CM | POA: Diagnosis not present

## 2021-05-20 DIAGNOSIS — D509 Iron deficiency anemia, unspecified: Secondary | ICD-10-CM | POA: Diagnosis not present

## 2021-05-22 DIAGNOSIS — N186 End stage renal disease: Secondary | ICD-10-CM | POA: Diagnosis not present

## 2021-05-22 DIAGNOSIS — D509 Iron deficiency anemia, unspecified: Secondary | ICD-10-CM | POA: Diagnosis not present

## 2021-05-22 DIAGNOSIS — Z992 Dependence on renal dialysis: Secondary | ICD-10-CM | POA: Diagnosis not present

## 2021-05-22 DIAGNOSIS — N2581 Secondary hyperparathyroidism of renal origin: Secondary | ICD-10-CM | POA: Diagnosis not present

## 2021-05-24 DIAGNOSIS — Z992 Dependence on renal dialysis: Secondary | ICD-10-CM | POA: Diagnosis not present

## 2021-05-24 DIAGNOSIS — I120 Hypertensive chronic kidney disease with stage 5 chronic kidney disease or end stage renal disease: Secondary | ICD-10-CM | POA: Diagnosis not present

## 2021-05-24 DIAGNOSIS — N186 End stage renal disease: Secondary | ICD-10-CM | POA: Diagnosis not present

## 2021-05-25 DIAGNOSIS — Z7901 Long term (current) use of anticoagulants: Secondary | ICD-10-CM | POA: Diagnosis not present

## 2021-05-25 DIAGNOSIS — N2581 Secondary hyperparathyroidism of renal origin: Secondary | ICD-10-CM | POA: Diagnosis not present

## 2021-05-25 DIAGNOSIS — N186 End stage renal disease: Secondary | ICD-10-CM | POA: Diagnosis not present

## 2021-05-25 DIAGNOSIS — D631 Anemia in chronic kidney disease: Secondary | ICD-10-CM | POA: Diagnosis not present

## 2021-05-25 DIAGNOSIS — D509 Iron deficiency anemia, unspecified: Secondary | ICD-10-CM | POA: Diagnosis not present

## 2021-05-25 DIAGNOSIS — Z992 Dependence on renal dialysis: Secondary | ICD-10-CM | POA: Diagnosis not present

## 2021-05-27 DIAGNOSIS — N186 End stage renal disease: Secondary | ICD-10-CM | POA: Diagnosis not present

## 2021-05-27 DIAGNOSIS — D509 Iron deficiency anemia, unspecified: Secondary | ICD-10-CM | POA: Diagnosis not present

## 2021-05-27 DIAGNOSIS — Z992 Dependence on renal dialysis: Secondary | ICD-10-CM | POA: Diagnosis not present

## 2021-05-27 DIAGNOSIS — Z7901 Long term (current) use of anticoagulants: Secondary | ICD-10-CM | POA: Diagnosis not present

## 2021-05-27 DIAGNOSIS — I129 Hypertensive chronic kidney disease with stage 1 through stage 4 chronic kidney disease, or unspecified chronic kidney disease: Secondary | ICD-10-CM | POA: Diagnosis not present

## 2021-05-27 DIAGNOSIS — N2581 Secondary hyperparathyroidism of renal origin: Secondary | ICD-10-CM | POA: Diagnosis not present

## 2021-05-27 DIAGNOSIS — D631 Anemia in chronic kidney disease: Secondary | ICD-10-CM | POA: Diagnosis not present

## 2021-05-29 DIAGNOSIS — N2581 Secondary hyperparathyroidism of renal origin: Secondary | ICD-10-CM | POA: Diagnosis not present

## 2021-05-29 DIAGNOSIS — Z23 Encounter for immunization: Secondary | ICD-10-CM | POA: Diagnosis not present

## 2021-05-29 DIAGNOSIS — D689 Coagulation defect, unspecified: Secondary | ICD-10-CM | POA: Diagnosis not present

## 2021-05-29 DIAGNOSIS — Z992 Dependence on renal dialysis: Secondary | ICD-10-CM | POA: Diagnosis not present

## 2021-05-29 DIAGNOSIS — D631 Anemia in chronic kidney disease: Secondary | ICD-10-CM | POA: Diagnosis not present

## 2021-05-29 DIAGNOSIS — D509 Iron deficiency anemia, unspecified: Secondary | ICD-10-CM | POA: Diagnosis not present

## 2021-05-29 DIAGNOSIS — Z7901 Long term (current) use of anticoagulants: Secondary | ICD-10-CM | POA: Diagnosis not present

## 2021-05-29 DIAGNOSIS — N186 End stage renal disease: Secondary | ICD-10-CM | POA: Diagnosis not present

## 2021-06-01 DIAGNOSIS — Z992 Dependence on renal dialysis: Secondary | ICD-10-CM | POA: Diagnosis not present

## 2021-06-01 DIAGNOSIS — D509 Iron deficiency anemia, unspecified: Secondary | ICD-10-CM | POA: Diagnosis not present

## 2021-06-01 DIAGNOSIS — Z7901 Long term (current) use of anticoagulants: Secondary | ICD-10-CM | POA: Diagnosis not present

## 2021-06-01 DIAGNOSIS — D689 Coagulation defect, unspecified: Secondary | ICD-10-CM | POA: Diagnosis not present

## 2021-06-01 DIAGNOSIS — Z23 Encounter for immunization: Secondary | ICD-10-CM | POA: Diagnosis not present

## 2021-06-01 DIAGNOSIS — N186 End stage renal disease: Secondary | ICD-10-CM | POA: Diagnosis not present

## 2021-06-01 DIAGNOSIS — N2581 Secondary hyperparathyroidism of renal origin: Secondary | ICD-10-CM | POA: Diagnosis not present

## 2021-06-01 DIAGNOSIS — D631 Anemia in chronic kidney disease: Secondary | ICD-10-CM | POA: Diagnosis not present

## 2021-06-03 DIAGNOSIS — Z23 Encounter for immunization: Secondary | ICD-10-CM | POA: Diagnosis not present

## 2021-06-03 DIAGNOSIS — Z7901 Long term (current) use of anticoagulants: Secondary | ICD-10-CM | POA: Diagnosis not present

## 2021-06-03 DIAGNOSIS — Z992 Dependence on renal dialysis: Secondary | ICD-10-CM | POA: Diagnosis not present

## 2021-06-03 DIAGNOSIS — D631 Anemia in chronic kidney disease: Secondary | ICD-10-CM | POA: Diagnosis not present

## 2021-06-03 DIAGNOSIS — N186 End stage renal disease: Secondary | ICD-10-CM | POA: Diagnosis not present

## 2021-06-03 DIAGNOSIS — D509 Iron deficiency anemia, unspecified: Secondary | ICD-10-CM | POA: Diagnosis not present

## 2021-06-03 DIAGNOSIS — D689 Coagulation defect, unspecified: Secondary | ICD-10-CM | POA: Diagnosis not present

## 2021-06-03 DIAGNOSIS — N2581 Secondary hyperparathyroidism of renal origin: Secondary | ICD-10-CM | POA: Diagnosis not present

## 2021-06-04 ENCOUNTER — Other Ambulatory Visit: Payer: Self-pay

## 2021-06-04 ENCOUNTER — Ambulatory Visit (INDEPENDENT_AMBULATORY_CARE_PROVIDER_SITE_OTHER): Payer: Medicare Other | Admitting: *Deleted

## 2021-06-04 DIAGNOSIS — Z8669 Personal history of other diseases of the nervous system and sense organs: Secondary | ICD-10-CM | POA: Diagnosis not present

## 2021-06-04 DIAGNOSIS — I4819 Other persistent atrial fibrillation: Secondary | ICD-10-CM

## 2021-06-04 DIAGNOSIS — I480 Paroxysmal atrial fibrillation: Secondary | ICD-10-CM | POA: Diagnosis not present

## 2021-06-04 DIAGNOSIS — G2581 Restless legs syndrome: Secondary | ICD-10-CM | POA: Diagnosis not present

## 2021-06-04 DIAGNOSIS — N186 End stage renal disease: Secondary | ICD-10-CM | POA: Diagnosis not present

## 2021-06-04 DIAGNOSIS — G894 Chronic pain syndrome: Secondary | ICD-10-CM | POA: Diagnosis not present

## 2021-06-04 DIAGNOSIS — Z5181 Encounter for therapeutic drug level monitoring: Secondary | ICD-10-CM | POA: Diagnosis not present

## 2021-06-04 LAB — POCT INR: INR: 1.2 — AB (ref 2.0–3.0)

## 2021-06-04 NOTE — Patient Instructions (Signed)
Description   Today and tomorrow take 1.5 tablets then continue warfarin dosage of 1 tablet by mouth (5mg ) every day exept Thursday take 1 and 1/2 tablets (7.5mg ).  Keep your green leafy vegetable intake consistent. Recheck INR in 1 week. Coumadin Clinic. [phone].

## 2021-06-05 DIAGNOSIS — D509 Iron deficiency anemia, unspecified: Secondary | ICD-10-CM | POA: Diagnosis not present

## 2021-06-05 DIAGNOSIS — D631 Anemia in chronic kidney disease: Secondary | ICD-10-CM | POA: Diagnosis not present

## 2021-06-05 DIAGNOSIS — Z7901 Long term (current) use of anticoagulants: Secondary | ICD-10-CM | POA: Diagnosis not present

## 2021-06-05 DIAGNOSIS — Z992 Dependence on renal dialysis: Secondary | ICD-10-CM | POA: Diagnosis not present

## 2021-06-05 DIAGNOSIS — N2581 Secondary hyperparathyroidism of renal origin: Secondary | ICD-10-CM | POA: Diagnosis not present

## 2021-06-05 DIAGNOSIS — N186 End stage renal disease: Secondary | ICD-10-CM | POA: Diagnosis not present

## 2021-06-05 DIAGNOSIS — Z23 Encounter for immunization: Secondary | ICD-10-CM | POA: Diagnosis not present

## 2021-06-05 DIAGNOSIS — D689 Coagulation defect, unspecified: Secondary | ICD-10-CM | POA: Diagnosis not present

## 2021-06-08 DIAGNOSIS — D509 Iron deficiency anemia, unspecified: Secondary | ICD-10-CM | POA: Diagnosis not present

## 2021-06-08 DIAGNOSIS — N2581 Secondary hyperparathyroidism of renal origin: Secondary | ICD-10-CM | POA: Diagnosis not present

## 2021-06-08 DIAGNOSIS — N186 End stage renal disease: Secondary | ICD-10-CM | POA: Diagnosis not present

## 2021-06-08 DIAGNOSIS — Z992 Dependence on renal dialysis: Secondary | ICD-10-CM | POA: Diagnosis not present

## 2021-06-08 DIAGNOSIS — Z7901 Long term (current) use of anticoagulants: Secondary | ICD-10-CM | POA: Diagnosis not present

## 2021-06-08 DIAGNOSIS — D631 Anemia in chronic kidney disease: Secondary | ICD-10-CM | POA: Diagnosis not present

## 2021-06-08 DIAGNOSIS — Z23 Encounter for immunization: Secondary | ICD-10-CM | POA: Diagnosis not present

## 2021-06-08 DIAGNOSIS — D689 Coagulation defect, unspecified: Secondary | ICD-10-CM | POA: Diagnosis not present

## 2021-06-10 ENCOUNTER — Encounter (HOSPITAL_COMMUNITY): Payer: Self-pay | Admitting: *Deleted

## 2021-06-10 ENCOUNTER — Encounter (HOSPITAL_COMMUNITY): Payer: Self-pay | Admitting: Internal Medicine

## 2021-06-10 ENCOUNTER — Other Ambulatory Visit: Payer: Self-pay

## 2021-06-10 ENCOUNTER — Encounter (HOSPITAL_COMMUNITY): Payer: Self-pay

## 2021-06-10 ENCOUNTER — Emergency Department (HOSPITAL_COMMUNITY): Payer: Medicare Other

## 2021-06-10 ENCOUNTER — Emergency Department (HOSPITAL_COMMUNITY)
Admission: EM | Admit: 2021-06-10 | Discharge: 2021-06-10 | Disposition: A | Discharge status: Home / Self Care | Payer: Medicare Other | Attending: Emergency Medicine | Admitting: Emergency Medicine

## 2021-06-10 DIAGNOSIS — I4891 Unspecified atrial fibrillation: Secondary | ICD-10-CM | POA: Diagnosis not present

## 2021-06-10 DIAGNOSIS — M545 Low back pain, unspecified: Secondary | ICD-10-CM | POA: Diagnosis not present

## 2021-06-10 DIAGNOSIS — Z7901 Long term (current) use of anticoagulants: Secondary | ICD-10-CM | POA: Diagnosis not present

## 2021-06-10 DIAGNOSIS — Z992 Dependence on renal dialysis: Secondary | ICD-10-CM | POA: Diagnosis not present

## 2021-06-10 DIAGNOSIS — S0990XA Unspecified injury of head, initial encounter: Secondary | ICD-10-CM | POA: Insufficient documentation

## 2021-06-10 DIAGNOSIS — S8012XA Contusion of left lower leg, initial encounter: Secondary | ICD-10-CM | POA: Diagnosis not present

## 2021-06-10 DIAGNOSIS — D689 Coagulation defect, unspecified: Secondary | ICD-10-CM | POA: Diagnosis not present

## 2021-06-10 DIAGNOSIS — N2581 Secondary hyperparathyroidism of renal origin: Secondary | ICD-10-CM | POA: Diagnosis not present

## 2021-06-10 DIAGNOSIS — R609 Edema, unspecified: Secondary | ICD-10-CM | POA: Insufficient documentation

## 2021-06-10 DIAGNOSIS — D631 Anemia in chronic kidney disease: Secondary | ICD-10-CM | POA: Diagnosis not present

## 2021-06-10 DIAGNOSIS — M79605 Pain in left leg: Secondary | ICD-10-CM | POA: Diagnosis not present

## 2021-06-10 DIAGNOSIS — S8992XA Unspecified injury of left lower leg, initial encounter: Secondary | ICD-10-CM | POA: Diagnosis present

## 2021-06-10 DIAGNOSIS — N186 End stage renal disease: Secondary | ICD-10-CM | POA: Diagnosis not present

## 2021-06-10 DIAGNOSIS — W01198A Fall on same level from slipping, tripping and stumbling with subsequent striking against other object, initial encounter: Secondary | ICD-10-CM | POA: Insufficient documentation

## 2021-06-10 DIAGNOSIS — R296 Repeated falls: Secondary | ICD-10-CM | POA: Diagnosis not present

## 2021-06-10 DIAGNOSIS — R011 Cardiac murmur, unspecified: Secondary | ICD-10-CM | POA: Insufficient documentation

## 2021-06-10 DIAGNOSIS — R0902 Hypoxemia: Secondary | ICD-10-CM | POA: Diagnosis not present

## 2021-06-10 DIAGNOSIS — M549 Dorsalgia, unspecified: Secondary | ICD-10-CM | POA: Diagnosis not present

## 2021-06-10 DIAGNOSIS — M47812 Spondylosis without myelopathy or radiculopathy, cervical region: Secondary | ICD-10-CM | POA: Diagnosis not present

## 2021-06-10 DIAGNOSIS — D509 Iron deficiency anemia, unspecified: Secondary | ICD-10-CM | POA: Diagnosis not present

## 2021-06-10 DIAGNOSIS — M4312 Spondylolisthesis, cervical region: Secondary | ICD-10-CM | POA: Diagnosis not present

## 2021-06-10 DIAGNOSIS — S199XXA Unspecified injury of neck, initial encounter: Secondary | ICD-10-CM | POA: Diagnosis not present

## 2021-06-10 DIAGNOSIS — R600 Localized edema: Secondary | ICD-10-CM | POA: Diagnosis not present

## 2021-06-10 DIAGNOSIS — Z23 Encounter for immunization: Secondary | ICD-10-CM | POA: Diagnosis not present

## 2021-06-10 DIAGNOSIS — Z743 Need for continuous supervision: Secondary | ICD-10-CM | POA: Diagnosis not present

## 2021-06-10 DIAGNOSIS — R9431 Abnormal electrocardiogram [ECG] [EKG]: Secondary | ICD-10-CM | POA: Diagnosis not present

## 2021-06-10 MED ORDER — ROPINIROLE HCL 0.25 MG PO TABS: 0.5000 mg | ORAL_TABLET | Freq: Every day | ORAL | 0 refills | Status: DC

## 2021-06-10 NOTE — Progress Notes (Signed)
Orthopedic Tech Progress Note Patient Details:  Steven Best 10-Jul-1966 [id number] Level 2 Trauma Patient ID: [name], male   DOB: [birth year], 55 y.o.   MRN: [id number]  [name] 06/10/2021, 11:43 AM

## 2021-06-10 NOTE — Discharge Instructions (Addendum)
Your testing today shows no signs of brain injury, no signs of spine injury, there is no signs of injury to your leg.  I have increased the dose of your nighttime restless leg medicine, you should take 0.5 mg which is 2 of your tablets at bedtime.  You will need to follow-up with your family doctor for further evaluation and ongoing care.  I would caution you against using both hydrocodone and your anxiety medication called Klonopin.  These medications can cause severe sedation when taken together and should be avoided.  Please talk to your doctor about this  Thank you for letting us take care of you today!  Please obtain all of your results from medical records or have your doctors office obtain the results - share them with your doctor - you should be seen at your doctors office in the next 2 days. Call today to arrange your follow up. Take the medications as prescribed. Please review all of the medicines and only take them if you do not have an allergy to them. Please be aware that if you are taking birth control pills, taking other prescriptions, ESPECIALLY ANTIBIOTICS may make the birth control ineffective - if this is the case, either do not engage in sexual activity or use alternative methods of birth control such as condoms until you have finished the medicine and your family doctor says it is OK to restart them. If you are on a blood thinner such as COUMADIN, be aware that any other medicine that you take may cause the coumadin to either work too much, or not enough - you should have your coumadin level rechecked in next 7 days if this is the case.  ?  It is also a possibility that you have an allergic reaction to any of the medicines that you have been prescribed - Everybody reacts differently to medications and while MOST people have no trouble with most medicines, you may have a reaction such as nausea, vomiting, rash, swelling, shortness of breath. If this is the case, please stop taking  the medicine immediately and contact your physician.   If you were given a medication in the ED such as percocet, vicodin, or morphine, be aware that these medicines are sedating and may change your ability to take care of yourself adequately for several hours after being given this medicines - you should not drive or take care of small children if you were given this medicine in the Emergency Department or if you have been prescribed these types of medicines. ?   You should return to the ER IMMEDIATELY if you develop severe or worsening symptoms.

## 2021-06-10 NOTE — ED Triage Notes (Signed)
Pt LEVEL 2 from fall. Pt is on coumadin . Pt reports x7 hrs ago pt states he fell into a pothole and hit his head. Pt reports knee pain.

## 2021-06-10 NOTE — ED Provider Notes (Signed)
[facility] EMERGENCY DEPARTMENT Provider Note   CSN: [id number] Arrival date & time: 06/10/21  1125     History Chief Complaint  Patient presents with   Steven Best is a 55 y.o. male.   Fall   This patient is a 54 year old male, he has a history of prior coronary disease, he has a history of atrial fibrillation and is currently on Coumadin, he is also a dialysis patient.  He reports that he woke up this morning and was in his usual state of health at 4:00 in the morning when he tried to get up and walk across his yard to get to the parking lot to his car.  He stepped in a pothole it was full of water, he injured his left lower extremity fell and struck his head, he then tried to get up and walk but fell again and struck his head again.  He states he fell because his leg hurt.  He denies any chest pain coughing or shortness of breath, he went to dialysis and finished his entire dialysis session but comes from the dialysis center by EMS to be evaluated for his head injury and his leg pain.  Symptoms are persistent, worse with using the left leg, not associated with any vomiting, it is the left side of his head that hurts but there is no associated hematoma injury abrasion laceration or bleeding.  Vital signs as reported by paramedics were unremarkable.  History reviewed. No pertinent past medical history.  There are no problems to display for this patient.   History reviewed. No pertinent surgical history.     History reviewed. No pertinent family history.     Home Medications Prior to Admission medications   Medication Sig Start Date End Date Taking? Authorizing Provider  amiodarone (PACERONE) 200 MG tablet Take 200 mg by mouth 2 (two) times daily.   Yes [provider]  AURYXIA 1 GM 210 MG(Fe) tablet Take 420-840 mg by mouth See admin instructions. Take 840 mg tid with meals and 420 mg bid with snacks 03/12/21  Yes [provider]  calcium carbonate (OS-CAL - DOSED IN MG OF ELEMENTAL CALCIUM) 1250 (500 Ca) MG tablet Take 1,000-1,500 mg by mouth daily. 06/01/21  Yes [provider]  cinacalcet (SENSIPAR) 90 MG tablet Take 180 mg by mouth every evening. 03/02/21  Yes [provider]  clonazePAM (KLONOPIN) 1 MG tablet Take 2 mg by mouth 2 (two) times daily as needed. 06/01/21  Yes [provider]  cyclobenzaprine (FLEXERIL) 10 MG tablet Take 10 mg by mouth 3 (three) times daily as needed for muscle spasms. 05/24/21  Yes [provider]  escitalopram (LEXAPRO) 5 MG tablet Take 5 mg by mouth daily. 05/20/21  Yes [provider]  HYDROcodone-acetaminophen (NORCO) 10-325 MG tablet Take 1 tablet by mouth 2 (two) times daily as needed for moderate pain. 05/14/21  Yes [provider]  lidocaine-prilocaine (EMLA) cream Apply 1 application topically See admin instructions. Three days weekly before dialysis 02/23/21  Yes [provider]  multivitamin (RENA-VIT) TABS tablet Take 1 tablet by mouth daily.   Yes [provider]  rosuvastatin (CRESTOR) 5 MG tablet Take 5 mg by mouth at bedtime. 05/28/21  Yes [provider]  warfarin (COUMADIN) 5 MG tablet Take 5 mg by mouth daily. 5 mg on Wednesday, Friday, Sunday and monday 12/16/20  Yes [provider]  rOPINIRole (REQUIP) 0.25 MG tablet Take 2 tablets (  0.5 mg total) by mouth at bedtime. 06/10/21 07/10/21  Steven Chapel, MD    Allergies    Patient has no known allergies.  Review of Systems   Review of Systems  All other systems reviewed and are negative.  Physical Exam Updated Vital Signs BP (!) 130/101   Pulse 83   Temp 97.8 F (36.6 C) (Oral)   Resp 18   Ht 1.854 m (6\' 1" )   Wt (!) 140.6 kg   SpO2 99%   BMI 40.90 kg/m   Physical Exam Vitals and nursing note reviewed.  Constitutional:      General: He is not in acute distress.    Appearance: He is well-developed.     Comments:  Mildly somnolent but easily arousable  HENT:     Head: Normocephalic and atraumatic.     Comments: There is mild tenderness to the left temporal region but no signs of hematoma abrasion laceration or contusion    Mouth/Throat:     Mouth: Mucous membranes are moist.     Pharynx: No oropharyngeal exudate.  Eyes:     General: No scleral icterus.       Right eye: No discharge.        Left eye: No discharge.     Conjunctiva/sclera: Conjunctivae normal.     Pupils: Pupils are equal, round, and reactive to light.  Neck:     Thyroid: No thyromegaly.     Vascular: No JVD.  Cardiovascular:     Rate and Rhythm: Normal rate and regular rhythm.     Heart sounds: Murmur heard.    No friction rub. No gallop.     Comments: Systolic murmur auscultated Pulmonary:     Effort: Pulmonary effort is normal. No respiratory distress.     Breath sounds: Normal breath sounds. No wheezing or rales.  Abdominal:     General: Bowel sounds are normal. There is no distension.     Palpations: Abdomen is soft. There is no mass.     Tenderness: There is no abdominal tenderness.  Musculoskeletal:        General: No tenderness. Normal range of motion.     Cervical back: Normal range of motion and neck supple.     Right lower leg: Edema present.     Left lower leg: Edema present.     Comments: He has bilateral normal-appearing tibial regions, ankles are supple, there is symmetrical mild edema of the legs  Lymphadenopathy:     Cervical: No cervical adenopathy.  Skin:    General: Skin is warm and dry.     Findings: No erythema or rash.     Comments: No lacerations abrasions contusions or obvious hematomas except for the left upper extremity where he has some bruising of his left shoulder.  That being said he has very supple joints diffusely  Neurological:     Coordination: Coordination normal.     Comments: Occasionally slurs words but is able to wake up and answer questions, states he does take clonazepam.  Moves  all 4 extremities with normal strength, able answer my questions at times without slurring his words.  There is no obvious facial droop, normal grip  Psychiatric:        Behavior: Behavior normal.    ED Results / Procedures / Treatments   Labs (all labs ordered are listed, but only abnormal results are displayed) Labs Reviewed - No data to display  EKG EKG Interpretation  Date/Time:  Thursday June 10 2021  11:33:11 EDT Ventricular Rate:  79 PR Interval:  43 QRS Duration: 127 QT Interval:  445 QTC Calculation: 511 R Axis:   -54 Text Interpretation: Sinus rhythm Short PR interval Nonspecific IVCD with LAD Confirmed by Steven Best [phone]) on 06/10/2021 3:08:51 PM  Radiology DG Tibia/Fibula Left  Result Date: 06/10/2021 CLINICAL DATA:  Trauma. Status post fall. Patient reports falling into a pothole 7 hours ago. EXAM: LEFT TIBIA AND FIBULA - 2 VIEW COMPARISON:  None. FINDINGS: There is no evidence of fracture or other focal bone lesions. Mild diffuse soft tissue edema. IMPRESSION: 1. No acute abnormality. 2. Soft tissue edema. Electronically Signed   By: Kerby Moors M.D.   On: 06/10/2021 12:21   CT Cervical Spine Wo Contrast  Result Date: 06/10/2021 CLINICAL DATA:  Facial trauma.  Altered mental status, falls. EXAM: CT CERVICAL SPINE WITHOUT CONTRAST TECHNIQUE: Multidetector CT imaging of the cervical spine was performed without intravenous contrast. Multiplanar CT image reconstructions were also generated. COMPARISON:  CT cervical spine 05/10/2021. FINDINGS: Moderate/severe motion degraded at the C1-C3 levels, limiting evaluation for acute fracture. Alignment: Mild cervical levocurvature. Mild reversal of the expected cervical lordosis. Trace C4-C5 grade 1 retrolisthesis. Skull base and vertebrae: Within described limitations, the atlanto-dental and basion-dental intervals appear maintained. No acute cervical spine fracture is identified Soft tissues and spinal canal: No prevertebral  fluid or swelling. No visible canal hematoma. Disc levels: Cervical spondylosis with multilevel disc space narrowing, disc bulges, central disc protrusions, endplate spurring, uncovertebral hypertrophy and facet arthrosis. Disc space narrowing is greatest at C4-C5, C5-C6 and C6-C7 (moderate). Multilevel spinal canal stenosis. Most notably, a disc bulge with endplate spurring and left-sided disc osteophyte ridge contributes to at least moderate spinal canal stenosis at C4-C5. Multilevel bony neural foraminal narrowing. Upper chest: Motion artifact limits evaluation of the lung apices. No definite airspace consolidation at the imaged levels. No visible pneumothorax. IMPRESSION: Moderate/severe motion degradation at the C1-C3 levels limits evaluation for acute cervical spine fracture. A repeat scan at these levels may be obtained for further evaluation, as clinically warranted. Within described limitations, no acute cervical spine fracture is identified. Mild reversal of the expected cervical lordosis. Mild cervical levocurvature. Trace C4-C5 grade 1 retrolisthesis Cervical spondylosis, as described. Notably, there is suspected at least moderate spinal canal stenosis at C4-C5. Electronically Signed   By: Kellie Simmering DO   On: 06/10/2021 13:06    Procedures Procedures   Medications Ordered in ED Medications - No data to display  ED Course  I have reviewed the triage vital signs and the nursing notes.  Pertinent labs & imaging results that were available during my care of the patient were reviewed by me and considered in my medical decision making (see chart for details).    MDM Rules/Calculators/A&P                          EKG performed on June 10, 2021 at 11:33 AM shows normal sinus rhythm, rate of 79 bpm, left axis deviation, QRS widened at 127 ms in the pattern of an incomplete left bundle branch block.  Normal ST segments, normal T waves, impression: Nonspecific EKG.  Will do CT scan of the brain  secondary to 2 head injuries on Coumadin, will also get imaging of his left lower extremity tibia fibula and ankle injuries though there is no obvious signs of external trauma.  The patient is agreeable  The patient's wife is now at the bedside, when  I go back to recheck him several times he continues to be able to wake up and talk to me and give me answers however his wife states that for quite some time he has been having difficulty sleeping at night, he has been placed on multiple medications including gabapentin which was ultimately discontinued because it was making him too sedated.  He was then started on ropinirole but is now at 0.25 mg which does not seem to help him sleep.  This is part of the reason that he become sleepy and confused during the day.  I also noted that he is on both Klonopin as well as hydrocodone for pain, I suspect that there is some combination of the benzodiazepines and opiate medications that is also causing oversedation.  He sees Dr. Shirline Frees, I recommended that the family member call to make a follow-up appointment closely.  The patient is stable for discharge without hypoxia tachycardia hypotension fevers or any other severe or worsening symptoms.  The CT scan of the brain looks good, CT scan cervical spine is unremarkable for trauma and his lower extremity looks normal.  Stable for discharge  Final Clinical Impression(s) / ED Diagnoses Final diagnoses:  Minor head injury, initial encounter  Contusion of left leg, initial encounter    Rx / DC Orders ED Discharge Orders          Ordered    rOPINIRole (REQUIP) 0.25 MG tablet  Daily at bedtime        06/10/21 1510             Steven Chapel, MD 06/10/21 1513

## 2021-06-12 DIAGNOSIS — N2581 Secondary hyperparathyroidism of renal origin: Secondary | ICD-10-CM | POA: Diagnosis not present

## 2021-06-12 DIAGNOSIS — Z23 Encounter for immunization: Secondary | ICD-10-CM | POA: Diagnosis not present

## 2021-06-12 DIAGNOSIS — Z7901 Long term (current) use of anticoagulants: Secondary | ICD-10-CM | POA: Diagnosis not present

## 2021-06-12 DIAGNOSIS — D631 Anemia in chronic kidney disease: Secondary | ICD-10-CM | POA: Diagnosis not present

## 2021-06-12 DIAGNOSIS — D509 Iron deficiency anemia, unspecified: Secondary | ICD-10-CM | POA: Diagnosis not present

## 2021-06-12 DIAGNOSIS — N186 End stage renal disease: Secondary | ICD-10-CM | POA: Diagnosis not present

## 2021-06-12 DIAGNOSIS — D689 Coagulation defect, unspecified: Secondary | ICD-10-CM | POA: Diagnosis not present

## 2021-06-12 DIAGNOSIS — Z992 Dependence on renal dialysis: Secondary | ICD-10-CM | POA: Diagnosis not present

## 2021-06-15 DIAGNOSIS — D509 Iron deficiency anemia, unspecified: Secondary | ICD-10-CM | POA: Diagnosis not present

## 2021-06-15 DIAGNOSIS — N186 End stage renal disease: Secondary | ICD-10-CM | POA: Diagnosis not present

## 2021-06-15 DIAGNOSIS — Z23 Encounter for immunization: Secondary | ICD-10-CM | POA: Diagnosis not present

## 2021-06-15 DIAGNOSIS — N2581 Secondary hyperparathyroidism of renal origin: Secondary | ICD-10-CM | POA: Diagnosis not present

## 2021-06-15 DIAGNOSIS — D631 Anemia in chronic kidney disease: Secondary | ICD-10-CM | POA: Diagnosis not present

## 2021-06-15 DIAGNOSIS — Z992 Dependence on renal dialysis: Secondary | ICD-10-CM | POA: Diagnosis not present

## 2021-06-15 DIAGNOSIS — Z7901 Long term (current) use of anticoagulants: Secondary | ICD-10-CM | POA: Diagnosis not present

## 2021-06-15 DIAGNOSIS — D689 Coagulation defect, unspecified: Secondary | ICD-10-CM | POA: Diagnosis not present

## 2021-06-17 DIAGNOSIS — N2581 Secondary hyperparathyroidism of renal origin: Secondary | ICD-10-CM | POA: Diagnosis not present

## 2021-06-17 DIAGNOSIS — N186 End stage renal disease: Secondary | ICD-10-CM | POA: Diagnosis not present

## 2021-06-17 DIAGNOSIS — D631 Anemia in chronic kidney disease: Secondary | ICD-10-CM | POA: Diagnosis not present

## 2021-06-17 DIAGNOSIS — D689 Coagulation defect, unspecified: Secondary | ICD-10-CM | POA: Diagnosis not present

## 2021-06-17 DIAGNOSIS — D509 Iron deficiency anemia, unspecified: Secondary | ICD-10-CM | POA: Diagnosis not present

## 2021-06-17 DIAGNOSIS — Z992 Dependence on renal dialysis: Secondary | ICD-10-CM | POA: Diagnosis not present

## 2021-06-17 DIAGNOSIS — Z7901 Long term (current) use of anticoagulants: Secondary | ICD-10-CM | POA: Diagnosis not present

## 2021-06-17 DIAGNOSIS — Z23 Encounter for immunization: Secondary | ICD-10-CM | POA: Diagnosis not present

## 2021-06-19 DIAGNOSIS — D631 Anemia in chronic kidney disease: Secondary | ICD-10-CM | POA: Diagnosis not present

## 2021-06-19 DIAGNOSIS — Z7901 Long term (current) use of anticoagulants: Secondary | ICD-10-CM | POA: Diagnosis not present

## 2021-06-19 DIAGNOSIS — D509 Iron deficiency anemia, unspecified: Secondary | ICD-10-CM | POA: Diagnosis not present

## 2021-06-19 DIAGNOSIS — N186 End stage renal disease: Secondary | ICD-10-CM | POA: Diagnosis not present

## 2021-06-19 DIAGNOSIS — N2581 Secondary hyperparathyroidism of renal origin: Secondary | ICD-10-CM | POA: Diagnosis not present

## 2021-06-19 DIAGNOSIS — Z992 Dependence on renal dialysis: Secondary | ICD-10-CM | POA: Diagnosis not present

## 2021-06-19 DIAGNOSIS — D689 Coagulation defect, unspecified: Secondary | ICD-10-CM | POA: Diagnosis not present

## 2021-06-19 DIAGNOSIS — Z23 Encounter for immunization: Secondary | ICD-10-CM | POA: Diagnosis not present

## 2021-06-22 DIAGNOSIS — N2581 Secondary hyperparathyroidism of renal origin: Secondary | ICD-10-CM | POA: Diagnosis not present

## 2021-06-22 DIAGNOSIS — D689 Coagulation defect, unspecified: Secondary | ICD-10-CM | POA: Diagnosis not present

## 2021-06-22 DIAGNOSIS — Z992 Dependence on renal dialysis: Secondary | ICD-10-CM | POA: Diagnosis not present

## 2021-06-22 DIAGNOSIS — Z23 Encounter for immunization: Secondary | ICD-10-CM | POA: Diagnosis not present

## 2021-06-22 DIAGNOSIS — D509 Iron deficiency anemia, unspecified: Secondary | ICD-10-CM | POA: Diagnosis not present

## 2021-06-22 DIAGNOSIS — N186 End stage renal disease: Secondary | ICD-10-CM | POA: Diagnosis not present

## 2021-06-22 DIAGNOSIS — Z7901 Long term (current) use of anticoagulants: Secondary | ICD-10-CM | POA: Diagnosis not present

## 2021-06-22 DIAGNOSIS — D631 Anemia in chronic kidney disease: Secondary | ICD-10-CM | POA: Diagnosis not present

## 2021-06-24 ENCOUNTER — Telehealth: Payer: Self-pay

## 2021-06-24 DIAGNOSIS — N2581 Secondary hyperparathyroidism of renal origin: Secondary | ICD-10-CM | POA: Diagnosis not present

## 2021-06-24 DIAGNOSIS — Z23 Encounter for immunization: Secondary | ICD-10-CM | POA: Diagnosis not present

## 2021-06-24 DIAGNOSIS — Z992 Dependence on renal dialysis: Secondary | ICD-10-CM | POA: Diagnosis not present

## 2021-06-24 DIAGNOSIS — D631 Anemia in chronic kidney disease: Secondary | ICD-10-CM | POA: Diagnosis not present

## 2021-06-24 DIAGNOSIS — D509 Iron deficiency anemia, unspecified: Secondary | ICD-10-CM | POA: Diagnosis not present

## 2021-06-24 DIAGNOSIS — D689 Coagulation defect, unspecified: Secondary | ICD-10-CM | POA: Diagnosis not present

## 2021-06-24 DIAGNOSIS — Z7901 Long term (current) use of anticoagulants: Secondary | ICD-10-CM | POA: Diagnosis not present

## 2021-06-24 DIAGNOSIS — N186 End stage renal disease: Secondary | ICD-10-CM | POA: Diagnosis not present

## 2021-06-24 NOTE — Telephone Encounter (Signed)
Chris received from [facility] stating they checked the pt's INR and told him to a dose because he was high. Advised that the pt is overdue and needs to have his INR checked in the office since INR was done 2 days ago. Stated he would let the pt know to call us so we can set him up an appt. Advised that we cannot dose off of INR from 2 days ago. Will wait for pt to call us.

## 2021-06-24 NOTE — Telephone Encounter (Signed)
Called pt & there was no answer and voicemail not set up.

## 2021-06-26 DIAGNOSIS — D631 Anemia in chronic kidney disease: Secondary | ICD-10-CM | POA: Diagnosis not present

## 2021-06-26 DIAGNOSIS — Z23 Encounter for immunization: Secondary | ICD-10-CM | POA: Diagnosis not present

## 2021-06-26 DIAGNOSIS — N186 End stage renal disease: Secondary | ICD-10-CM | POA: Diagnosis not present

## 2021-06-26 DIAGNOSIS — D509 Iron deficiency anemia, unspecified: Secondary | ICD-10-CM | POA: Diagnosis not present

## 2021-06-26 DIAGNOSIS — Z992 Dependence on renal dialysis: Secondary | ICD-10-CM | POA: Diagnosis not present

## 2021-06-26 DIAGNOSIS — Z7901 Long term (current) use of anticoagulants: Secondary | ICD-10-CM | POA: Diagnosis not present

## 2021-06-26 DIAGNOSIS — D689 Coagulation defect, unspecified: Secondary | ICD-10-CM | POA: Diagnosis not present

## 2021-06-26 DIAGNOSIS — N2581 Secondary hyperparathyroidism of renal origin: Secondary | ICD-10-CM | POA: Diagnosis not present

## 2021-06-27 DIAGNOSIS — I129 Hypertensive chronic kidney disease with stage 1 through stage 4 chronic kidney disease, or unspecified chronic kidney disease: Secondary | ICD-10-CM | POA: Diagnosis not present

## 2021-06-27 DIAGNOSIS — Z992 Dependence on renal dialysis: Secondary | ICD-10-CM | POA: Diagnosis not present

## 2021-06-27 DIAGNOSIS — N186 End stage renal disease: Secondary | ICD-10-CM | POA: Diagnosis not present

## 2021-06-28 ENCOUNTER — Other Ambulatory Visit: Payer: Self-pay

## 2021-06-28 ENCOUNTER — Other Ambulatory Visit: Payer: Self-pay | Admitting: Nephrology

## 2021-06-28 ENCOUNTER — Ambulatory Visit (INDEPENDENT_AMBULATORY_CARE_PROVIDER_SITE_OTHER): Payer: Medicare Other

## 2021-06-28 DIAGNOSIS — I480 Paroxysmal atrial fibrillation: Secondary | ICD-10-CM

## 2021-06-28 DIAGNOSIS — I4819 Other persistent atrial fibrillation: Secondary | ICD-10-CM | POA: Diagnosis not present

## 2021-06-28 DIAGNOSIS — R600 Localized edema: Secondary | ICD-10-CM

## 2021-06-28 DIAGNOSIS — R52 Pain, unspecified: Secondary | ICD-10-CM

## 2021-06-28 DIAGNOSIS — N186 End stage renal disease: Secondary | ICD-10-CM

## 2021-06-28 DIAGNOSIS — Z5181 Encounter for therapeutic drug level monitoring: Secondary | ICD-10-CM

## 2021-06-28 DIAGNOSIS — I820 Budd-Chiari syndrome: Secondary | ICD-10-CM

## 2021-06-28 LAB — POCT INR: INR: 1.9 — AB (ref 2.0–3.0)

## 2021-06-28 NOTE — Patient Instructions (Signed)
-   take 1.5 tablets tonight, then  - continue warfarin dosage of 1 tablet by mouth (5mg ) every day except Thursday take 1 and 1/2 tablets (7.5mg ).   Keep your green leafy vegetable intake consistent.  - Recheck INR in 2 weeks.  [facility]. [phone].

## 2021-06-28 NOTE — Telephone Encounter (Signed)
Pt has an appt in then office on today at 4pm.

## 2021-06-29 DIAGNOSIS — Z992 Dependence on renal dialysis: Secondary | ICD-10-CM | POA: Diagnosis not present

## 2021-06-29 DIAGNOSIS — D631 Anemia in chronic kidney disease: Secondary | ICD-10-CM | POA: Diagnosis not present

## 2021-06-29 DIAGNOSIS — N186 End stage renal disease: Secondary | ICD-10-CM | POA: Diagnosis not present

## 2021-06-29 DIAGNOSIS — N2581 Secondary hyperparathyroidism of renal origin: Secondary | ICD-10-CM | POA: Diagnosis not present

## 2021-06-29 DIAGNOSIS — E8770 Fluid overload, unspecified: Secondary | ICD-10-CM | POA: Diagnosis not present

## 2021-06-30 ENCOUNTER — Inpatient Hospital Stay: Admission: RE | Admit: 2021-06-30 | Payer: Medicare Other | Source: Ambulatory Visit

## 2021-06-30 ENCOUNTER — Ambulatory Visit
Admission: RE | Admit: 2021-06-30 | Discharge: 2021-06-30 | Disposition: A | Discharge status: Home / Self Care | Payer: Medicare Other | Source: Ambulatory Visit | Attending: Nephrology | Admitting: Nephrology

## 2021-06-30 ENCOUNTER — Ambulatory Visit: Payer: Medicare Other | Admitting: Internal Medicine

## 2021-06-30 ENCOUNTER — Other Ambulatory Visit: Payer: Self-pay

## 2021-06-30 ENCOUNTER — Other Ambulatory Visit: Payer: Self-pay | Admitting: Nephrology

## 2021-06-30 DIAGNOSIS — I820 Budd-Chiari syndrome: Secondary | ICD-10-CM

## 2021-06-30 DIAGNOSIS — R600 Localized edema: Secondary | ICD-10-CM

## 2021-06-30 DIAGNOSIS — Z0389 Encounter for observation for other suspected diseases and conditions ruled out: Secondary | ICD-10-CM | POA: Diagnosis not present

## 2021-06-30 DIAGNOSIS — N186 End stage renal disease: Secondary | ICD-10-CM

## 2021-06-30 DIAGNOSIS — R52 Pain, unspecified: Secondary | ICD-10-CM

## 2021-07-01 DIAGNOSIS — E8770 Fluid overload, unspecified: Secondary | ICD-10-CM | POA: Diagnosis not present

## 2021-07-01 DIAGNOSIS — N2581 Secondary hyperparathyroidism of renal origin: Secondary | ICD-10-CM | POA: Diagnosis not present

## 2021-07-01 DIAGNOSIS — N186 End stage renal disease: Secondary | ICD-10-CM | POA: Diagnosis not present

## 2021-07-01 DIAGNOSIS — D631 Anemia in chronic kidney disease: Secondary | ICD-10-CM | POA: Diagnosis not present

## 2021-07-01 DIAGNOSIS — Z992 Dependence on renal dialysis: Secondary | ICD-10-CM | POA: Diagnosis not present

## 2021-07-03 DIAGNOSIS — N2581 Secondary hyperparathyroidism of renal origin: Secondary | ICD-10-CM | POA: Diagnosis not present

## 2021-07-03 DIAGNOSIS — Z992 Dependence on renal dialysis: Secondary | ICD-10-CM | POA: Diagnosis not present

## 2021-07-03 DIAGNOSIS — D631 Anemia in chronic kidney disease: Secondary | ICD-10-CM | POA: Diagnosis not present

## 2021-07-03 DIAGNOSIS — E8770 Fluid overload, unspecified: Secondary | ICD-10-CM | POA: Diagnosis not present

## 2021-07-03 DIAGNOSIS — N186 End stage renal disease: Secondary | ICD-10-CM | POA: Diagnosis not present

## 2021-07-05 ENCOUNTER — Emergency Department (HOSPITAL_COMMUNITY): Payer: Medicare Other

## 2021-07-05 ENCOUNTER — Other Ambulatory Visit: Payer: Self-pay

## 2021-07-05 ENCOUNTER — Encounter (HOSPITAL_COMMUNITY): Payer: Self-pay | Admitting: *Deleted

## 2021-07-05 ENCOUNTER — Inpatient Hospital Stay (HOSPITAL_COMMUNITY)
Admission: EM | Admit: 2021-07-05 | Discharge: 2021-07-08 | DRG: 091 | Disposition: A | Discharge status: Home / Self Care | Payer: Medicare Other | Attending: Internal Medicine | Admitting: Internal Medicine

## 2021-07-05 DIAGNOSIS — I4891 Unspecified atrial fibrillation: Secondary | ICD-10-CM | POA: Diagnosis present

## 2021-07-05 DIAGNOSIS — T45511A Poisoning by anticoagulants, accidental (unintentional), initial encounter: Secondary | ICD-10-CM | POA: Diagnosis present

## 2021-07-05 DIAGNOSIS — N186 End stage renal disease: Secondary | ICD-10-CM | POA: Diagnosis present

## 2021-07-05 DIAGNOSIS — G9341 Metabolic encephalopathy: Secondary | ICD-10-CM | POA: Diagnosis present

## 2021-07-05 DIAGNOSIS — D631 Anemia in chronic kidney disease: Secondary | ICD-10-CM

## 2021-07-05 DIAGNOSIS — R4182 Altered mental status, unspecified: Principal | ICD-10-CM

## 2021-07-05 DIAGNOSIS — Z79899 Other long term (current) drug therapy: Secondary | ICD-10-CM

## 2021-07-05 DIAGNOSIS — T1490XA Injury, unspecified, initial encounter: Secondary | ICD-10-CM

## 2021-07-05 DIAGNOSIS — S20211A Contusion of right front wall of thorax, initial encounter: Secondary | ICD-10-CM

## 2021-07-05 DIAGNOSIS — E871 Hypo-osmolality and hyponatremia: Secondary | ICD-10-CM

## 2021-07-05 DIAGNOSIS — N189 Chronic kidney disease, unspecified: Secondary | ICD-10-CM

## 2021-07-05 DIAGNOSIS — I10 Essential (primary) hypertension: Secondary | ICD-10-CM | POA: Diagnosis present

## 2021-07-05 DIAGNOSIS — N261 Atrophy of kidney (terminal): Secondary | ICD-10-CM | POA: Diagnosis not present

## 2021-07-05 DIAGNOSIS — R0902 Hypoxemia: Secondary | ICD-10-CM | POA: Diagnosis not present

## 2021-07-05 DIAGNOSIS — I959 Hypotension, unspecified: Secondary | ICD-10-CM | POA: Diagnosis not present

## 2021-07-05 DIAGNOSIS — M47814 Spondylosis without myelopathy or radiculopathy, thoracic region: Secondary | ICD-10-CM | POA: Diagnosis not present

## 2021-07-05 DIAGNOSIS — I2510 Atherosclerotic heart disease of native coronary artery without angina pectoris: Secondary | ICD-10-CM | POA: Diagnosis not present

## 2021-07-05 DIAGNOSIS — B957 Other staphylococcus as the cause of diseases classified elsewhere: Secondary | ICD-10-CM | POA: Diagnosis present

## 2021-07-05 DIAGNOSIS — G928 Other toxic encephalopathy: Principal | ICD-10-CM | POA: Diagnosis present

## 2021-07-05 DIAGNOSIS — R7881 Bacteremia: Secondary | ICD-10-CM | POA: Diagnosis not present

## 2021-07-05 DIAGNOSIS — I639 Cerebral infarction, unspecified: Secondary | ICD-10-CM | POA: Diagnosis not present

## 2021-07-05 DIAGNOSIS — F1011 Alcohol abuse, in remission: Secondary | ICD-10-CM | POA: Diagnosis present

## 2021-07-05 DIAGNOSIS — Z8249 Family history of ischemic heart disease and other diseases of the circulatory system: Secondary | ICD-10-CM

## 2021-07-05 DIAGNOSIS — N250 Renal osteodystrophy: Secondary | ICD-10-CM | POA: Diagnosis not present

## 2021-07-05 DIAGNOSIS — I120 Hypertensive chronic kidney disease with stage 5 chronic kidney disease or end stage renal disease: Secondary | ICD-10-CM | POA: Diagnosis present

## 2021-07-05 DIAGNOSIS — R791 Abnormal coagulation profile: Secondary | ICD-10-CM | POA: Diagnosis present

## 2021-07-05 DIAGNOSIS — I4821 Permanent atrial fibrillation: Secondary | ICD-10-CM | POA: Diagnosis not present

## 2021-07-05 DIAGNOSIS — Z992 Dependence on renal dialysis: Secondary | ICD-10-CM

## 2021-07-05 DIAGNOSIS — D649 Anemia, unspecified: Secondary | ICD-10-CM | POA: Diagnosis not present

## 2021-07-05 DIAGNOSIS — R6889 Other general symptoms and signs: Secondary | ICD-10-CM | POA: Diagnosis not present

## 2021-07-05 DIAGNOSIS — I6523 Occlusion and stenosis of bilateral carotid arteries: Secondary | ICD-10-CM | POA: Diagnosis not present

## 2021-07-05 DIAGNOSIS — Z888 Allergy status to other drugs, medicaments and biological substances status: Secondary | ICD-10-CM

## 2021-07-05 DIAGNOSIS — N2581 Secondary hyperparathyroidism of renal origin: Secondary | ICD-10-CM | POA: Diagnosis not present

## 2021-07-05 DIAGNOSIS — I482 Chronic atrial fibrillation, unspecified: Secondary | ICD-10-CM | POA: Diagnosis not present

## 2021-07-05 DIAGNOSIS — Y92410 Unspecified street and highway as the place of occurrence of the external cause: Secondary | ICD-10-CM | POA: Diagnosis not present

## 2021-07-05 DIAGNOSIS — M898X9 Other specified disorders of bone, unspecified site: Secondary | ICD-10-CM | POA: Diagnosis present

## 2021-07-05 DIAGNOSIS — F419 Anxiety disorder, unspecified: Secondary | ICD-10-CM | POA: Diagnosis present

## 2021-07-05 DIAGNOSIS — R4781 Slurred speech: Secondary | ICD-10-CM | POA: Diagnosis present

## 2021-07-05 DIAGNOSIS — I950 Idiopathic hypotension: Secondary | ICD-10-CM | POA: Diagnosis not present

## 2021-07-05 DIAGNOSIS — K219 Gastro-esophageal reflux disease without esophagitis: Secondary | ICD-10-CM | POA: Diagnosis present

## 2021-07-05 DIAGNOSIS — Z041 Encounter for examination and observation following transport accident: Secondary | ICD-10-CM | POA: Diagnosis not present

## 2021-07-05 DIAGNOSIS — G43909 Migraine, unspecified, not intractable, without status migrainosus: Secondary | ICD-10-CM | POA: Diagnosis not present

## 2021-07-05 DIAGNOSIS — I517 Cardiomegaly: Secondary | ICD-10-CM | POA: Diagnosis not present

## 2021-07-05 DIAGNOSIS — D8689 Sarcoidosis of other sites: Secondary | ICD-10-CM | POA: Diagnosis not present

## 2021-07-05 DIAGNOSIS — Z8673 Personal history of transient ischemic attack (TIA), and cerebral infarction without residual deficits: Secondary | ICD-10-CM | POA: Diagnosis not present

## 2021-07-05 DIAGNOSIS — Z20822 Contact with and (suspected) exposure to covid-19: Secondary | ICD-10-CM | POA: Diagnosis not present

## 2021-07-05 DIAGNOSIS — M47816 Spondylosis without myelopathy or radiculopathy, lumbar region: Secondary | ICD-10-CM | POA: Diagnosis not present

## 2021-07-05 DIAGNOSIS — Z7901 Long term (current) use of anticoagulants: Secondary | ICD-10-CM | POA: Diagnosis not present

## 2021-07-05 DIAGNOSIS — E8770 Fluid overload, unspecified: Secondary | ICD-10-CM | POA: Diagnosis not present

## 2021-07-05 DIAGNOSIS — R531 Weakness: Secondary | ICD-10-CM | POA: Diagnosis not present

## 2021-07-05 DIAGNOSIS — J849 Interstitial pulmonary disease, unspecified: Secondary | ICD-10-CM | POA: Diagnosis not present

## 2021-07-05 DIAGNOSIS — K828 Other specified diseases of gallbladder: Secondary | ICD-10-CM | POA: Diagnosis not present

## 2021-07-05 DIAGNOSIS — R404 Transient alteration of awareness: Secondary | ICD-10-CM | POA: Diagnosis not present

## 2021-07-05 DIAGNOSIS — T45515A Adverse effect of anticoagulants, initial encounter: Secondary | ICD-10-CM | POA: Diagnosis present

## 2021-07-05 DIAGNOSIS — J811 Chronic pulmonary edema: Secondary | ICD-10-CM | POA: Diagnosis not present

## 2021-07-05 DIAGNOSIS — Z743 Need for continuous supervision: Secondary | ICD-10-CM | POA: Diagnosis not present

## 2021-07-05 HISTORY — DX: Unspecified atrial fibrillation: I48.91

## 2021-07-05 HISTORY — DX: Disorder of kidney and ureter, unspecified: N28.9

## 2021-07-05 HISTORY — DX: Essential (primary) hypertension: I10

## 2021-07-05 LAB — CBC
HCT: 27.1 % — ABNORMAL LOW (ref 39.0–52.0)
HCT: 26.9 % — ABNORMAL LOW (ref 39.0–52.0)
Hemoglobin: 8.2 g/dL — ABNORMAL LOW (ref 13.0–17.0)
Hemoglobin: 8.3 g/dL — ABNORMAL LOW (ref 13.0–17.0)
MCH: 30 pg (ref 26.0–34.0)
MCH: 30.4 pg (ref 26.0–34.0)
MCHC: 30.3 g/dL (ref 30.0–36.0)
MCHC: 30.9 g/dL (ref 30.0–36.0)
MCV: 99.3 fL (ref 80.0–100.0)
MCV: 98.5 fL (ref 80.0–100.0)
Platelets: 170 10*3/uL (ref 150–400)
Platelets: 193 10*3/uL (ref 150–400)
RBC: 2.73 MIL/uL — ABNORMAL LOW (ref 4.22–5.81)
RBC: 2.73 MIL/uL — ABNORMAL LOW (ref 4.22–5.81)
RDW: 15.7 % — ABNORMAL HIGH (ref 11.5–15.5)
RDW: 15.9 % — ABNORMAL HIGH (ref 11.5–15.5)
WBC: 6.7 10*3/uL (ref 4.0–10.5)
WBC: 7.1 10*3/uL (ref 4.0–10.5)
nRBC: 0 % (ref 0.0–0.2)
nRBC: 0 % (ref 0.0–0.2)

## 2021-07-05 LAB — COMPREHENSIVE METABOLIC PANEL
ALT: 13 U/L (ref 0–44)
AST: 17 U/L (ref 15–41)
Albumin: 3.5 g/dL (ref 3.5–5.0)
Alkaline Phosphatase: 49 U/L (ref 38–126)
Anion gap: 14 (ref 5–15)
BUN: 28 mg/dL — ABNORMAL HIGH (ref 6–20)
CO2: 23 mmol/L (ref 22–32)
Calcium: 9.2 mg/dL (ref 8.9–10.3)
Chloride: 94 mmol/L — ABNORMAL LOW (ref 98–111)
Creatinine, Ser: 5.85 mg/dL — ABNORMAL HIGH (ref 0.61–1.24)
GFR, Estimated: 11 mL/min — ABNORMAL LOW (ref >60)
Glucose, Bld: 93 mg/dL (ref 70–99)
Potassium: 4.2 mmol/L (ref 3.5–5.1)
Sodium: 131 mmol/L — ABNORMAL LOW (ref 135–145)
Total Bilirubin: 0.9 mg/dL (ref 0.3–1.2)
Total Protein: 6.8 g/dL (ref 6.5–8.1)

## 2021-07-05 LAB — URINALYSIS, ROUTINE W REFLEX MICROSCOPIC
Bacteria, UA: NONE SEEN
Bilirubin Urine: NEGATIVE
Glucose, UA: >=500 mg/dL — AB
Hgb urine dipstick: NEGATIVE
Ketones, ur: NEGATIVE mg/dL
Leukocytes,Ua: NEGATIVE
Nitrite: NEGATIVE
Protein, ur: NEGATIVE mg/dL
Specific Gravity, Urine: 1.025 (ref 1.005–1.030)
pH: 7 (ref 5.0–8.0)

## 2021-07-05 LAB — BASIC METABOLIC PANEL
Anion gap: 13 (ref 5–15)
BUN: 30 mg/dL — ABNORMAL HIGH (ref 6–20)
CO2: 23 mmol/L (ref 22–32)
Calcium: 8.8 mg/dL — ABNORMAL LOW (ref 8.9–10.3)
Chloride: 94 mmol/L — ABNORMAL LOW (ref 98–111)
Creatinine, Ser: 6.38 mg/dL — ABNORMAL HIGH (ref 0.61–1.24)
GFR, Estimated: 10 mL/min — ABNORMAL LOW (ref >60)
Glucose, Bld: 101 mg/dL — ABNORMAL HIGH (ref 70–99)
Potassium: 4.5 mmol/L (ref 3.5–5.1)
Sodium: 130 mmol/L — ABNORMAL LOW (ref 135–145)

## 2021-07-05 LAB — I-STAT CHEM 8, ED
BUN: 39 mg/dL — ABNORMAL HIGH (ref 6–20)
Calcium, Ion: 0.91 mmol/L — ABNORMAL LOW (ref 1.15–1.40)
Chloride: 96 mmol/L — ABNORMAL LOW (ref 98–111)
Creatinine, Ser: 6.2 mg/dL — ABNORMAL HIGH (ref 0.61–1.24)
Glucose, Bld: 90 mg/dL (ref 70–99)
HCT: 28 % — ABNORMAL LOW (ref 39.0–52.0)
Hemoglobin: 9.5 g/dL — ABNORMAL LOW (ref 13.0–17.0)
Potassium: 5 mmol/L (ref 3.5–5.1)
Sodium: 128 mmol/L — ABNORMAL LOW (ref 135–145)
TCO2: 26 mmol/L (ref 22–32)

## 2021-07-05 LAB — SAMPLE TO BLOOD BANK

## 2021-07-05 LAB — VITAMIN B12: Vitamin B-12: 337 pg/mL (ref 180–914)

## 2021-07-05 LAB — TSH: TSH: 2.32 u[IU]/mL (ref 0.350–4.500)

## 2021-07-05 LAB — PROTIME-INR
INR: 5.2 (ref 0.8–1.2)
Prothrombin Time: 47.5 seconds — ABNORMAL HIGH (ref 11.4–15.2)

## 2021-07-05 LAB — RESP PANEL BY RT-PCR (FLU A&B, COVID) ARPGX2
Influenza A by PCR: NEGATIVE
Influenza B by PCR: NEGATIVE
SARS Coronavirus 2 by RT PCR: NEGATIVE

## 2021-07-05 LAB — HIV ANTIBODY (ROUTINE TESTING W REFLEX): HIV Screen 4th Generation wRfx: NONREACTIVE

## 2021-07-05 LAB — LACTIC ACID, PLASMA: Lactic Acid, Venous: 0.9 mmol/L (ref 0.5–1.9)

## 2021-07-05 LAB — ETHANOL: Alcohol, Ethyl (B): <10 mg/dL (ref <10)

## 2021-07-05 LAB — AMMONIA: Ammonia: 22 umol/L (ref 9–35)

## 2021-07-05 MED ORDER — CINACALCET HCL 30 MG PO TABS: 180.0000 mg | ORAL_TABLET | Freq: Every evening | ORAL | Status: DC

## 2021-07-05 MED ORDER — HYDRALAZINE HCL 20 MG/ML IJ SOLN: 10.0000 mg | INTRAMUSCULAR | Status: DC | PRN

## 2021-07-05 MED ORDER — ONDANSETRON HCL 4 MG/2ML IJ SOLN
4.0000 mg | Freq: Four times a day (QID) | INTRAMUSCULAR | Status: DC | PRN
  Filled 2021-07-05: qty 2

## 2021-07-05 MED ORDER — DM-GUAIFENESIN ER 30-600 MG PO TB12
1.0000 | ORAL_TABLET | Freq: Two times a day (BID) | ORAL | Status: DC | PRN
  Administered 2021-07-06: 1 via ORAL
  Filled 2021-07-05 (×2): qty 1

## 2021-07-05 MED ORDER — RENA-VITE PO TABS
1.0000 | ORAL_TABLET | Freq: Every day | ORAL | Status: DC
  Administered 2021-07-05 – 2021-07-08 (×4): 1 via ORAL
  Filled 2021-07-05 (×5): qty 1

## 2021-07-05 MED ORDER — ACETAMINOPHEN 325 MG PO TABS
650.0000 mg | ORAL_TABLET | Freq: Four times a day (QID) | ORAL | Status: DC | PRN
  Administered 2021-07-06 – 2021-07-08 (×3): 650 mg via ORAL
  Filled 2021-07-05 (×3): qty 2

## 2021-07-05 MED ORDER — IOHEXOL 350 MG/ML SOLN
100.0000 mL | Freq: Once | INTRAVENOUS | Status: AC | PRN
  Administered 2021-07-05: 100 mL via INTRAVENOUS

## 2021-07-05 MED ORDER — DOXERCALCIFEROL 4 MCG/2ML IV SOLN
4.0000 ug | INTRAVENOUS | Status: DC
  Administered 2021-07-06: 4 ug via INTRAVENOUS

## 2021-07-05 MED ORDER — OXYCODONE HCL 5 MG PO TABS
5.0000 mg | ORAL_TABLET | ORAL | Status: DC | PRN
  Administered 2021-07-05 – 2021-07-06 (×3): 5 mg via ORAL
  Filled 2021-07-05 (×3): qty 1

## 2021-07-05 MED ORDER — ONDANSETRON HCL 4 MG PO TABS: 4.0000 mg | ORAL_TABLET | Freq: Four times a day (QID) | ORAL | Status: DC | PRN

## 2021-07-05 MED ORDER — CALCIUM GLUCONATE-NACL 2-0.675 GM/100ML-% IV SOLN
2.0000 g | Freq: Once | INTRAVENOUS | Status: AC
  Administered 2021-07-05: 2000 mg via INTRAVENOUS
  Filled 2021-07-05: qty 100

## 2021-07-05 MED ORDER — DARBEPOETIN ALFA 100 MCG/0.5ML IJ SOSY
100.0000 ug | PREFILLED_SYRINGE | INTRAMUSCULAR | Status: DC
  Administered 2021-07-06: 100 ug via INTRAVENOUS

## 2021-07-05 MED ORDER — SODIUM CHLORIDE 0.9 % IV SOLN
100.0000 mg | INTRAVENOUS | Status: DC
  Administered 2021-07-06: 100 mg via INTRAVENOUS
  Filled 2021-07-05 (×4): qty 5

## 2021-07-05 MED ORDER — AMIODARONE HCL 200 MG PO TABS
200.0000 mg | ORAL_TABLET | Freq: Every day | ORAL | Status: DC
  Administered 2021-07-05 – 2021-07-07 (×3): 200 mg via ORAL
  Filled 2021-07-05 (×3): qty 1

## 2021-07-05 MED ORDER — IPRATROPIUM-ALBUTEROL 0.5-2.5 (3) MG/3ML IN SOLN: 3.0000 mL | RESPIRATORY_TRACT | Status: DC | PRN

## 2021-07-05 MED ORDER — FERRIC CITRATE 1 GM 210 MG(FE) PO TABS
840.0000 mg | ORAL_TABLET | Freq: Three times a day (TID) | ORAL | Status: DC
  Administered 2021-07-06 – 2021-07-08 (×5): 840 mg via ORAL
  Filled 2021-07-05 (×7): qty 4

## 2021-07-05 MED ORDER — ROSUVASTATIN CALCIUM 5 MG PO TABS
5.0000 mg | ORAL_TABLET | Freq: Every day | ORAL | Status: DC
  Administered 2021-07-05 – 2021-07-07 (×3): 5 mg via ORAL
  Filled 2021-07-05 (×3): qty 1

## 2021-07-05 MED ORDER — CHLORHEXIDINE GLUCONATE CLOTH 2 % EX PADS
6.0000 | MEDICATED_PAD | Freq: Every day | CUTANEOUS | Status: DC
  Administered 2021-07-07: 6 via TOPICAL

## 2021-07-05 MED ORDER — ACETAMINOPHEN 650 MG RE SUPP: 650.0000 mg | Freq: Four times a day (QID) | RECTAL | Status: DC | PRN

## 2021-07-05 NOTE — ED Notes (Signed)
Pt repositioned in bed, replaced central line dressing
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
## 2021-07-05 NOTE — ED Notes (Signed)
Pt continues to remove pulse oximetry cord
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
## 2021-07-05 NOTE — ED Notes (Signed)
Dr. Gardner at bedside 

## 2021-07-05 NOTE — ED Notes (Signed)
Multiple failed PIV attempts.
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
## 2021-07-05 NOTE — ED Notes (Signed)
Pulse ox and monitoring leads replaced again. Hematoma noted to R IJ placement

## 2021-07-05 NOTE — ED Notes (Signed)
Delay going to CT due to IV access
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
## 2021-07-05 NOTE — Procedures (Signed)
   Procedure Note  Date: 07/05/2021  Procedure: central venous catheter placement--right, internal jugular vein, with ultrasound guidance  Pre-op diagnosis: inadequate IV access, unable to obtain additional peripheral access Post-op diagnosis: same  Surgeon: Jesusita Oka, MD  Anesthesia: local  EBL: <5cc Drains/Implants:     triple lumen central venous catheter  Description of procedure: This procedure was performed emergently, therefore informed consent was not obtained, but was performed under sterile conditions. Five ccs of local anesthetic was infiltrated at the site of venous access. The left femoral vein was localized using ultrasound guidance, accessed using an introducer needle, and a guidewire passed through the needle. The needle was removed and a skin nick was made. The tract was dilated and the central venous catheter advanced over the guidewire followed by removal of the guidewire. The guidewire became deformed during removal and after removal, none of the ports would aspirate. The catheter was then removed and pressure held.   The right neck was prepped and draped in the usual sterile fashion and while maintaining cervical spine precautions. The right internal jugular vein was localized with ultrasound guidance. Five ccs of local anesthetic was infiltrated at the site of venous access. The right internal jugular vein was accessed using an introducer needle and a guidewire passed through the needle. The needle was removed and a skin nick was made. The tract was dilated and the central venous catheter advanced over the guidewire followed by removal of the guidewire. All ports drew blood easily and all were flushed with saline. The catheter was secured to the skin with suture and a sterile dressing. The patient tolerated the procedure well. There were no immediate complications. Follow up chest x-ray was ordered to confirm positioning and the absence of a pneumothorax.   Jesusita Oka, MD General and [facility]

## 2021-07-05 NOTE — ED Notes (Signed)
Monitoring leads replaced, pulse ox placed on forehead, oxygen sats 96% RA

## 2021-07-05 NOTE — ED Notes (Signed)
Paula NT and this RN cleaned pt's IJ site, placed another new dressing, all lines drew back blood and flushed. Pt then placed in mesh panties, gown, green mitten placed to right hand d/t pulling at his IJ site. Pt then moved into a hospital bed for comfort. Pt's IJ dressing has been replaced several times, pt's beard shaved around site.
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
## 2021-07-05 NOTE — ED Notes (Signed)
Pt very restless in the bed, reports hx of restless leg.
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
## 2021-07-06 DIAGNOSIS — K219 Gastro-esophageal reflux disease without esophagitis: Secondary | ICD-10-CM | POA: Diagnosis present

## 2021-07-06 DIAGNOSIS — G43909 Migraine, unspecified, not intractable, without status migrainosus: Secondary | ICD-10-CM | POA: Diagnosis present

## 2021-07-06 DIAGNOSIS — Z8249 Family history of ischemic heart disease and other diseases of the circulatory system: Secondary | ICD-10-CM | POA: Diagnosis not present

## 2021-07-06 DIAGNOSIS — N2581 Secondary hyperparathyroidism of renal origin: Secondary | ICD-10-CM | POA: Diagnosis present

## 2021-07-06 DIAGNOSIS — I4821 Permanent atrial fibrillation: Secondary | ICD-10-CM | POA: Diagnosis present

## 2021-07-06 DIAGNOSIS — Z992 Dependence on renal dialysis: Secondary | ICD-10-CM | POA: Diagnosis not present

## 2021-07-06 DIAGNOSIS — D631 Anemia in chronic kidney disease: Secondary | ICD-10-CM | POA: Diagnosis present

## 2021-07-06 DIAGNOSIS — Z20822 Contact with and (suspected) exposure to covid-19: Secondary | ICD-10-CM | POA: Diagnosis present

## 2021-07-06 DIAGNOSIS — G928 Other toxic encephalopathy: Secondary | ICD-10-CM | POA: Diagnosis present

## 2021-07-06 DIAGNOSIS — Z7901 Long term (current) use of anticoagulants: Secondary | ICD-10-CM | POA: Diagnosis not present

## 2021-07-06 DIAGNOSIS — E871 Hypo-osmolality and hyponatremia: Secondary | ICD-10-CM | POA: Diagnosis present

## 2021-07-06 DIAGNOSIS — R4781 Slurred speech: Secondary | ICD-10-CM | POA: Diagnosis present

## 2021-07-06 DIAGNOSIS — I120 Hypertensive chronic kidney disease with stage 5 chronic kidney disease or end stage renal disease: Secondary | ICD-10-CM | POA: Diagnosis present

## 2021-07-06 DIAGNOSIS — G9341 Metabolic encephalopathy: Secondary | ICD-10-CM | POA: Diagnosis not present

## 2021-07-06 DIAGNOSIS — T45511A Poisoning by anticoagulants, accidental (unintentional), initial encounter: Secondary | ICD-10-CM | POA: Diagnosis not present

## 2021-07-06 DIAGNOSIS — F419 Anxiety disorder, unspecified: Secondary | ICD-10-CM | POA: Diagnosis present

## 2021-07-06 DIAGNOSIS — B957 Other staphylococcus as the cause of diseases classified elsewhere: Secondary | ICD-10-CM | POA: Diagnosis present

## 2021-07-06 DIAGNOSIS — Y92410 Unspecified street and highway as the place of occurrence of the external cause: Secondary | ICD-10-CM | POA: Diagnosis not present

## 2021-07-06 DIAGNOSIS — R791 Abnormal coagulation profile: Secondary | ICD-10-CM | POA: Diagnosis present

## 2021-07-06 DIAGNOSIS — R7881 Bacteremia: Secondary | ICD-10-CM | POA: Diagnosis present

## 2021-07-06 DIAGNOSIS — Z79899 Other long term (current) drug therapy: Secondary | ICD-10-CM | POA: Diagnosis not present

## 2021-07-06 DIAGNOSIS — N186 End stage renal disease: Secondary | ICD-10-CM | POA: Diagnosis present

## 2021-07-06 DIAGNOSIS — I959 Hypotension, unspecified: Secondary | ICD-10-CM | POA: Diagnosis present

## 2021-07-06 DIAGNOSIS — Z888 Allergy status to other drugs, medicaments and biological substances status: Secondary | ICD-10-CM | POA: Diagnosis not present

## 2021-07-06 DIAGNOSIS — D8689 Sarcoidosis of other sites: Secondary | ICD-10-CM | POA: Diagnosis present

## 2021-07-06 DIAGNOSIS — F1011 Alcohol abuse, in remission: Secondary | ICD-10-CM | POA: Diagnosis present

## 2021-07-06 LAB — BASIC METABOLIC PANEL
Anion gap: 13 (ref 5–15)
BUN: 40 mg/dL — ABNORMAL HIGH (ref 6–20)
CO2: 23 mmol/L (ref 22–32)
Calcium: 8.7 mg/dL — ABNORMAL LOW (ref 8.9–10.3)
Chloride: 93 mmol/L — ABNORMAL LOW (ref 98–111)
Creatinine, Ser: 7.37 mg/dL — ABNORMAL HIGH (ref 0.61–1.24)
GFR, Estimated: 8 mL/min — ABNORMAL LOW (ref >60)
Glucose, Bld: 94 mg/dL (ref 70–99)
Potassium: 4.5 mmol/L (ref 3.5–5.1)
Sodium: 129 mmol/L — ABNORMAL LOW (ref 135–145)

## 2021-07-06 LAB — BLOOD CULTURE ID PANEL (REFLEXED) - BCID2

## 2021-07-06 LAB — PROTIME-INR
INR: 7.9 (ref 0.8–1.2)
INR: 3.4 — ABNORMAL HIGH (ref 0.8–1.2)
Prothrombin Time: 66.3 seconds — ABNORMAL HIGH (ref 11.4–15.2)
Prothrombin Time: 34.6 seconds — ABNORMAL HIGH (ref 11.4–15.2)

## 2021-07-06 LAB — CBC
HCT: 26 % — ABNORMAL LOW (ref 39.0–52.0)
Hemoglobin: 8.3 g/dL — ABNORMAL LOW (ref 13.0–17.0)
MCH: 31.1 pg (ref 26.0–34.0)
MCHC: 31.9 g/dL (ref 30.0–36.0)
MCV: 97.4 fL (ref 80.0–100.0)
Platelets: 208 10*3/uL (ref 150–400)
RBC: 2.67 MIL/uL — ABNORMAL LOW (ref 4.22–5.81)
RDW: 15.9 % — ABNORMAL HIGH (ref 11.5–15.5)
WBC: 7.2 10*3/uL (ref 4.0–10.5)
nRBC: 0 % (ref 0.0–0.2)

## 2021-07-06 LAB — MAGNESIUM: Magnesium: 2.1 mg/dL (ref 1.7–2.4)

## 2021-07-06 MED ORDER — ESCITALOPRAM OXALATE 10 MG PO TABS
5.0000 mg | ORAL_TABLET | Freq: Every day | ORAL | Status: DC
  Administered 2021-07-07 – 2021-07-08 (×2): 5 mg via ORAL
  Filled 2021-07-06 (×2): qty 1

## 2021-07-06 MED ORDER — VITAMIN K1 10 MG/ML IJ SOLN
10.0000 mg | Freq: Once | INTRAVENOUS | Status: AC
  Administered 2021-07-06: 10 mg via INTRAVENOUS
  Filled 2021-07-06: qty 1

## 2021-07-06 NOTE — ED Notes (Signed)
Lab called 7.9 INR
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
## 2021-07-06 NOTE — ED Notes (Signed)
Trauma End- the "trauma end" would not allow this RN to select due to start time not being selected.
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
## 2021-07-06 NOTE — ED Notes (Signed)
Attempted report x 2 
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
## 2021-07-06 NOTE — ED Notes (Signed)
Report given to dialysis RN

## 2021-07-06 NOTE — Progress Notes (Signed)
This is the receiving RN's phone number for report.

## 2021-07-06 NOTE — Progress Notes (Signed)
OT Cancellation Note  Patient Details Name: Steven Best MRN: [id number] DOB: 08-04-1966   Cancelled Treatment:    Reason Eval/Treat Not Completed: Patient at procedure or test/ unavailable  [name],HILLARY 07/06/2021, 11:00 AM Maurie Boettcher, OT/L   Acute OT Clinical Specialist Acute Rehabilitation Services Pager [phone] Office [phone]

## 2021-07-07 ENCOUNTER — Inpatient Hospital Stay (HOSPITAL_COMMUNITY): Payer: Medicare Other

## 2021-07-07 LAB — MAGNESIUM: Magnesium: 2 mg/dL (ref 1.7–2.4)

## 2021-07-07 LAB — GLUCOSE, CAPILLARY: Glucose-Capillary: 101 mg/dL — ABNORMAL HIGH (ref 70–99)

## 2021-07-07 LAB — CBC
HCT: 25.8 % — ABNORMAL LOW (ref 39.0–52.0)
Hemoglobin: 7.9 g/dL — ABNORMAL LOW (ref 13.0–17.0)
MCH: 30.3 pg (ref 26.0–34.0)
MCHC: 30.6 g/dL (ref 30.0–36.0)
MCV: 98.9 fL (ref 80.0–100.0)
Platelets: 195 10*3/uL (ref 150–400)
RBC: 2.61 MIL/uL — ABNORMAL LOW (ref 4.22–5.81)
RDW: 15.4 % (ref 11.5–15.5)
WBC: 6.9 10*3/uL (ref 4.0–10.5)
nRBC: 0 % (ref 0.0–0.2)

## 2021-07-07 LAB — PROTIME-INR
INR: 2 — ABNORMAL HIGH (ref 0.8–1.2)
Prothrombin Time: 22.2 seconds — ABNORMAL HIGH (ref 11.4–15.2)

## 2021-07-07 LAB — BASIC METABOLIC PANEL
Anion gap: 14 (ref 5–15)
BUN: 21 mg/dL — ABNORMAL HIGH (ref 6–20)
CO2: 24 mmol/L (ref 22–32)
Calcium: 9.1 mg/dL (ref 8.9–10.3)
Chloride: 94 mmol/L — ABNORMAL LOW (ref 98–111)
Creatinine, Ser: 4.59 mg/dL — ABNORMAL HIGH (ref 0.61–1.24)
GFR, Estimated: 14 mL/min — ABNORMAL LOW (ref >60)
Glucose, Bld: 86 mg/dL (ref 70–99)
Potassium: 4 mmol/L (ref 3.5–5.1)
Sodium: 132 mmol/L — ABNORMAL LOW (ref 135–145)

## 2021-07-07 MED ORDER — WARFARIN SODIUM 2.5 MG PO TABS
2.5000 mg | ORAL_TABLET | Freq: Once | ORAL | Status: AC
  Administered 2021-07-07: 2.5 mg via ORAL
  Filled 2021-07-07: qty 1

## 2021-07-07 MED ORDER — WARFARIN - PHARMACIST DOSING INPATIENT: Freq: Every day | Status: DC
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
## 2021-07-07 NOTE — Progress Notes (Signed)
Patient had Rt. IJ HD cath with pigtail. Talked patient's RN that it is okay to discontinue HD cath with INR 2.0. Nurse said that it is okay and Dr. Reesa Chew wanted to take it out today. Removed HD cath without any complications. Hold it for 5 min, no bleeding notice. HS [employer]
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
## 2021-07-07 NOTE — Plan of Care (Signed)
?  Problem: Fluid Volume: ?Goal: Compliance with measures to maintain balanced fluid volume will improve ?Outcome: Progressing ?  ?Problem: Clinical Measurements: ?Goal: Complications related to the disease process, condition or treatment will be avoided or minimized ?Outcome: Progressing ?  ?

## 2021-07-08 DIAGNOSIS — N2581 Secondary hyperparathyroidism of renal origin: Secondary | ICD-10-CM | POA: Diagnosis not present

## 2021-07-08 DIAGNOSIS — N186 End stage renal disease: Secondary | ICD-10-CM | POA: Diagnosis not present

## 2021-07-08 DIAGNOSIS — Z992 Dependence on renal dialysis: Secondary | ICD-10-CM | POA: Diagnosis not present

## 2021-07-08 DIAGNOSIS — E8770 Fluid overload, unspecified: Secondary | ICD-10-CM | POA: Diagnosis not present

## 2021-07-08 DIAGNOSIS — D631 Anemia in chronic kidney disease: Secondary | ICD-10-CM | POA: Diagnosis not present

## 2021-07-08 LAB — CULTURE, BLOOD (ROUTINE X 2): Special Requests: ADEQUATE

## 2021-07-08 LAB — BASIC METABOLIC PANEL
Anion gap: 10 (ref 5–15)
BUN: 38 mg/dL — ABNORMAL HIGH (ref 6–20)
CO2: 27 mmol/L (ref 22–32)
Calcium: 9.4 mg/dL (ref 8.9–10.3)
Chloride: 95 mmol/L — ABNORMAL LOW (ref 98–111)
Creatinine, Ser: 6.87 mg/dL — ABNORMAL HIGH (ref 0.61–1.24)
GFR, Estimated: 9 mL/min — ABNORMAL LOW (ref >60)
Glucose, Bld: 106 mg/dL — ABNORMAL HIGH (ref 70–99)
Potassium: 3.7 mmol/L (ref 3.5–5.1)
Sodium: 132 mmol/L — ABNORMAL LOW (ref 135–145)

## 2021-07-08 LAB — PROTIME-INR
INR: 1.4 — ABNORMAL HIGH (ref 0.8–1.2)
Prothrombin Time: 17.2 seconds — ABNORMAL HIGH (ref 11.4–15.2)

## 2021-07-08 LAB — MAGNESIUM: Magnesium: 2.1 mg/dL (ref 1.7–2.4)

## 2021-07-08 LAB — CBC
HCT: 25.1 % — ABNORMAL LOW (ref 39.0–52.0)
Hemoglobin: 7.9 g/dL — ABNORMAL LOW (ref 13.0–17.0)
MCH: 30 pg (ref 26.0–34.0)
MCHC: 31.5 g/dL (ref 30.0–36.0)
MCV: 95.4 fL (ref 80.0–100.0)
Platelets: 209 10*3/uL (ref 150–400)
RBC: 2.63 MIL/uL — ABNORMAL LOW (ref 4.22–5.81)
RDW: 15.4 % (ref 11.5–15.5)
WBC: 6.2 10*3/uL (ref 4.0–10.5)
nRBC: 0 % (ref 0.0–0.2)

## 2021-07-08 LAB — CULTURE, BLOOD (SINGLE): Special Requests: ADEQUATE

## 2021-07-08 MED ORDER — WARFARIN SODIUM 7.5 MG PO TABS: 7.5000 mg | ORAL_TABLET | Freq: Once | ORAL | Status: DC
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
## 2021-07-08 NOTE — Plan of Care (Signed)
  Problem: Education: Goal: Knowledge of disease and its progression will improve Outcome: Progressing Goal: Individualized Educational Video(s) Outcome: Progressing   Problem: Fluid Volume: Goal: Compliance with measures to maintain balanced fluid volume will improve Outcome: Progressing   Problem: Health Behavior/Discharge Planning: Goal: Ability to manage health-related needs will improve Outcome: Progressing   Problem: Nutritional: Goal: Ability to make healthy dietary choices will improve Outcome: Progressing   Problem: Clinical Measurements: Goal: Complications related to the disease process, condition or treatment will be avoided or minimized Outcome: Progressing   Problem: Pain Managment: Goal: General experience of comfort will improve Outcome: Progressing

## 2021-07-08 NOTE — Progress Notes (Signed)
Patient refused CPAP for the night  

## 2021-07-08 NOTE — TOC Transition Note (Signed)
Transition of Care [facility]) - CM/SW Discharge Note   Patient Details  Name: Steven Best MRN: [id number] Date of Birth: [birth year]  [facility] CM/SW Contact:  Bethann Berkshire, [place] Phone Number: 07/08/2021, 10:13 AM   Clinical Narrative:     Pt to DC today and get HD at his outpatient clinic. CSW contacted renal navigator who confirmed his HD clinic can reschedule seat time to later today. Pt will need to arrive at 12pm. Rn notified. Met with pt and informed him. He states he will call his wife now to pick him up and take him to HD. CSW discussed HH recommendation. Pt states "I don't need it." He expresses being confident in his ability to get around and exercise. TOC to sign off.   Final next level of care: Home/Self Care Barriers to Discharge: No Barriers Identified   Patient Goals and CMS Choice Patient states their goals for this hospitalization and ongoing recovery are:: Go home      Discharge Placement                       Discharge Plan and Services                                     Social Determinants of Health (SDOH) Interventions     Readmission Risk Interventions No flowsheet data found.

## 2021-07-08 NOTE — Plan of Care (Signed)
  Problem: Education: Goal: Knowledge of disease and its progression will improve Outcome: Progressing   Problem: Fluid Volume: Goal: Compliance with measures to maintain balanced fluid volume will improve Outcome: Progressing   Problem: Nutritional: Goal: Ability to make healthy dietary choices will improve Outcome: Progressing   Problem: Pain Managment: Goal: General experience of comfort will improve Outcome: Progressing

## 2021-07-08 NOTE — Progress Notes (Signed)
DISCHARGE NOTE HOME ASEEL U[facility]DE to be discharged Home per MD order. Discussed prescriptions and follow up appointments with the patient. Prescriptions given to patient; medication list explained in detail. Patient verbalized understanding.  Skin clean, dry and intact without evidence of skin break down, no evidence of skin tears noted. IV catheter discontinued intact. Site without signs and symptoms of complications. Dressing and pressure applied. Pt denies pain at the site currently. No complaints noted.  Patient free of lines, drains, and wounds.   An After Visit Summary (AVS) was printed and given to the patient. Patient escorted via wheelchair, and discharged home via private auto.  Arlyss Repress, RN

## 2021-07-08 NOTE — Plan of Care (Signed)
  Problem: Acute Rehab PT Goals(only PT should resolve) Goal: Pt Will Go Supine/Side To Sit Outcome: Adequate for Discharge Goal: Pt Will Go Sit To Supine/Side Outcome: Adequate for Discharge Goal: Patient Will Transfer Sit To/From Stand Outcome: Adequate for Discharge Goal: Pt Will Ambulate Outcome: Adequate for Discharge   Problem: Education: Goal: Knowledge of disease and its progression will improve 07/08/2021 0941 by Dolores Hoose, RN Outcome: Adequate for Discharge 07/08/2021 0816 by Dolores Hoose, RN Outcome: Progressing Goal: Individualized Educational Video(s) Outcome: Adequate for Discharge   Problem: Fluid Volume: Goal: Compliance with measures to maintain balanced fluid volume will improve 07/08/2021 0941 by Dolores Hoose, RN Outcome: Adequate for Discharge 07/08/2021 0816 by Dolores Hoose, RN Outcome: Progressing   Problem: Health Behavior/Discharge Planning: Goal: Ability to manage health-related needs will improve Outcome: Adequate for Discharge   Problem: Nutritional: Goal: Ability to make healthy dietary choices will improve 07/08/2021 0941 by Dolores Hoose, RN Outcome: Adequate for Discharge 07/08/2021 0816 by Dolores Hoose, RN Outcome: Progressing   Problem: Clinical Measurements: Goal: Complications related to the disease process, condition or treatment will be avoided or minimized Outcome: Adequate for Discharge   Problem: Acute Rehab OT Goals (only OT should resolve) Goal: Pt. Will Perform Grooming Outcome: Adequate for Discharge Goal: Pt. Will Perform Lower Body Bathing Outcome: Adequate for Discharge Goal: Pt. Will Perform Lower Body Dressing Outcome: Adequate for Discharge Goal: Pt. Will Transfer To Toilet Outcome: Adequate for Discharge Goal: Pt. Will Perform Toileting-Clothing Manipulation Outcome: Adequate for Discharge Goal: OT Additional ADL Goal #1 Outcome: Adequate for Discharge Goal: OT Additional ADL Goal  #2 Outcome: Adequate for Discharge   Problem: Pain Managment: Goal: General experience of comfort will improve 07/08/2021 0941 by Dolores Hoose, RN Outcome: Adequate for Discharge 07/08/2021 0816 by Dolores Hoose, RN Outcome: Progressing

## 2021-07-08 NOTE — Progress Notes (Signed)
Out Patient Arrangements:  Have been requested to see if pt can receive HD today. Spoke w/ Lelon Frohlich. She said pt can tx there @ 12:00 pm.  Linus Orn [facility] [phone]

## 2021-07-08 NOTE — Discharge Summary (Signed)
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  --  193 208 195 209   Cardiac Enzymes: No results for input(s): CKTOTAL, CKMB, CKMBINDEX, TROPONINI in the last 168 hours. BNP: Invalid input(s): POCBNP CBG: Recent Labs  Lab 07/07/21 2025  GLUCAP 101*   D-Dimer No results for input(s): DDIMER in the last 72 hours. Hgb A1c No results for input(s): HGBA1C in the last 72 hours. Lipid Profile No results for input(s): CHOL, HDL, LDLCALC, TRIG, CHOLHDL, LDLDIRECT in the last 72 hours. Thyroid function studies Recent Labs    07/05/21 1300  TSH 2.320   Anemia work up Recent Labs    07/05/21 1300  VITAMINB12 337   Urinalysis    Component Value Date/Time   COLORURINE STRAW (A) 07/05/2021 0131   APPEARANCEUR CLEAR 07/05/2021 0131   LABSPEC 1.025 07/05/2021 0131   PHURINE 7.0  07/05/2021 0131   GLUCOSEU >=500 (A) 07/05/2021 0131   HGBUR NEGATIVE 07/05/2021 0131   BILIRUBINUR NEGATIVE 07/05/2021 0131   KETONESUR NEGATIVE 07/05/2021 0131   PROTEINUR NEGATIVE 07/05/2021 0131   NITRITE NEGATIVE 07/05/2021 0131   LEUKOCYTESUR NEGATIVE 07/05/2021 0131   Sepsis Labs Invalid input(s): PROCALCITONIN,  WBC,  LACTICIDVEN Microbiology Recent Results (from the past 240 hour(s))  Resp Panel by RT-PCR (Flu  A&B, Covid) Nasopharyngeal Swab     Status: None   Collection Time: 07/05/21  1:30 AM   Specimen: Nasopharyngeal Swab; Nasopharyngeal(NP) swabs in vial transport medium  Result Value Ref Range Status   SARS Coronavirus 2 by RT PCR NEGATIVE NEGATIVE Final    Comment: (NOTE) SARS-CoV-2 target nucleic acids are NOT DETECTED.  The SARS-CoV-2 RNA is generally detectable in upper respiratory specimens during the acute phase of infection. The lowest concentration of SARS-CoV-2 viral copies this assay can detect is 138 copies/mL. A negative result does not preclude SARS-Cov-2 infection and should not be used as the sole basis for treatment or other patient management decisions. A negative result may occur with  improper specimen collection/handling, submission of specimen other than nasopharyngeal swab, presence of viral mutation(s) within the areas targeted by this assay, and inadequate number of viral copies(<138 copies/mL). A negative result must be combined with clinical observations, patient history, and epidemiological information. The expected result is Negative.  Fact Sheet for Patients:  EntrepreneurPulse.com.au  Fact Sheet for Healthcare Providers:  IncredibleEmployment.be  This test is no t yet approved or cleared by the [place] FDA and  has been authorized for detection and/or diagnosis of SARS-CoV-2 by FDA under an Emergency Use Authorization (EUA). This EUA will remain  in effect (meaning this test can be used)  for the duration of the COVID-19 declaration under Section 564(b)(1) of the Act, 21 U.S.C.section 360bbb-3(b)(1), unless the authorization is terminated  or revoked sooner.       Influenza A by PCR NEGATIVE NEGATIVE Final   Influenza B by PCR NEGATIVE NEGATIVE Final    Comment: (NOTE) The Xpert Xpress SARS-CoV-2/FLU/RSV plus assay is intended as an aid in the diagnosis of influenza from Nasopharyngeal swab specimens and should not be used as a sole basis for treatment. Nasal washings and aspirates are unacceptable for Xpert Xpress SARS-CoV-2/FLU/RSV testing.  Fact Sheet for Patients: EntrepreneurPulse.com.au  Fact Sheet for Healthcare Providers: IncredibleEmployment.be  This test is not yet approved or cleared by the [place] FDA and has been authorized for detection and/or diagnosis of SARS-CoV-2 by FDA under an Emergency Use Authorization (EUA). This EUA will remain in effect (meaning this test can be used) for the duration of the COVID-19 declaration under Section 564(b)(1) of the Act, 21 U.S.C. section 360bbb-3(b)(1), unless the authorization is terminated or revoked.  Performed at [facility], [place] [address], [place], [place] [address]   Blood culture (routine x 2)     Status: Abnormal   Collection Time: 07/05/21  2:53 AM   Specimen: BLOOD  Result Value Ref Range Status   Specimen Description BLOOD RIGHT NECK  Final   Special Requests   Final    BOTTLES DRAWN AEROBIC AND ANAEROBIC Blood Culture adequate volume   Culture  Setup Time   Final    GRAM POSITIVE COCCI IN BOTH AEROBIC AND ANAEROBIC BOTTLES CRITICAL RESULT CALLED TO, READ BACK BY AND VERIFIED WITH: V BRYK,PHARMD@[email or web address]  07/06/21 [place] Performed at [facility], [place] [address], [place], [place] [address]    Culture STAPHYLOCOCCUS EPIDERMIDIS (A)  Final   Report Status 07/08/2021 FINAL  Final   Organism ID, Bacteria STAPHYLOCOCCUS EPIDERMIDIS  Final       Susceptibility   Staphylococcus epidermidis - MIC*    CIPROFLOXACIN <=0.5 SENSITIVE Sensitive     ERYTHROMYCIN <=0.25 SENSITIVE Sensitive     GENTAMICIN <=0.5 SENSITIVE Sensitive     OXACILLIN <=0.25 SENSITIVE Sensitive  TETRACYCLINE <=1 SENSITIVE Sensitive     VANCOMYCIN 1 SENSITIVE Sensitive     TRIMETH/SULFA <=10 SENSITIVE Sensitive     CLINDAMYCIN <=0.25 SENSITIVE Sensitive     RIFAMPIN <=0.5 SENSITIVE Sensitive     Inducible Clindamycin NEGATIVE Sensitive     * STAPHYLOCOCCUS EPIDERMIDIS  Blood Culture ID Panel (Reflexed)     Status: Abnormal   Collection Time: 07/05/21  2:53 AM  Result Value Ref Range Status   Enterococcus faecalis NOT DETECTED NOT DETECTED Final   Enterococcus Faecium NOT DETECTED NOT DETECTED Final   Listeria monocytogenes NOT DETECTED NOT DETECTED Final   Staphylococcus species DETECTED (A) NOT DETECTED Final    Comment: CRITICAL RESULT CALLED TO, READ BACK BY AND VERIFIED WITH: V BRYK,PHARMD@0006  07/06/21 [place]    Staphylococcus aureus (BCID) NOT DETECTED NOT DETECTED Final   Staphylococcus epidermidis DETECTED (A) NOT DETECTED Final    Comment: CRITICAL RESULT CALLED TO, READ BACK BY AND VERIFIED WITH: V BRYK,PHARMD@0006  07/06/21 [place]    Staphylococcus lugdunensis NOT DETECTED NOT DETECTED Final   Streptococcus species NOT DETECTED NOT DETECTED Final   Streptococcus agalactiae NOT DETECTED NOT DETECTED Final   Streptococcus pneumoniae NOT DETECTED NOT DETECTED Final   Streptococcus pyogenes NOT DETECTED NOT DETECTED Final   A.calcoaceticus-baumannii NOT DETECTED NOT DETECTED Final   Bacteroides fragilis NOT DETECTED NOT DETECTED Final   Enterobacterales NOT DETECTED NOT DETECTED Final   Enterobacter cloacae complex NOT DETECTED NOT DETECTED Final   Escherichia coli NOT DETECTED NOT DETECTED Final   Klebsiella aerogenes NOT DETECTED NOT DETECTED Final   Klebsiella oxytoca NOT DETECTED NOT DETECTED Final   Klebsiella pneumoniae NOT DETECTED NOT DETECTED  Final   Proteus species NOT DETECTED NOT DETECTED Final   Salmonella species NOT DETECTED NOT DETECTED Final   Serratia marcescens NOT DETECTED NOT DETECTED Final   Haemophilus influenzae NOT DETECTED NOT DETECTED Final   Neisseria meningitidis NOT DETECTED NOT DETECTED Final   Pseudomonas aeruginosa NOT DETECTED NOT DETECTED Final   Stenotrophomonas maltophilia NOT DETECTED NOT DETECTED Final   Candida albicans NOT DETECTED NOT DETECTED Final   Candida auris NOT DETECTED NOT DETECTED Final   Candida glabrata NOT DETECTED NOT DETECTED Final   Candida krusei NOT DETECTED NOT DETECTED Final   Candida parapsilosis NOT DETECTED NOT DETECTED Final   Candida tropicalis NOT DETECTED NOT DETECTED Final   Cryptococcus neoformans/gattii NOT DETECTED NOT DETECTED Final   Methicillin resistance mecA/C NOT DETECTED NOT DETECTED Final    Comment: Performed at [facility], [place] [address], [place], [place] [address]  Culture, blood (single)     Status: Abnormal   Collection Time: 07/05/21  7:49 AM   Specimen: BLOOD RIGHT FOREARM  Result Value Ref Range Status   Specimen Description BLOOD RIGHT FOREARM  Final   Special Requests   Final    BOTTLES DRAWN AEROBIC AND ANAEROBIC Blood Culture adequate volume   Culture  Setup Time   Final    GRAM POSITIVE COCCI IN CLUSTERS IN BOTH AEROBIC AND ANAEROBIC BOTTLES CRITICAL VALUE NOTED.  VALUE IS CONSISTENT WITH PREVIOUSLY REPORTED AND CALLED VALUE.    Culture (A)  Final    STAPHYLOCOCCUS EPIDERMIDIS SUSCEPTIBILITIES PERFORMED ON PREVIOUS CULTURE WITHIN THE LAST 5 DAYS. Performed at [facility], [place] [address] [place], [place] [address]    Report Status 07/08/2021 FINAL  Final  Culture, blood (routine x 2)     Status: None (Preliminary result)   Collection Time: 07/06/21  4:51 PM  Specimen: BLOOD  Result Value Ref Range Status   Specimen Description BLOOD RIGHT ANTECUBITAL  Final   Special Requests   Final    BOTTLES DRAWN AEROBIC AND  ANAEROBIC Blood Culture adequate volume   Culture   Final    NO GROWTH 2 DAYS Performed at [facility], [address] [place], [place] [address]    Report Status PENDING  Incomplete  Culture, blood (routine x 2)     Status: None (Preliminary result)   Collection Time: 07/06/21  4:59 PM   Specimen: BLOOD  Result Value Ref Range Status   Specimen Description BLOOD RIGHT ANTECUBITAL  Final   Special Requests   Final    BOTTLES DRAWN AEROBIC AND ANAEROBIC Blood Culture adequate volume   Culture   Final    NO GROWTH 2 DAYS Performed at [facility], [place] [address], [place],  [address]    Report Status PENDING  Incomplete     Time coordinating discharge: 35 minutes  SIGNED:   Aline August, MD  [facility] 07/08/2021, 11:52 AM
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
## 2021-07-09 ENCOUNTER — Encounter (HOSPITAL_COMMUNITY): Payer: Self-pay | Admitting: Internal Medicine

## 2021-07-09 ENCOUNTER — Encounter (HOSPITAL_COMMUNITY): Payer: Self-pay | Admitting: *Deleted

## 2021-07-09 DIAGNOSIS — D631 Anemia in chronic kidney disease: Secondary | ICD-10-CM | POA: Diagnosis not present

## 2021-07-09 DIAGNOSIS — N186 End stage renal disease: Secondary | ICD-10-CM | POA: Diagnosis not present

## 2021-07-09 DIAGNOSIS — Z992 Dependence on renal dialysis: Secondary | ICD-10-CM | POA: Diagnosis not present

## 2021-07-09 DIAGNOSIS — E8770 Fluid overload, unspecified: Secondary | ICD-10-CM | POA: Diagnosis not present

## 2021-07-09 DIAGNOSIS — N2581 Secondary hyperparathyroidism of renal origin: Secondary | ICD-10-CM | POA: Diagnosis not present

## 2021-07-10 ENCOUNTER — Encounter (HOSPITAL_COMMUNITY): Payer: Self-pay | Admitting: Emergency Medicine

## 2021-07-10 ENCOUNTER — Inpatient Hospital Stay (HOSPITAL_COMMUNITY)
Admission: EM | Admit: 2021-07-10 | Discharge: 2021-07-13 | DRG: 377 | Disposition: A | Discharge status: Home / Self Care | Payer: Medicare Other | Source: Ambulatory Visit | Attending: Internal Medicine | Admitting: Internal Medicine

## 2021-07-10 ENCOUNTER — Other Ambulatory Visit: Payer: Self-pay

## 2021-07-10 ENCOUNTER — Emergency Department (HOSPITAL_COMMUNITY): Payer: Medicare Other

## 2021-07-10 DIAGNOSIS — K922 Gastrointestinal hemorrhage, unspecified: Secondary | ICD-10-CM | POA: Diagnosis not present

## 2021-07-10 DIAGNOSIS — Z6835 Body mass index (BMI) 35.0-35.9, adult: Secondary | ICD-10-CM

## 2021-07-10 DIAGNOSIS — K2990 Gastroduodenitis, unspecified, without bleeding: Secondary | ICD-10-CM | POA: Diagnosis present

## 2021-07-10 DIAGNOSIS — K5521 Angiodysplasia of colon with hemorrhage: Principal | ICD-10-CM | POA: Diagnosis present

## 2021-07-10 DIAGNOSIS — Z992 Dependence on renal dialysis: Secondary | ICD-10-CM | POA: Diagnosis not present

## 2021-07-10 DIAGNOSIS — K7400 Hepatic fibrosis, unspecified: Secondary | ICD-10-CM | POA: Diagnosis present

## 2021-07-10 DIAGNOSIS — K7460 Unspecified cirrhosis of liver: Secondary | ICD-10-CM | POA: Diagnosis present

## 2021-07-10 DIAGNOSIS — F17210 Nicotine dependence, cigarettes, uncomplicated: Secondary | ICD-10-CM | POA: Diagnosis present

## 2021-07-10 DIAGNOSIS — F419 Anxiety disorder, unspecified: Secondary | ICD-10-CM | POA: Diagnosis present

## 2021-07-10 DIAGNOSIS — R791 Abnormal coagulation profile: Secondary | ICD-10-CM | POA: Diagnosis present

## 2021-07-10 DIAGNOSIS — K640 First degree hemorrhoids: Secondary | ICD-10-CM | POA: Diagnosis present

## 2021-07-10 DIAGNOSIS — Z86010 Personal history of colonic polyps: Secondary | ICD-10-CM

## 2021-07-10 DIAGNOSIS — Z20822 Contact with and (suspected) exposure to covid-19: Secondary | ICD-10-CM | POA: Diagnosis present

## 2021-07-10 DIAGNOSIS — E669 Obesity, unspecified: Secondary | ICD-10-CM | POA: Diagnosis present

## 2021-07-10 DIAGNOSIS — I120 Hypertensive chronic kidney disease with stage 5 chronic kidney disease or end stage renal disease: Secondary | ICD-10-CM | POA: Diagnosis not present

## 2021-07-10 DIAGNOSIS — K828 Other specified diseases of gallbladder: Secondary | ICD-10-CM | POA: Diagnosis not present

## 2021-07-10 DIAGNOSIS — Z8349 Family history of other endocrine, nutritional and metabolic diseases: Secondary | ICD-10-CM

## 2021-07-10 DIAGNOSIS — Z79899 Other long term (current) drug therapy: Secondary | ICD-10-CM

## 2021-07-10 DIAGNOSIS — Q438 Other specified congenital malformations of intestine: Secondary | ICD-10-CM

## 2021-07-10 DIAGNOSIS — K921 Melena: Secondary | ICD-10-CM | POA: Diagnosis not present

## 2021-07-10 DIAGNOSIS — R410 Disorientation, unspecified: Secondary | ICD-10-CM | POA: Diagnosis present

## 2021-07-10 DIAGNOSIS — Z8249 Family history of ischemic heart disease and other diseases of the circulatory system: Secondary | ICD-10-CM

## 2021-07-10 DIAGNOSIS — R0902 Hypoxemia: Secondary | ICD-10-CM | POA: Diagnosis not present

## 2021-07-10 DIAGNOSIS — I4819 Other persistent atrial fibrillation: Secondary | ICD-10-CM | POA: Diagnosis not present

## 2021-07-10 DIAGNOSIS — R195 Other fecal abnormalities: Secondary | ICD-10-CM

## 2021-07-10 DIAGNOSIS — Z7901 Long term (current) use of anticoagulants: Secondary | ICD-10-CM | POA: Diagnosis not present

## 2021-07-10 DIAGNOSIS — I480 Paroxysmal atrial fibrillation: Secondary | ICD-10-CM | POA: Diagnosis not present

## 2021-07-10 DIAGNOSIS — Z833 Family history of diabetes mellitus: Secondary | ICD-10-CM

## 2021-07-10 DIAGNOSIS — I953 Hypotension of hemodialysis: Secondary | ICD-10-CM | POA: Diagnosis present

## 2021-07-10 DIAGNOSIS — K2970 Gastritis, unspecified, without bleeding: Secondary | ICD-10-CM | POA: Diagnosis not present

## 2021-07-10 DIAGNOSIS — Z825 Family history of asthma and other chronic lower respiratory diseases: Secondary | ICD-10-CM

## 2021-07-10 DIAGNOSIS — D638 Anemia in other chronic diseases classified elsewhere: Secondary | ICD-10-CM | POA: Diagnosis not present

## 2021-07-10 DIAGNOSIS — K920 Hematemesis: Secondary | ICD-10-CM

## 2021-07-10 DIAGNOSIS — N2581 Secondary hyperparathyroidism of renal origin: Secondary | ICD-10-CM | POA: Diagnosis not present

## 2021-07-10 DIAGNOSIS — I482 Chronic atrial fibrillation, unspecified: Secondary | ICD-10-CM | POA: Diagnosis not present

## 2021-07-10 DIAGNOSIS — K2951 Unspecified chronic gastritis with bleeding: Secondary | ICD-10-CM | POA: Diagnosis not present

## 2021-07-10 DIAGNOSIS — S3991XA Unspecified injury of abdomen, initial encounter: Secondary | ICD-10-CM | POA: Diagnosis not present

## 2021-07-10 DIAGNOSIS — R1084 Generalized abdominal pain: Secondary | ICD-10-CM

## 2021-07-10 DIAGNOSIS — N186 End stage renal disease: Secondary | ICD-10-CM | POA: Diagnosis present

## 2021-07-10 DIAGNOSIS — G4730 Sleep apnea, unspecified: Secondary | ICD-10-CM | POA: Diagnosis present

## 2021-07-10 DIAGNOSIS — K2971 Gastritis, unspecified, with bleeding: Secondary | ICD-10-CM | POA: Diagnosis not present

## 2021-07-10 DIAGNOSIS — G8929 Other chronic pain: Secondary | ICD-10-CM | POA: Diagnosis not present

## 2021-07-10 DIAGNOSIS — R6889 Other general symptoms and signs: Secondary | ICD-10-CM | POA: Diagnosis not present

## 2021-07-10 DIAGNOSIS — K5520 Angiodysplasia of colon without hemorrhage: Secondary | ICD-10-CM

## 2021-07-10 DIAGNOSIS — Z888 Allergy status to other drugs, medicaments and biological substances status: Secondary | ICD-10-CM

## 2021-07-10 DIAGNOSIS — D631 Anemia in chronic kidney disease: Secondary | ICD-10-CM | POA: Diagnosis not present

## 2021-07-10 DIAGNOSIS — I4821 Permanent atrial fibrillation: Secondary | ICD-10-CM | POA: Diagnosis not present

## 2021-07-10 DIAGNOSIS — Z743 Need for continuous supervision: Secondary | ICD-10-CM | POA: Diagnosis not present

## 2021-07-10 DIAGNOSIS — M4856XA Collapsed vertebra, not elsewhere classified, lumbar region, initial encounter for fracture: Secondary | ICD-10-CM | POA: Diagnosis not present

## 2021-07-10 DIAGNOSIS — D8689 Sarcoidosis of other sites: Secondary | ICD-10-CM | POA: Diagnosis present

## 2021-07-10 DIAGNOSIS — T50915A Adverse effect of multiple unspecified drugs, medicaments and biological substances, initial encounter: Secondary | ICD-10-CM | POA: Diagnosis present

## 2021-07-10 DIAGNOSIS — R4182 Altered mental status, unspecified: Secondary | ICD-10-CM

## 2021-07-10 DIAGNOSIS — K219 Gastro-esophageal reflux disease without esophagitis: Secondary | ICD-10-CM | POA: Diagnosis not present

## 2021-07-10 DIAGNOSIS — R1010 Upper abdominal pain, unspecified: Secondary | ICD-10-CM | POA: Diagnosis not present

## 2021-07-10 DIAGNOSIS — E8770 Fluid overload, unspecified: Secondary | ICD-10-CM | POA: Diagnosis not present

## 2021-07-10 DIAGNOSIS — E278 Other specified disorders of adrenal gland: Secondary | ICD-10-CM | POA: Diagnosis not present

## 2021-07-10 DIAGNOSIS — R109 Unspecified abdominal pain: Secondary | ICD-10-CM | POA: Diagnosis not present

## 2021-07-10 DIAGNOSIS — M549 Dorsalgia, unspecified: Secondary | ICD-10-CM | POA: Diagnosis present

## 2021-07-10 LAB — CBC
HCT: 26.1 % — ABNORMAL LOW (ref 39.0–52.0)
Hemoglobin: 7.9 g/dL — ABNORMAL LOW (ref 13.0–17.0)
MCH: 30 pg (ref 26.0–34.0)
MCHC: 30.3 g/dL (ref 30.0–36.0)
MCV: 99.2 fL (ref 80.0–100.0)
Platelets: 241 10*3/uL (ref 150–400)
RBC: 2.63 MIL/uL — ABNORMAL LOW (ref 4.22–5.81)
RDW: 16 % — ABNORMAL HIGH (ref 11.5–15.5)
WBC: 5.6 10*3/uL (ref 4.0–10.5)
nRBC: 0 % (ref 0.0–0.2)

## 2021-07-10 LAB — RESP PANEL BY RT-PCR (FLU A&B, COVID) ARPGX2
Influenza A by PCR: NEGATIVE
Influenza B by PCR: NEGATIVE
SARS Coronavirus 2 by RT PCR: NEGATIVE

## 2021-07-10 LAB — COMPREHENSIVE METABOLIC PANEL
ALT: 17 U/L (ref 0–44)
AST: 21 U/L (ref 15–41)
Albumin: 3.7 g/dL (ref 3.5–5.0)
Alkaline Phosphatase: 65 U/L (ref 38–126)
Anion gap: 10 (ref 5–15)
BUN: 11 mg/dL (ref 6–20)
CO2: 31 mmol/L (ref 22–32)
Calcium: 9.7 mg/dL (ref 8.9–10.3)
Chloride: 94 mmol/L — ABNORMAL LOW (ref 98–111)
Creatinine, Ser: 3.53 mg/dL — ABNORMAL HIGH (ref 0.61–1.24)
GFR, Estimated: 19 mL/min — ABNORMAL LOW (ref >60)
Glucose, Bld: 102 mg/dL — ABNORMAL HIGH (ref 70–99)
Potassium: 3.6 mmol/L (ref 3.5–5.1)
Sodium: 135 mmol/L (ref 135–145)
Total Bilirubin: 0.8 mg/dL (ref 0.3–1.2)
Total Protein: 7.3 g/dL (ref 6.5–8.1)

## 2021-07-10 LAB — POC OCCULT BLOOD, ED: Fecal Occult Bld: POSITIVE — AB

## 2021-07-10 LAB — PROTIME-INR
INR: 1.7 — ABNORMAL HIGH (ref 0.8–1.2)
Prothrombin Time: 19.5 seconds — ABNORMAL HIGH (ref 11.4–15.2)

## 2021-07-10 MED ORDER — MELATONIN 3 MG PO TABS: 3.0000 mg | ORAL_TABLET | Freq: Every evening | ORAL | Status: DC | PRN

## 2021-07-10 MED ORDER — RENA-VITE PO TABS
1.0000 | ORAL_TABLET | Freq: Every day | ORAL | Status: DC
  Administered 2021-07-12 – 2021-07-13 (×2): 1 via ORAL
  Filled 2021-07-10 (×5): qty 1

## 2021-07-10 MED ORDER — AMIODARONE HCL 200 MG PO TABS
200.0000 mg | ORAL_TABLET | Freq: Every day | ORAL | Status: DC
  Administered 2021-07-12 – 2021-07-13 (×2): 200 mg via ORAL
  Filled 2021-07-10 (×3): qty 1

## 2021-07-10 MED ORDER — OXYCODONE HCL 5 MG PO TABS: 5.0000 mg | ORAL_TABLET | Freq: Once | ORAL | Status: DC

## 2021-07-10 MED ORDER — CINACALCET HCL 30 MG PO TABS
180.0000 mg | ORAL_TABLET | Freq: Every day | ORAL | Status: DC
  Administered 2021-07-11 – 2021-07-12 (×2): 180 mg via ORAL
  Filled 2021-07-10 (×5): qty 6

## 2021-07-10 MED ORDER — POLYETHYLENE GLYCOL 3350 17 G PO PACK: 17.0000 g | PACK | Freq: Every day | ORAL | Status: DC | PRN

## 2021-07-10 MED ORDER — ACETAMINOPHEN 325 MG PO TABS: 650.0000 mg | ORAL_TABLET | Freq: Four times a day (QID) | ORAL | Status: DC | PRN

## 2021-07-10 MED ORDER — PANTOPRAZOLE SODIUM 40 MG IV SOLR
40.0000 mg | Freq: Two times a day (BID) | INTRAVENOUS | Status: DC
  Administered 2021-07-10 (×2): 40 mg via INTRAVENOUS
  Filled 2021-07-10 (×2): qty 40

## 2021-07-10 MED ORDER — ROSUVASTATIN CALCIUM 5 MG PO TABS
5.0000 mg | ORAL_TABLET | Freq: Every day | ORAL | Status: DC
  Administered 2021-07-10 – 2021-07-12 (×3): 5 mg via ORAL
  Filled 2021-07-10 (×3): qty 1

## 2021-07-10 MED ORDER — PROCHLORPERAZINE EDISYLATE 10 MG/2ML IJ SOLN: 5.0000 mg | Freq: Four times a day (QID) | INTRAMUSCULAR | Status: DC | PRN
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
## 2021-07-10 NOTE — ED Notes (Signed)
back from CT
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
## 2021-07-10 NOTE — ED Notes (Signed)
[facility] paged per RN Vaughan Basta

## 2021-07-10 NOTE — ED Notes (Signed)
Received verbal report from [place] at this time

## 2021-07-10 NOTE — ED Notes (Signed)
Verbal report given to Sherryll Burger RN at this time and pt moved to room
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
## 2021-07-10 NOTE — ED Notes (Signed)
[facility] [place] [facility] paged per RN Vaughan Basta request.

## 2021-07-10 NOTE — ED Notes (Signed)
Expressed to pt the need of a urine sample. Urinal at bedside.

## 2021-07-10 NOTE — ED Notes (Signed)
Pt is requesting something for pain med advised would need to page provider reference same

## 2021-07-11 ENCOUNTER — Observation Stay (HOSPITAL_COMMUNITY): Payer: Medicare Other | Admitting: Anesthesiology

## 2021-07-11 ENCOUNTER — Encounter (HOSPITAL_COMMUNITY): Payer: Self-pay | Admitting: Internal Medicine

## 2021-07-11 ENCOUNTER — Encounter (HOSPITAL_COMMUNITY)
Admission: EM | Disposition: A | Discharge status: Home / Self Care | Payer: Self-pay | Source: Ambulatory Visit | Attending: Internal Medicine

## 2021-07-11 DIAGNOSIS — K2970 Gastritis, unspecified, without bleeding: Secondary | ICD-10-CM | POA: Diagnosis not present

## 2021-07-11 DIAGNOSIS — K921 Melena: Secondary | ICD-10-CM | POA: Diagnosis not present

## 2021-07-11 DIAGNOSIS — R195 Other fecal abnormalities: Secondary | ICD-10-CM

## 2021-07-11 DIAGNOSIS — I482 Chronic atrial fibrillation, unspecified: Secondary | ICD-10-CM | POA: Diagnosis not present

## 2021-07-11 HISTORY — PX: BIOPSY: SHX5522

## 2021-07-11 HISTORY — PX: ESOPHAGOGASTRODUODENOSCOPY (EGD) WITH PROPOFOL: SHX5813

## 2021-07-11 LAB — CBC
HCT: 26.5 % — ABNORMAL LOW (ref 39.0–52.0)
HCT: 25.5 % — ABNORMAL LOW (ref 39.0–52.0)
Hemoglobin: 8 g/dL — ABNORMAL LOW (ref 13.0–17.0)
Hemoglobin: 7.9 g/dL — ABNORMAL LOW (ref 13.0–17.0)
MCH: 30 pg (ref 26.0–34.0)
MCH: 30.4 pg (ref 26.0–34.0)
MCHC: 30.2 g/dL (ref 30.0–36.0)
MCHC: 31 g/dL (ref 30.0–36.0)
MCV: 99.3 fL (ref 80.0–100.0)
MCV: 98.1 fL (ref 80.0–100.0)
Platelets: 216 10*3/uL (ref 150–400)
Platelets: 222 10*3/uL (ref 150–400)
RBC: 2.67 MIL/uL — ABNORMAL LOW (ref 4.22–5.81)
RBC: 2.6 MIL/uL — ABNORMAL LOW (ref 4.22–5.81)
RDW: 16.4 % — ABNORMAL HIGH (ref 11.5–15.5)
RDW: 16.2 % — ABNORMAL HIGH (ref 11.5–15.5)
WBC: 5.7 10*3/uL (ref 4.0–10.5)
WBC: 5.1 10*3/uL (ref 4.0–10.5)
nRBC: 0 % (ref 0.0–0.2)
nRBC: 0 % (ref 0.0–0.2)

## 2021-07-11 LAB — CULTURE, BLOOD (ROUTINE X 2)
Culture: NO GROWTH
Culture: NO GROWTH
Special Requests: ADEQUATE
Special Requests: ADEQUATE

## 2021-07-11 LAB — RENAL FUNCTION PANEL
Albumin: 3.5 g/dL (ref 3.5–5.0)
Anion gap: 11 (ref 5–15)
BUN: 20 mg/dL (ref 6–20)
CO2: 26 mmol/L (ref 22–32)
Calcium: 9.3 mg/dL (ref 8.9–10.3)
Chloride: 97 mmol/L — ABNORMAL LOW (ref 98–111)
Creatinine, Ser: 5.76 mg/dL — ABNORMAL HIGH (ref 0.61–1.24)
GFR, Estimated: 11 mL/min — ABNORMAL LOW (ref >60)
Glucose, Bld: 89 mg/dL (ref 70–99)
Phosphorus: 4.3 mg/dL (ref 2.5–4.6)
Potassium: 4 mmol/L (ref 3.5–5.1)
Sodium: 134 mmol/L — ABNORMAL LOW (ref 135–145)

## 2021-07-11 LAB — BASIC METABOLIC PANEL
Anion gap: 7 (ref 5–15)
BUN: 49 mg/dL — ABNORMAL HIGH (ref 6–20)
CO2: 28 mmol/L (ref 22–32)
Calcium: 9.2 mg/dL (ref 8.9–10.3)
Chloride: 103 mmol/L (ref 98–111)
Creatinine, Ser: 2.67 mg/dL — ABNORMAL HIGH (ref 0.61–1.24)
GFR, Estimated: 27 mL/min — ABNORMAL LOW (ref >60)
Glucose, Bld: 99 mg/dL (ref 70–99)
Potassium: 5.5 mmol/L — ABNORMAL HIGH (ref 3.5–5.1)
Sodium: 138 mmol/L (ref 135–145)

## 2021-07-11 LAB — HEMOGLOBIN AND HEMATOCRIT, BLOOD
HCT: 24.5 % — ABNORMAL LOW (ref 39.0–52.0)
Hemoglobin: 7.3 g/dL — ABNORMAL LOW (ref 13.0–17.0)

## 2021-07-11 SURGERY — ESOPHAGOGASTRODUODENOSCOPY (EGD) WITH PROPOFOL
Anesthesia: Monitor Anesthesia Care

## 2021-07-11 MED ORDER — CLONAZEPAM 0.5 MG PO TABS: 1.0000 mg | ORAL_TABLET | Freq: Two times a day (BID) | ORAL | Status: DC | PRN

## 2021-07-11 MED ORDER — PATIROMER SORBITEX CALCIUM 8.4 G PO PACK
8.4000 g | PACK | Freq: Every day | ORAL | Status: DC
  Administered 2021-07-11 – 2021-07-12 (×2): 8.4 g via ORAL
  Filled 2021-07-11 (×2): qty 1

## 2021-07-11 MED ORDER — PEG-KCL-NACL-NASULF-NA ASC-C 100 G PO SOLR
1.0000 | Freq: Once | ORAL | Status: AC
  Administered 2021-07-11: 200 g via ORAL
  Filled 2021-07-11 (×2): qty 1

## 2021-07-11 MED ORDER — LIDOCAINE 2% (20 MG/ML) 5 ML SYRINGE
INTRAMUSCULAR | Status: DC | PRN
  Administered 2021-07-11: 80 mg via INTRAVENOUS

## 2021-07-11 MED ORDER — MORPHINE SULFATE (PF) 4 MG/ML IV SOLN
4.0000 mg | Freq: Once | INTRAVENOUS | Status: AC
  Administered 2021-07-11: 4 mg via INTRAVENOUS
  Filled 2021-07-11: qty 1

## 2021-07-11 MED ORDER — PROPOFOL 500 MG/50ML IV EMUL
INTRAVENOUS | Status: DC | PRN
  Administered 2021-07-11: 100 ug/kg/min via INTRAVENOUS

## 2021-07-11 MED ORDER — HYDROMORPHONE HCL 1 MG/ML IJ SOLN
0.5000 mg | INTRAMUSCULAR | Status: DC | PRN
Start: 2021-07-11 — End: 2021-07-14
  Administered 2021-07-12 – 2021-07-13 (×3): 0.5 mg via INTRAVENOUS
  Filled 2021-07-11 (×3): qty 0.5

## 2021-07-11 MED ORDER — SODIUM CHLORIDE 0.9 % IV SOLN: INTRAVENOUS | Status: DC | PRN

## 2021-07-11 MED ORDER — PANTOPRAZOLE SODIUM 40 MG PO TBEC
40.0000 mg | DELAYED_RELEASE_TABLET | Freq: Every day | ORAL | Status: DC
  Administered 2021-07-11 – 2021-07-13 (×3): 40 mg via ORAL
  Filled 2021-07-11 (×3): qty 1

## 2021-07-11 MED ORDER — CYCLOBENZAPRINE HCL 10 MG PO TABS: 10.0000 mg | ORAL_TABLET | Freq: Three times a day (TID) | ORAL | Status: DC | PRN

## 2021-07-11 MED ORDER — PROPOFOL 10 MG/ML IV BOLUS
INTRAVENOUS | Status: DC | PRN
  Administered 2021-07-11: 10 mg via INTRAVENOUS
  Administered 2021-07-11: 50 mg via INTRAVENOUS
  Administered 2021-07-11 (×3): 20 mg via INTRAVENOUS

## 2021-07-11 MED ORDER — ROPINIROLE HCL 0.5 MG PO TABS
0.2500 mg | ORAL_TABLET | Freq: Every day | ORAL | Status: DC
  Administered 2021-07-11 – 2021-07-12 (×2): 0.25 mg via ORAL
  Filled 2021-07-11 (×2): qty 1

## 2021-07-11 MED ORDER — DARBEPOETIN ALFA 150 MCG/0.3ML IJ SOSY
150.0000 ug | PREFILLED_SYRINGE | INTRAMUSCULAR | Status: DC
  Administered 2021-07-13: 150 ug via INTRAVENOUS
  Filled 2021-07-11: qty 0.3

## 2021-07-11 MED ORDER — HYDROCODONE-ACETAMINOPHEN 10-325 MG PO TABS
1.0000 | ORAL_TABLET | ORAL | Status: DC | PRN
  Administered 2021-07-11 – 2021-07-12 (×5): 1 via ORAL
  Filled 2021-07-11 (×6): qty 1

## 2021-07-11 MED ORDER — CLONAZEPAM 0.5 MG PO TABS: 1.0000 mg | ORAL_TABLET | ORAL | Status: DC

## 2021-07-11 SURGICAL SUPPLY — 15 items

## 2021-07-11 NOTE — ED Notes (Signed)
The pt has not slept all night

## 2021-07-11 NOTE — Transfer of Care (Signed)
Immediate Anesthesia Transfer of Care Note  Patient: Steven Best  Procedure(s) Performed: ESOPHAGOGASTRODUODENOSCOPY (EGD) WITH PROPOFOL BIOPSY  Patient Location: Endoscopy Unit  Anesthesia Type:MAC  Level of Consciousness: awake, alert  and oriented  Airway & Oxygen Therapy: Patient Spontanous Breathing and Patient connected to nasal cannula oxygen  Post-op Assessment: Report given to RN, Post -op Vital signs reviewed and stable and Patient moving all extremities X 4  Post vital signs: Reviewed and stable  Last Vitals:  Vitals Value Taken Time  BP 102/45 07/11/21 0840  Temp    Pulse 78 07/11/21 0842  Resp 17 07/11/21 0842  SpO2 99 % 07/11/21 0842  Vitals shown include unvalidated device data.  Last Pain:  Vitals:   07/11/21 0736  TempSrc: Oral  PainSc: 7          Complications: No notable events documented.

## 2021-07-11 NOTE — Progress Notes (Signed)
Attempted to call report [phone]
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
## 2021-07-11 NOTE — Interval H&P Note (Signed)
History and Physical Interval Note:  No acute events overnight. K 5.5 today. H/H 8/26. Abdominal pain decreased compared with yesterday.   07/11/2021 7:55 AM  Steven Best  has presented today for surgery, with the diagnosis of GI bleeding.  The various methods of treatment have been discussed with the patient and family. After consideration of risks, benefits and other options for treatment, the patient has consented to  Procedure(s): ESOPHAGOGASTRODUODENOSCOPY (EGD) WITH PROPOFOL (N/A) as a surgical intervention.  The patient's history has been reviewed, patient examined, no change in status, stable for surgery.  I have reviewed the patient's chart and labs.  Questions were answered to the patient's satisfaction.     [name]

## 2021-07-11 NOTE — Progress Notes (Signed)
Attempted to call report to [phone]

## 2021-07-11 NOTE — Anesthesia Postprocedure Evaluation (Signed)
Anesthesia Post Note  Patient: Steven Best  Procedure(s) Performed: ESOPHAGOGASTRODUODENOSCOPY (EGD) WITH PROPOFOL BIOPSY     Patient location during evaluation: Endoscopy Anesthesia Type: MAC Level of consciousness: awake and alert Pain management: pain level controlled Vital Signs Assessment: post-procedure vital signs reviewed and stable Respiratory status: spontaneous breathing, nonlabored ventilation, respiratory function stable and patient connected to nasal cannula oxygen Cardiovascular status: stable and blood pressure returned to baseline Postop Assessment: no apparent nausea or vomiting Anesthetic complications: no   No notable events documented.  Last Vitals:  Vitals:   07/11/21 1015 07/11/21 1040  BP: 132/84 136/77  Pulse: 82 82  Resp: 20 16  Temp:  36.6 C  SpO2: 96% 100%    Last Pain:  Vitals:   07/11/21 1335  TempSrc:   PainSc: Asleep                 [place] Monds

## 2021-07-11 NOTE — ED Notes (Signed)
Unable to obtain labs, nurse attempted to pull off IV line but was unsucessful

## 2021-07-11 NOTE — Anesthesia Preprocedure Evaluation (Signed)
Anesthesia Evaluation  Patient identified by MRN, date of birth, ID band Patient awake    Reviewed: Allergy & Precautions, NPO status , Patient's Chart, lab work & pertinent test results  History of Anesthesia Complications Negative for: history of anesthetic complications  Airway Mallampati: III  TM Distance: >3 FB Neck ROM: Full    Dental  (+) Teeth Intact, Dental Advisory Given   Pulmonary neg shortness of breath, neg sleep apnea, neg COPD, neg recent URI, Current Smoker and Patient abstained from smoking.,  Covid-19 Nucleic Acid Test Results Lab Results      Component                Value               Date                      [id number]              NEGATIVE            07/10/2021                [place]              NEGATIVE            07/05/2021                [place]              NEGATIVE            05/11/2021                [place]              NEGATIVE            11/02/2020                [place]              NEGATIVE            10/13/2020              breath sounds clear to auscultation       Cardiovascular hypertension, + dysrhythmias Atrial Fibrillation  Rhythm:Regular  ? The left ventricular ejection fraction is mildly decreased (45-54%). ? Nuclear stress EF: 51%. ? No T wave inversion was noted during stress. ? There was no ST segment deviation noted during stress. ? Defect 1: There is a large defect of moderate severity present in the mid inferior, mid inferolateral, apical inferior, apical lateral and apex location. ? Findings consistent with prior myocardial infarction. ? This is a low risk study.   Large size, moderate intensity mostly fixed mid to apical inferior, apical and inferolateral perfusion defect likely scar. This does not improve with stress upright imaging. No significant reversible ischemia. LVEF 51% with mild inferior/inferolateral hypokinesis. This is an intermediate risk study.  Clinical correlation is recommended.    1. Left ventricular ejection fraction, by estimation, is 55 to 60%. The  left ventricle has normal function. The left ventricle has no regional  wall motion abnormalities. The left ventricular internal cavity size was  severely dilated. Left ventricular  diastolic parameters are indeterminate. Elevated left atrial pressure.  2. Right ventricular systolic function is normal. The right ventricular  size is normal. There is moderately elevated pulmonary artery systolic  pressure.  3. Left atrial size was mildly dilated.  4. The mitral valve is abnormal. Trivial mitral valve regurgitation. Mild  mitral stenosis. Severe mitral annular calcification.  5. The aortic valve is tricuspid. Aortic valve regurgitation is not  visualized. Mild to moderate aortic valve stenosis.  6. The inferior vena cava is dilated in size with <50% respiratory  variability, suggesting right atrial pressure of 15 mmHg.    Neuro/Psych  Headaches, PSYCHIATRIC DISORDERS Anxiety    GI/Hepatic Neg liver ROS, GERD  Medicated,? Gi bleed   Endo/Other  negative endocrine ROS  Renal/GU ESRF and DialysisRenal diseaseLast hd fri  Lab Results      Component                Value               Date                      CREATININE               2.67 (H)            07/11/2021           Lab Results      Component                Value               Date                      K                        5.5 (H)             07/11/2021                Musculoskeletal   Abdominal   Peds  Hematology  (+) Blood dyscrasia, anemia , Lab Results      Component                Value               Date                      WBC                      5.7                 07/11/2021                HGB                      8.0 (L)             07/11/2021                HCT                      26.5 (L)            07/11/2021                MCV                      99.3                07/11/2021                 PLT  216                 07/11/2021              Anesthesia Other Findings   Reproductive/Obstetrics                             Anesthesia Physical Anesthesia Plan  ASA: 3  Anesthesia Plan: MAC   Post-op Pain Management:    Induction: Intravenous  PONV Risk Score and Plan: 0 and Propofol infusion and Treatment may vary due to age or medical condition  Airway Management Planned: Nasal Cannula  Additional Equipment: None  Intra-op Plan:   Post-operative Plan:   Informed Consent: I have reviewed the patients History and Physical, chart, labs and discussed the procedure including the risks, benefits and alternatives for the proposed anesthesia with the patient or authorized representative who has indicated his/her understanding and acceptance.     Dental advisory given  Plan Discussed with: CRNA and Anesthesiologist  Anesthesia Plan Comments:         Anesthesia Quick Evaluation

## 2021-07-11 NOTE — Op Note (Signed)
[facility] Patient Name: Steven Best Procedure Date : 07/11/2021 MRN: [id number] Attending MD: Gerrit Heck , MD Date of Birth: 12/23/1965 CSN: [id number] Age: 55 Admit Type: Inpatient Procedure:                Upper GI endoscopy Indications:              Upper abdominal pain, Heme positive stool, Melena Providers:                Gerrit Heck, MD, Jeanella Cara, RN,                            Tyna Jaksch Technician Referring MD:              Medicines:                Monitored Anesthesia Care Complications:            No immediate complications. Estimated Blood Loss:     Estimated blood loss was minimal. Procedure:                Pre-Anesthesia Assessment:                           - Prior to the procedure, a History and Physical                            was performed, and patient medications and                            allergies were reviewed. The patient's tolerance of                            previous anesthesia was also reviewed. The risks                            and benefits of the procedure and the sedation                            options and risks were discussed with the patient.                            All questions were answered, and informed consent                            was obtained. Prior Anticoagulants: The patient has                            taken Coumadin (warfarin), last dose was 2 days                            prior to procedure. ASA Grade Assessment: III - A                            patient with severe systemic disease. After  reviewing the risks and benefits, the patient was                            deemed in satisfactory condition to undergo the                            procedure.                           After obtaining informed consent, the endoscope was                            passed under direct vision. Throughout the                            procedure, the  patient's blood pressure, pulse, and                            oxygen saturations were monitored continuously. The                            [id number]-H190 ([id number] Olympus endoscope was introduced                            through the mouth, and advanced to the second part                            of duodenum. The upper GI endoscopy was                            accomplished without difficulty. The patient                            tolerated the procedure well. Scope In: Scope Out: Findings:      The examined esophagus was normal.      Diffuse mild inflammation characterized by congestion (edema) was found       in the gastric fundus, in the gastric body and in the gastric antrum.       Biopsies were taken with a cold forceps for Helicobacter pylori testing.      The examined duodenum was normal. Impression:               - Normal esophagus.                           - Mild, non-ulcer, non-bleeding gastritis. Biopsied.                           - Normal examined duodenum. Recommendation:           - Return patient to hospital ward for ongoing care.                           - Clear liquid diet today.                           -  Continue present medications.                           - Await pathology results.                           - Will plan for slow prep through the day today. If                            no further bleeding and Hgb/Hct stable, will plan                            for continued observation. If bleed with prep or                            needing pRBC transfusion, plan for colonoscopy +/-                            VCE as inpatient.                           - NPO at MN (except prep) in the event that he                            needs colonoscopy tomorrow.                           - I communicated these results with the patient,                            his wife by phone, and with the primary Hospitalist                            service. Procedure  Code(s):        --- Professional ---                           [phone], Esophagogastroduodenoscopy, flexible,                            transoral; with biopsy, single or multiple Diagnosis Code(s):        --- Professional ---                           K29.70, Gastritis, unspecified, without bleeding                           R19.5, Other fecal abnormalities                           K92.1, Melena (includes Hematochezia) CPT copyright 2019 American Medical Association. All rights reserved. The codes documented in this report are preliminary and upon coder review may  be revised to meet current compliance requirements. Gerrit Heck, MD 07/11/2021 8:45:08 AM Number of Addenda: 0

## 2021-07-11 NOTE — Anesthesia Procedure Notes (Signed)
Procedure Name: MAC Date/Time: 07/11/2021 8:15 AM Performed by: Mariea Clonts, CRNA Pre-anesthesia Checklist: Patient identified, Emergency Drugs available, Suction available, Patient being monitored and Timeout performed Patient Re-evaluated:Patient Re-evaluated prior to induction Oxygen Delivery Method: Nasal cannula and Simple face mask Preoxygenation: Pre-oxygenation with 100% oxygen
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
## 2021-07-12 DIAGNOSIS — F419 Anxiety disorder, unspecified: Secondary | ICD-10-CM | POA: Diagnosis present

## 2021-07-12 DIAGNOSIS — I953 Hypotension of hemodialysis: Secondary | ICD-10-CM | POA: Diagnosis present

## 2021-07-12 DIAGNOSIS — K2990 Gastroduodenitis, unspecified, without bleeding: Secondary | ICD-10-CM

## 2021-07-12 DIAGNOSIS — Z7901 Long term (current) use of anticoagulants: Secondary | ICD-10-CM | POA: Diagnosis not present

## 2021-07-12 DIAGNOSIS — I482 Chronic atrial fibrillation, unspecified: Secondary | ICD-10-CM | POA: Diagnosis not present

## 2021-07-12 DIAGNOSIS — R195 Other fecal abnormalities: Secondary | ICD-10-CM | POA: Diagnosis not present

## 2021-07-12 DIAGNOSIS — F17210 Nicotine dependence, cigarettes, uncomplicated: Secondary | ICD-10-CM | POA: Diagnosis present

## 2021-07-12 DIAGNOSIS — R410 Disorientation, unspecified: Secondary | ICD-10-CM | POA: Diagnosis present

## 2021-07-12 DIAGNOSIS — G8929 Other chronic pain: Secondary | ICD-10-CM | POA: Diagnosis present

## 2021-07-12 DIAGNOSIS — K922 Gastrointestinal hemorrhage, unspecified: Secondary | ICD-10-CM | POA: Diagnosis present

## 2021-07-12 DIAGNOSIS — E669 Obesity, unspecified: Secondary | ICD-10-CM | POA: Diagnosis present

## 2021-07-12 DIAGNOSIS — Q438 Other specified congenital malformations of intestine: Secondary | ICD-10-CM | POA: Diagnosis not present

## 2021-07-12 DIAGNOSIS — K7400 Hepatic fibrosis, unspecified: Secondary | ICD-10-CM | POA: Diagnosis present

## 2021-07-12 DIAGNOSIS — R791 Abnormal coagulation profile: Secondary | ICD-10-CM | POA: Diagnosis present

## 2021-07-12 DIAGNOSIS — D631 Anemia in chronic kidney disease: Secondary | ICD-10-CM | POA: Diagnosis present

## 2021-07-12 DIAGNOSIS — Z825 Family history of asthma and other chronic lower respiratory diseases: Secondary | ICD-10-CM | POA: Diagnosis not present

## 2021-07-12 DIAGNOSIS — M549 Dorsalgia, unspecified: Secondary | ICD-10-CM | POA: Diagnosis present

## 2021-07-12 DIAGNOSIS — K5521 Angiodysplasia of colon with hemorrhage: Secondary | ICD-10-CM | POA: Diagnosis present

## 2021-07-12 DIAGNOSIS — Z992 Dependence on renal dialysis: Secondary | ICD-10-CM | POA: Diagnosis not present

## 2021-07-12 DIAGNOSIS — Z6835 Body mass index (BMI) 35.0-35.9, adult: Secondary | ICD-10-CM | POA: Diagnosis not present

## 2021-07-12 DIAGNOSIS — N186 End stage renal disease: Secondary | ICD-10-CM | POA: Diagnosis present

## 2021-07-12 DIAGNOSIS — I120 Hypertensive chronic kidney disease with stage 5 chronic kidney disease or end stage renal disease: Secondary | ICD-10-CM | POA: Diagnosis present

## 2021-07-12 DIAGNOSIS — N2581 Secondary hyperparathyroidism of renal origin: Secondary | ICD-10-CM | POA: Diagnosis present

## 2021-07-12 DIAGNOSIS — K2970 Gastritis, unspecified, without bleeding: Secondary | ICD-10-CM | POA: Diagnosis present

## 2021-07-12 DIAGNOSIS — K7460 Unspecified cirrhosis of liver: Secondary | ICD-10-CM | POA: Diagnosis present

## 2021-07-12 DIAGNOSIS — K5520 Angiodysplasia of colon without hemorrhage: Secondary | ICD-10-CM | POA: Diagnosis not present

## 2021-07-12 DIAGNOSIS — I4821 Permanent atrial fibrillation: Secondary | ICD-10-CM | POA: Diagnosis present

## 2021-07-12 DIAGNOSIS — K640 First degree hemorrhoids: Secondary | ICD-10-CM | POA: Diagnosis present

## 2021-07-12 DIAGNOSIS — Z20822 Contact with and (suspected) exposure to covid-19: Secondary | ICD-10-CM | POA: Diagnosis present

## 2021-07-12 LAB — RENAL FUNCTION PANEL
Albumin: 3.2 g/dL — ABNORMAL LOW (ref 3.5–5.0)
Anion gap: 11 (ref 5–15)
BUN: 23 mg/dL — ABNORMAL HIGH (ref 6–20)
CO2: 29 mmol/L (ref 22–32)
Calcium: 8.6 mg/dL — ABNORMAL LOW (ref 8.9–10.3)
Chloride: 97 mmol/L — ABNORMAL LOW (ref 98–111)
Creatinine, Ser: 6.72 mg/dL — ABNORMAL HIGH (ref 0.61–1.24)
GFR, Estimated: 9 mL/min — ABNORMAL LOW (ref >60)
Glucose, Bld: 82 mg/dL (ref 70–99)
Phosphorus: 4.9 mg/dL — ABNORMAL HIGH (ref 2.5–4.6)
Potassium: 3.2 mmol/L — ABNORMAL LOW (ref 3.5–5.1)
Sodium: 137 mmol/L (ref 135–145)

## 2021-07-12 LAB — CBC
HCT: 24 % — ABNORMAL LOW (ref 39.0–52.0)
Hemoglobin: 7.2 g/dL — ABNORMAL LOW (ref 13.0–17.0)
MCH: 29.8 pg (ref 26.0–34.0)
MCHC: 30 g/dL (ref 30.0–36.0)
MCV: 99.2 fL (ref 80.0–100.0)
Platelets: 215 10*3/uL (ref 150–400)
RBC: 2.42 MIL/uL — ABNORMAL LOW (ref 4.22–5.81)
RDW: 16.3 % — ABNORMAL HIGH (ref 11.5–15.5)
WBC: 4.4 10*3/uL (ref 4.0–10.5)
nRBC: 0 % (ref 0.0–0.2)

## 2021-07-12 LAB — TYPE AND SCREEN
ABO/RH(D): O POS
Antibody Screen: NEGATIVE
Weak D: POSITIVE

## 2021-07-12 MED ORDER — BISACODYL 5 MG PO TBEC
20.0000 mg | DELAYED_RELEASE_TABLET | Freq: Once | ORAL | Status: AC
  Administered 2021-07-12: 20 mg via ORAL
  Filled 2021-07-12: qty 4

## 2021-07-12 MED ORDER — PEG-KCL-NACL-NASULF-NA ASC-C 100 G PO SOLR
1.0000 | Freq: Once | ORAL | Status: AC
  Administered 2021-07-12: 200 g via ORAL
  Filled 2021-07-12: qty 1

## 2021-07-12 NOTE — Progress Notes (Signed)
[facility] Progress Note   Subjective: Seen in room. Completing bowel prep. Denies melena this am.    Objective Vitals:   07/11/21 1040 07/11/21 2005 07/12/21 0422 07/12/21 0833  BP: 136/77 109/69 124/70 99/61  Pulse: 82 80 78 75  Resp: '16 17 18 18  ' Temp: 97.8 F (36.6 C) 97.8 F (36.6 C) 98 F (36.7 C) (!) 97.5 F (36.4 C)  TempSrc: Oral Oral Oral Oral  SpO2: 100% 100% 98% 98%  Weight:      Height:         Additional Objective Labs: Basic Metabolic Panel: Recent Labs  Lab 07/11/21 0340 07/11/21 1518 07/12/21 0719  NA 138 134* 137  K 5.5* 4.0 3.2*  CL 103 97* 97*  CO2 '28 26 29  ' GLUCOSE 99 89 82  BUN 49* 20 23*  CREATININE 2.67* 5.76* 6.72*  CALCIUM 9.2 9.3 8.6*  PHOS  --  4.3 4.9*   CBC: Recent Labs  Lab 07/08/21 0335 07/10/21 0930 07/11/21 0340 07/11/21 1114 07/11/21 1518 07/12/21 0719  WBC 6.2 5.6 5.7  --  5.1 4.4  HGB 7.9* 7.9* 8.0* 7.3* 7.9* 7.2*  HCT 25.1* 26.1* 26.5* 24.5* 25.5* 24.0*  MCV 95.4 99.2 99.3  --  98.1 99.2  PLT 209 241 216  --  222 215   Blood Culture    Component Value Date/Time   SDES BLOOD RIGHT ANTECUBITAL 07/06/2021 1659   SPECREQUEST  07/06/2021 1659    BOTTLES DRAWN AEROBIC AND ANAEROBIC Blood Culture adequate volume   CULT  07/06/2021 1659    NO GROWTH 5 DAYS Performed at [facility], [place] [address], [place], [place] [address]    REPTSTATUS 07/11/2021 FINAL 07/06/2021 1659     Physical Exam General: Well appearing, nad  Heart: RRR No g,r Lungs: Clear bilaterally  Abdomen: Obese, soft non-tender  Extremities: No sig LE edema  Dialysis Access: LUE AVF +bruit   Medications:   amiodarone  200 mg Oral Daily   bisacodyl  20 mg Oral Once   cinacalcet  180 mg Oral Q supper   [START ON 07/13/2021] clonazePAM  1 mg Oral Q T,Th,Sa-HD   [START ON 07/13/2021] darbepoetin (ARANESP) injection - DIALYSIS  150 mcg Intravenous Q Tue-HD   multivitamin  1 tablet Oral Daily   pantoprazole  40 mg Oral  Daily   patiromer  8.4 g Oral Daily   peg 3350 powder  1 kit Oral Once   rOPINIRole  0.25 mg Oral QHS   rosuvastatin  5 mg Oral QHS    Dialysis Orders:  TTS - [facility]  4hrs11mn, BFR 450, DFR 800,  EDW 130kg, 2K/ 3Ca   Access: L AVF  No Heparin Mircera 225 mcg q2wks (dose recently raised: 2022m last given 06/22/21) Hectorol 48m13mIV qHD-last dose 07/10/21  Assessment/Plan: GIB-GI following. S/p EGD 8/14 -esophagus/duodenum normal, non-bleeding gastritis-biopsied. Plan for Colonoscopy per GI  Chronic AF-AC on held, continue amiodarone ESRD - on HD TTS. No emergent need for HD at this time. Plan for HD 07/13/21. Spoke with patient and his wife. He takes Klonopin PRN on HD days for anxiety.  Hypertension/volume  - Patient euvolemic on exam, BP soft.  Anemia of CKD - Hgb now 7.3-will resume ESA here. Aranesp 150 with HD 8/16.  Blood transfusion if indicated (less than 7).  Secondary Hyperparathyroidism -  Ca/Phos ok. Continue binders when eating. Continue sensipar.    OgeLynnda Child-C [facility] 07/12/2021,11:17 AM
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
## 2021-07-12 NOTE — Progress Notes (Signed)
Daily Rounding Note  07/12/2021, 10:17 AM  LOS: 0 days   SUBJECTIVE:   Chief complaint:   Melenic, FOBT positive stool.  Abdominal pain.  Gastritis at EGD.  Patient not having abdominal pain.  Stool is brown, watery but not clear after completing split dose movie prep.  Did not experience nausea with the prep.  Complaining of back pain, which is chronic for him and the reason he takes narcotics.  OBJECTIVE:         Vital signs in last 24 hours:    Temp:  [97.5 F (36.4 C)-98 F (36.7 C)] 97.5 F (36.4 C) (08/15 0833) Pulse Rate:  [75-82] 75 (08/15 0833) Resp:  [16-18] 18 (08/15 0833) BP: (99-136)/(61-77) 99/61 (08/15 0833) SpO2:  [98 %-100 %] 98 % (08/15 0833)   Filed Weights   07/10/21 2015 07/11/21 0736  Weight: 133.8 kg 133.8 kg   General: NAD.  Looks chronically ill and somewhat older than stated age. Heart: RRR.  Harsh murmur.  Telemetry with NSR in the 70s. Chest: No labored breathing or cough. Abdomen: Soft, not tender, not distended.  Active bowel sounds. Extremities: No CCE. Neuro/Psych: Cooperative, calm, pleasant.  Moves all 4 limbs without obvious deficits or tremors.  Intake/Output from previous day: 08/14 0701 - 08/15 0700 In: 770 [P.O.:420; I.V.:350] Out: 0   Intake/Output this shift: No intake/output data recorded.  Lab Results: Recent Labs    07/11/21 0340 07/11/21 1114 07/11/21 1518 07/12/21 0719  WBC 5.7  --  5.1 4.4  HGB 8.0* 7.3* 7.9* 7.2*  HCT 26.5* 24.5* 25.5* 24.0*  PLT 216  --  222 215   BMET Recent Labs    07/11/21 0340 07/11/21 1518 07/12/21 0719  NA 138 134* 137  K 5.5* 4.0 3.2*  CL 103 97* 97*  CO2 28 26 29   GLUCOSE 99 89 82  BUN 49* 20 23*  CREATININE 2.67* 5.76* 6.72*  CALCIUM 9.2 9.3 8.6*   LFT Recent Labs    07/10/21 0930 07/11/21 1518 07/12/21 0719  PROT 7.3  --   --   ALBUMIN 3.7 3.5 3.2*  AST 21  --   --   ALT 17  --   --   ALKPHOS 65  --   --    BILITOT 0.8  --   --    PT/INR Recent Labs    07/10/21 0930  LABPROT 19.5*  INR 1.7*   Hepatitis Panel No results for input(s): HEPBSAG, HCVAB, HEPAIGM, HEPBIGM in the last 72 hours.  Studies/Results: CT Abdomen Pelvis Wo Contrast  Result Date: 07/10/2021 CLINICAL DATA:  Abdominal trauma.  Dialysis patient. EXAM: CT ABDOMEN AND PELVIS WITHOUT CONTRAST TECHNIQUE: Multidetector CT imaging of the abdomen and pelvis was performed following the standard protocol without IV contrast. COMPARISON:  CT 07/17/2018, 07/05/2021 FINDINGS: Lower chest: Lung bases are clear. Hepatobiliary: No focal hepatic lesion. Post cholecystectomy. Periphery calcified 2.4 cm collection in the gallbladder fossa is unchanged from multiple comparison exams back to 2019. Pancreas: Pancreas is normal. No ductal dilatation. No pancreatic inflammation. Spleen: Normal spleen Adrenals/urinary tract: Thickening of the RIGHT adrenal gland to 14 mm compares to 18 mm on CT 2019 bilateral renal cortical thinning. Ureters and bladder normal. Stomach/Bowel: Is Stomach, small bowel, appendix, and cecum are normal. The colon and rectosigmoid colon are normal. Vascular/Lymphatic: Abdominal aorta is normal caliber with atherosclerotic calcification. There is no retroperitoneal or periportal lymphadenopathy. No pelvic lymphadenopathy. Reproductive: Prostate unremarkable Other:  No free fluid. Musculoskeletal: . Superior endplate compression fracture at L2 appears chronic and not changed from recent CT exam (05/10/2021) but new from CT 07/17/2018. IMPRESSION: 1. No evidence abdominal trauma. 2. No acute findings abdomen pelvis. 3. No change from comparison CTs. Electronically Signed   By: Suzy Bouchard M.D.   On: 07/10/2021 12:10    ASSESMENT:   Melena.  Abdominal pain.  FOBT positive. 07/11/2021 EGD with gastric body/antrum inflammation bleeding but no ulcer.  Biopsies obtained, path pending.  Protonix 40 mg daily  Anemia.  Some of this  related to blood loss but also has anemia of ESRD.  Receives Aranesp every Tuesday at dialysis.     ESRD.  Dialyzes at [facility] TTS.     Atrial fibrillation.  Chronic Coumadin.  INR 7.9 PTA.  TVA colon polyp 2018.  Latest colonoscopy 02/2020 aborted due to poor prep.  04/2020 colonoscopy also with poor prep but a 4 mm polyp removed from transverse colon, tissue not retrieved. Recommended  2 day prep for next colonoscopy.    Sarcoidosis liver, ?cirrhosis.    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  Previous azathioprine but not for several years.    Several months of AMS.  MRI of brain with nothing acute.  Small, nonspecific hyperintense white matter lesions.  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   PLAN      Colonoscopy arranged for 730 tomorrow morning.  Give 20 mg Dulcolax now.  Repeat split dose bowel prep starting 3 PM and again at 10 PM today.    Steven Best Best  07/12/2021, 10:17 AM Phone [phone]

## 2021-07-13 ENCOUNTER — Other Ambulatory Visit (HOSPITAL_COMMUNITY): Payer: Self-pay

## 2021-07-13 ENCOUNTER — Encounter (HOSPITAL_COMMUNITY): Payer: Self-pay | Admitting: Internal Medicine

## 2021-07-13 ENCOUNTER — Other Ambulatory Visit: Payer: Self-pay

## 2021-07-13 ENCOUNTER — Encounter (HOSPITAL_COMMUNITY): Payer: Self-pay | Admitting: *Deleted

## 2021-07-13 ENCOUNTER — Inpatient Hospital Stay (HOSPITAL_COMMUNITY): Payer: Medicare Other | Admitting: Anesthesiology

## 2021-07-13 ENCOUNTER — Encounter (HOSPITAL_COMMUNITY)
Admission: EM | Disposition: A | Discharge status: Home / Self Care | Payer: Self-pay | Source: Ambulatory Visit | Attending: Internal Medicine

## 2021-07-13 DIAGNOSIS — K5520 Angiodysplasia of colon without hemorrhage: Secondary | ICD-10-CM

## 2021-07-13 HISTORY — PX: COLONOSCOPY WITH PROPOFOL: SHX5780

## 2021-07-13 HISTORY — PX: HOT HEMOSTASIS: SHX5433

## 2021-07-13 LAB — CBC
HCT: 26.1 % — ABNORMAL LOW (ref 39.0–52.0)
Hemoglobin: 8 g/dL — ABNORMAL LOW (ref 13.0–17.0)
MCH: 30.3 pg (ref 26.0–34.0)
MCHC: 30.7 g/dL (ref 30.0–36.0)
MCV: 98.9 fL (ref 80.0–100.0)
Platelets: 238 10*3/uL (ref 150–400)
RBC: 2.64 MIL/uL — ABNORMAL LOW (ref 4.22–5.81)
RDW: 16.5 % — ABNORMAL HIGH (ref 11.5–15.5)
WBC: 5.4 10*3/uL (ref 4.0–10.5)
nRBC: 0 % (ref 0.0–0.2)

## 2021-07-13 LAB — RENAL FUNCTION PANEL
Albumin: 3.5 g/dL (ref 3.5–5.0)
Anion gap: 16 — ABNORMAL HIGH (ref 5–15)
BUN: 29 mg/dL — ABNORMAL HIGH (ref 6–20)
CO2: 27 mmol/L (ref 22–32)
Calcium: 8.2 mg/dL — ABNORMAL LOW (ref 8.9–10.3)
Chloride: 93 mmol/L — ABNORMAL LOW (ref 98–111)
Creatinine, Ser: 7.98 mg/dL — ABNORMAL HIGH (ref 0.61–1.24)
GFR, Estimated: 7 mL/min — ABNORMAL LOW (ref >60)
Glucose, Bld: 96 mg/dL (ref 70–99)
Phosphorus: 4.9 mg/dL — ABNORMAL HIGH (ref 2.5–4.6)
Potassium: 3.5 mmol/L (ref 3.5–5.1)
Sodium: 136 mmol/L (ref 135–145)

## 2021-07-13 LAB — SURGICAL PATHOLOGY

## 2021-07-13 SURGERY — COLONOSCOPY WITH PROPOFOL
Anesthesia: Monitor Anesthesia Care

## 2021-07-13 MED ORDER — APIXABAN 5 MG PO TABS
5.0000 mg | ORAL_TABLET | Freq: Two times a day (BID) | ORAL | 0 refills | Status: AC
  Filled 2021-07-13: qty 60, 30d supply, fill #0

## 2021-07-13 MED ORDER — DARBEPOETIN ALFA 150 MCG/0.3ML IJ SOSY
PREFILLED_SYRINGE | INTRAMUSCULAR | Status: AC
  Filled 2021-07-13: qty 0.3

## 2021-07-13 MED ORDER — PENTAFLUOROPROP-TETRAFLUOROETH EX AERO: 1.0000 "application " | INHALATION_SPRAY | CUTANEOUS | Status: DC | PRN

## 2021-07-13 MED ORDER — PROPOFOL 10 MG/ML IV BOLUS
INTRAVENOUS | Status: DC | PRN
  Administered 2021-07-13 (×5): 20 mg via INTRAVENOUS
  Administered 2021-07-13: 40 mg via INTRAVENOUS
  Administered 2021-07-13: 20 mg via INTRAVENOUS

## 2021-07-13 MED ORDER — PROPOFOL 500 MG/50ML IV EMUL
INTRAVENOUS | Status: DC | PRN
  Administered 2021-07-13: 100 ug/kg/min via INTRAVENOUS
  Administered 2021-07-13 (×2): 150 ug/kg/min via INTRAVENOUS

## 2021-07-13 MED ORDER — APIXABAN 2.5 MG PO TABS: 2.5000 mg | ORAL_TABLET | Freq: Two times a day (BID) | ORAL | Status: DC

## 2021-07-13 MED ORDER — APIXABAN 2.5 MG PO TABS
2.5000 mg | ORAL_TABLET | Freq: Two times a day (BID) | ORAL | 0 refills | Status: DC
  Filled 2021-07-13: qty 60, 30d supply, fill #0

## 2021-07-13 MED ORDER — PANTOPRAZOLE SODIUM 40 MG PO TBEC
40.0000 mg | DELAYED_RELEASE_TABLET | Freq: Every day | ORAL | 0 refills | Status: AC
  Filled 2021-07-13: qty 30, 30d supply, fill #0

## 2021-07-13 MED ORDER — SODIUM CHLORIDE 0.9 % IV SOLN: INTRAVENOUS | Status: DC | PRN

## 2021-07-13 MED ORDER — APIXABAN 5 MG PO TABS
5.0000 mg | ORAL_TABLET | Freq: Two times a day (BID) | ORAL | Status: DC
  Filled 2021-07-13: qty 1

## 2021-07-13 MED ORDER — LIDOCAINE-PRILOCAINE 2.5-2.5 % EX CREA: 1.0000 "application " | TOPICAL_CREAM | CUTANEOUS | Status: DC | PRN

## 2021-07-13 MED ORDER — EYE WASH OPHTH SOLN
1.0000 [drp] | OPHTHALMIC | Status: DC | PRN
  Filled 2021-07-13: qty 118

## 2021-07-13 MED ORDER — ALTEPLASE 2 MG IJ SOLR: 2.0000 mg | Freq: Once | INTRAMUSCULAR | Status: DC | PRN

## 2021-07-13 MED ORDER — LIDOCAINE HCL (PF) 1 % IJ SOLN: 5.0000 mL | INTRAMUSCULAR | Status: DC | PRN

## 2021-07-13 MED ORDER — SODIUM CHLORIDE 0.9 % IV SOLN: 100.0000 mL | INTRAVENOUS | Status: DC | PRN

## 2021-07-13 MED ORDER — HEPARIN SODIUM (PORCINE) 1000 UNIT/ML DIALYSIS
1000.0000 [IU] | INTRAMUSCULAR | Status: DC | PRN
  Filled 2021-07-13: qty 1

## 2021-07-13 MED ORDER — HYDROMORPHONE HCL 1 MG/ML IJ SOLN
INTRAMUSCULAR | Status: AC
  Filled 2021-07-13: qty 0.5

## 2021-07-13 MED ORDER — POLYETHYLENE GLYCOL 3350 17 GM/SCOOP PO POWD
17.0000 g | Freq: Every day | ORAL | 0 refills | Status: AC | PRN
  Filled 2021-07-13: qty 510, 30d supply, fill #0

## 2021-07-13 NOTE — Progress Notes (Signed)
  [facility] Progress Note   Subjective:  Seen in room. Had colonoscopy this am - single non-bleeding AVM treated. No complaints. For dialysis today.    Objective Vitals:   07/13/21 0850 07/13/21 0900 07/13/21 0901 07/13/21 0919  BP: (!) 109/48  (!) 114/53 115/61  Pulse: 74 74 72 69  Resp: 18 17 16 16   Temp:    97.6 F (36.4 C)  TempSrc:    Oral  SpO2: 100% 99% 97% 100%  Weight:      Height:         Additional Objective Labs: Basic Metabolic Panel: Recent Labs  Lab 07/11/21 1518 07/12/21 0719 07/13/21 0925  NA 134* 137 136  K 4.0 3.2* 3.5  CL 97* 97* 93*  CO2 26 29 27   GLUCOSE 89 82 96  BUN 20 23* 29*  CREATININE 5.76* 6.72* 7.98*  CALCIUM 9.3 8.6* 8.2*  PHOS 4.3 4.9* 4.9*    CBC: Recent Labs  Lab 07/08/21 0335 07/10/21 0930 07/11/21 0340 07/11/21 1114 07/11/21 1518 07/12/21 0719  WBC 6.2 5.6 5.7  --  5.1 4.4  HGB 7.9* 7.9* 8.0* 7.3* 7.9* 7.2*  HCT 25.1* 26.1* 26.5* 24.5* 25.5* 24.0*  MCV 95.4 99.2 99.3  --  98.1 99.2  PLT 209 241 216  --  222 215    Blood Culture    Component Value Date/Time   SDES BLOOD RIGHT ANTECUBITAL 07/06/2021 1659   SPECREQUEST  07/06/2021 1659    BOTTLES DRAWN AEROBIC AND ANAEROBIC Blood Culture adequate volume   CULT  07/06/2021 1659    NO GROWTH 5 DAYS Performed at [facility], [place] [address], [place], [place] [address]    REPTSTATUS 07/11/2021 FINAL 07/06/2021 1659     Physical Exam General: Well appearing, nad  Heart: RRR No g,r Lungs: Clear bilaterally  Abdomen: Obese, soft non-tender  Extremities: No sig LE edema  Dialysis Access: LUE AVF +bruit   Medications:  [START ON 07/14/2021] sodium chloride     [START ON 07/14/2021] sodium chloride      amiodarone  200 mg Oral Daily   cinacalcet  180 mg Oral Q supper   clonazePAM  1 mg Oral Q T,Th,Sa-HD   darbepoetin (ARANESP) injection - DIALYSIS  150 mcg Intravenous Q Tue-HD   multivitamin  1 tablet Oral Daily   pantoprazole  40 mg  Oral Daily   rOPINIRole  0.25 mg Oral QHS   rosuvastatin  5 mg Oral QHS    Dialysis Orders:  TTS - [facility]  4hrs44min, BFR 450, DFR 800,  EDW 130kg, 2K/ 3Ca   Access: L AVF  No Heparin Mircera 225 mcg q2wks (dose recently raised: 270mcg last given 06/22/21) Hectorol 77mcg IV qHD-last dose 07/10/21  Assessment/Plan: GIB-GI following. S/p EGD 8/14 -esophagus/duodenum normal, non-bleeding gastritis-biopsied. Colonoscopy 8/16 --single non-bleeding AVM treated. Hb trending down.  Chronic AFib. AC held.  continue amiodarone. Prev on Coumadin -May be able to get assistance for Eliquis  ESRD - on HD TTS. HD today  Hypertension/volume  - BP ok. No gross volume on exam. UF to EDW as tolerated  Anemia of CKD - Hgb 7.3-will resume ESA here. Aranesp 150 with HD 8/16.  Blood transfusion if indicated (less than 7).  Secondary Hyperparathyroidism -  Ca/Phos ok. Continue binders when eating. Continue sensipar.    Lynnda Child PA-C [facility] 07/13/2021,11:59 AM

## 2021-07-13 NOTE — Anesthesia Preprocedure Evaluation (Signed)
Anesthesia Evaluation  Patient identified by MRN, date of birth, ID band Patient awake    Reviewed: Allergy & Precautions, NPO status , Patient's Chart, lab work & pertinent test results  Airway Mallampati: II  TM Distance: >3 FB Neck ROM: Full    Dental no notable dental hx.    Pulmonary neg pulmonary ROS, Current Smoker,    Pulmonary exam normal breath sounds clear to auscultation       Cardiovascular hypertension, + dysrhythmias Atrial Fibrillation  Rhythm:Irregular Rate:Normal     Neuro/Psych negative neurological ROS  negative psych ROS   GI/Hepatic negative GI ROS, Neg liver ROS,   Endo/Other  negative endocrine ROS  Renal/GU DialysisRenal disease  negative genitourinary   Musculoskeletal negative musculoskeletal ROS (+)   Abdominal   Peds negative pediatric ROS (+)  Hematology  (+) anemia ,   Anesthesia Other Findings   Reproductive/Obstetrics negative OB ROS                             Anesthesia Physical Anesthesia Plan  ASA: 3  Anesthesia Plan: MAC   Post-op Pain Management:    Induction: Intravenous  PONV Risk Score and Plan: 1 and Propofol infusion and Treatment may vary due to age or medical condition  Airway Management Planned: Simple Face Mask  Additional Equipment:   Intra-op Plan:   Post-operative Plan:   Informed Consent: I have reviewed the patients History and Physical, chart, labs and discussed the procedure including the risks, benefits and alternatives for the proposed anesthesia with the patient or authorized representative who has indicated his/her understanding and acceptance.     Dental advisory given  Plan Discussed with: CRNA and Surgeon  Anesthesia Plan Comments:         Anesthesia Quick Evaluation

## 2021-07-13 NOTE — Op Note (Signed)
[facility] Patient Name: Steven Best Procedure Date : 07/13/2021 MRN: [id number] Attending MD: Gladstone Pih. Candis Schatz , MD Date of Birth: 02-21-1966 CSN: [id number] Age: 55 Admit Type: Inpatient Procedure:                Colonoscopy Indications:              Hematochezia, Melena. Providers:                Gladstone Pih. Candis Schatz, MD, Jobe Igo, RN, Elspeth Cho Tech., Technician, Dellie Catholic, CRNA Referring MD:              Medicines:                Monitored Anesthesia Care Complications:            No immediate complications. Estimated Blood Loss:     Estimated blood loss: none. Procedure:                Pre-Anesthesia Assessment:                           - Prior to the procedure, a History and Physical                            was performed, and patient medications and                            allergies were reviewed. The patient's tolerance of                            previous anesthesia was also reviewed. The risks                            and benefits of the procedure and the sedation                            options and risks were discussed with the patient.                            All questions were answered, and informed consent                            was obtained. Prior Anticoagulants: The patient has                            taken Coumadin (warfarin). ASA Grade Assessment:                            III - A patient with severe systemic disease. After                            reviewing the risks and benefits, the patient was  deemed in satisfactory condition to undergo the                            procedure.                           After obtaining informed consent, the colonoscope                            was passed under direct vision. Throughout the                            procedure, the patient's blood pressure, pulse, and                            oxygen  saturations were monitored continuously. The                            CF-HQ190L ([id number] Olympus coloscope was                            introduced through the anus and advanced to the the                            terminal ileum, with identification of the                            appendiceal orifice and IC valve. The colonoscopy                            was technically difficult and complex due to a                            redundant colon and significant looping. Successful                            completion of the procedure was aided by using                            manual pressure and withdrawing and reinserting the                            scope. The patient tolerated the procedure well.                            The quality of the bowel preparation was fair. Scope In: 7:48:39 AM Scope Out: 8:33:01 AM Scope Withdrawal Time: 0 hours 26 minutes 22 seconds  Total Procedure Duration: 0 hours 44 minutes 22 seconds  Findings:      The perianal and digital rectal examinations were normal. Pertinent       negatives include normal sphincter tone and no palpable rectal lesions.      A single medium-sized angioectasia without bleeding was found in the       proximal transverse colon. Coagulation for tissue destruction using  argon plasma at 0.5 liters/minute and 20 watts was successful. Estimated       blood loss: none.      A tattoo was seen in the descending colon. The tattoo site appeared       normal.      The exam was otherwise normal throughout the examined colon.      The terminal ileum appeared normal.      Non-bleeding internal hemorrhoids were found during retroflexion. The       hemorrhoids were Grade I (internal hemorrhoids that do not prolapse).      No additional abnormalities were found on retroflexion. Impression:               - Preparation of the colon was fair.                           - A single non-bleeding colonic angioectasia.                             Treated with argon plasma coagulation (APC). This                            is the likely source of the patient's GI bleed.                           - A tattoo was seen in the descending colon. The                            tattoo site and the surrounding colon appeared                            normal.                           - The examined portion of the ileum was normal.                           - Non-bleeding internal hemorrhoids.                           - No specimens collected. Recommendation:           - Return patient to hospital ward for ongoing care                            and hemodialysis. Ok to discharge home today from a                            GI standpoint                           - Resume previous diet.                           - Continue present medications. Ok to resume  coumadin/anticoagulation                           - Repeat colonoscopy in 3 years because the bowel                            preparation was suboptimal.                           - Patient completed a 2 day bowel prep with 4                            liters of Moviprep and 2+ days of a clear liquid                            diet and still did not not have an adequate bowel                            prep, although it was adequate to detect larger                            lesions. In three years, recommend patient have                            clinic appointment with gastroenterologist to                            discuss other potential methods of bowel cleansing. Procedure Code(s):        --- Professional ---                           [phone], Colonoscopy, flexible; with ablation of                            tumor(s), polyp(s), or other lesion(s) (includes                            pre- and post-dilation and guide wire passage, when                            performed) Diagnosis Code(s):        --- Professional ---                            K64.0, First degree hemorrhoids                           K55.20, Angiodysplasia of colon without hemorrhage                           K92.1, Melena (includes Hematochezia) CPT copyright 2019 American Medical Association. All rights reserved. The codes documented in this report are preliminary and upon coder review may  be revised to meet current compliance requirements. Laquesha Holcomb E. Candis Schatz, MD 07/13/2021 8:47:16 AM This report has been signed electronically. Number  of Addenda: 0

## 2021-07-13 NOTE — Anesthesia Postprocedure Evaluation (Signed)
Anesthesia Post Note  Patient: Steven Best  Procedure(s) Performed: COLONOSCOPY WITH PROPOFOL HOT HEMOSTASIS (ARGON PLASMA COAGULATION/BICAP)     Patient location during evaluation: PACU Anesthesia Type: MAC Level of consciousness: awake and alert Pain management: pain level controlled Vital Signs Assessment: post-procedure vital signs reviewed and stable Respiratory status: spontaneous breathing, nonlabored ventilation, respiratory function stable and patient connected to nasal cannula oxygen Cardiovascular status: stable and blood pressure returned to baseline Postop Assessment: no apparent nausea or vomiting Anesthetic complications: no   No notable events documented.  Last Vitals:  Vitals:   07/13/21 0710 07/13/21 0842  BP: (!) 136/59 (!) 110/47  Pulse: 79 73  Resp: 13 14  Temp: 36.5 C 36.4 C  SpO2: 100% 100%    Last Pain:  Vitals:   07/13/21 0842  TempSrc: Axillary  PainSc: 0-No pain                 Avelina Mcclurkin S

## 2021-07-13 NOTE — Discharge Instructions (Signed)

## 2021-07-13 NOTE — Plan of Care (Signed)

## 2021-07-13 NOTE — Anesthesia Procedure Notes (Signed)
Procedure Name: MAC Date/Time: 07/13/2021 7:41 AM Performed by: Carolan Clines, CRNA Pre-anesthesia Checklist: Patient identified, Emergency Drugs available, Suction available and Patient being monitored Patient Re-evaluated:Patient Re-evaluated prior to induction Oxygen Delivery Method: Simple face mask Dental Injury: Teeth and Oropharynx as per pre-operative assessment

## 2021-07-13 NOTE — Progress Notes (Signed)
Discharge teaching given to patient. Patient verbalized understanding of teaching. Patient currently waiting in room for wife to come pick him up for discharge

## 2021-07-13 NOTE — Progress Notes (Signed)
Patient discharged to home via wheelchair with wife with all belongings. Patient requested eyewash before leaving and it was ordered by Dr. Posey Pronto but patient did not want to wait for it and decided to go ahead and leave.

## 2021-07-13 NOTE — Transfer of Care (Signed)
Immediate Anesthesia Transfer of Care Note  Patient: Steven Best  Procedure(s) Performed: COLONOSCOPY WITH PROPOFOL HOT HEMOSTASIS (ARGON PLASMA COAGULATION/BICAP)  Patient Location: Endoscopy Unit  Anesthesia Type:MAC  Level of Consciousness: drowsy  Airway & Oxygen Therapy: Patient Spontanous Breathing and Patient connected to face mask oxygen  Post-op Assessment: Report given to RN and Post -op Vital signs reviewed and stable  Post vital signs: Reviewed and stable  Last Vitals:  Vitals Value Taken Time  BP 110/47 07/13/21 0841  Temp    Pulse 73 07/13/21 0841  Resp 14 07/13/21 0841  SpO2 100 % 07/13/21 0841  Vitals shown include unvalidated device data.  Last Pain:  Vitals:   07/13/21 0710  TempSrc: Oral  PainSc: 7       Patients Stated Pain Goal: 0 (38/17/71 1657)  Complications: No notable events documented.

## 2021-07-13 NOTE — TOC Benefit Eligibility Note (Signed)
Patient Teacher, English as a foreign language completed.    The patient is currently admitted and upon discharge could be taking Eliquis 5 mg.  The current 30 day co-pay is, $142.00 due to a $95.00 deductible remaining. Will be $47.00 monthly after meeting deductible.   The patient is insured through [place], [place] Patient Advocate Specialist [place] Team Direct Number: [phone]  Fax: [phone]

## 2021-07-13 NOTE — Discharge Summary (Signed)
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 Authorization (EUA). This EUA will remain  in effect (meaning this test can be used) for the duration of the COVID-19 declaration under Section 564(b)(1) of the Act, 21 U.S.C.section 360bbb-3(b)(1), unless the authorization is terminated  or revoked sooner.       Influenza A by PCR NEGATIVE NEGATIVE Final   Influenza B by PCR NEGATIVE NEGATIVE Final    Comment: (NOTE) The Xpert Xpress SARS-CoV-2/FLU/RSV plus assay is intended as an aid in the diagnosis of influenza from Nasopharyngeal swab specimens and should not be used as a sole basis for treatment. Nasal washings and aspirates are unacceptable for Xpert Xpress SARS-CoV-2/FLU/RSV testing.  Fact Sheet for Patients: EntrepreneurPulse.com.au  Fact Sheet for Healthcare Providers: IncredibleEmployment.be  This test is not yet approved or cleared by the [place] FDA and has been authorized for detection and/or diagnosis of SARS-CoV-2 by FDA under  an Emergency Use Authorization (EUA). This EUA will remain in effect (meaning this test can be used) for the duration of the COVID-19 declaration under Section 564(b)(1) of the Act, 21 U.S.C. section 360bbb-3(b)(1), unless the authorization is terminated or revoked.  Performed at [facility], [place] [address], [place], [place] [address]   Blood culture (routine x 2)     Status: Abnormal   Collection Time: 07/05/21  2:53 AM   Specimen: BLOOD  Result Value Ref Range Status   Specimen Description BLOOD RIGHT NECK  Final   Special Requests   Final    BOTTLES DRAWN AEROBIC AND ANAEROBIC Blood Culture adequate volume   Culture  Setup Time   Final    GRAM POSITIVE COCCI IN BOTH AEROBIC AND ANAEROBIC BOTTLES CRITICAL RESULT CALLED TO, READ BACK BY AND VERIFIED WITH: V BRYK,PHARMD@[id number]  07/06/21 [place] Performed at [facility], [place] [address], [place], [place] [address]    Culture STAPHYLOCOCCUS EPIDERMIDIS (A)  Final   Report Status 07/08/2021 FINAL  Final   Organism ID, Bacteria STAPHYLOCOCCUS EPIDERMIDIS  Final      Susceptibility   Staphylococcus epidermidis - MIC*    CIPROFLOXACIN <=0.5 SENSITIVE Sensitive     ERYTHROMYCIN <=0.25 SENSITIVE Sensitive     GENTAMICIN <=0.5 SENSITIVE Sensitive     OXACILLIN <=0.25 SENSITIVE Sensitive     TETRACYCLINE <=1 SENSITIVE Sensitive     VANCOMYCIN 1 SENSITIVE Sensitive     TRIMETH/SULFA <=10 SENSITIVE Sensitive     CLINDAMYCIN <=0.25 SENSITIVE Sensitive     RIFAMPIN <=0.5  SENSITIVE Sensitive     Inducible Clindamycin NEGATIVE Sensitive     * STAPHYLOCOCCUS EPIDERMIDIS  Blood Culture ID Panel (Reflexed)     Status: Abnormal   Collection Time: 07/05/21  2:53 AM  Result Value Ref Range Status   Enterococcus faecalis NOT DETECTED NOT DETECTED Final   Enterococcus Faecium NOT DETECTED NOT DETECTED Final   Listeria monocytogenes NOT DETECTED NOT DETECTED Final   Staphylococcus species DETECTED (A) NOT DETECTED Final    Comment: CRITICAL  RESULT CALLED TO, READ BACK BY AND VERIFIED WITH: V BRYK,PHARMD@[email or web address]  07/06/21 [place]    Staphylococcus aureus (BCID) NOT DETECTED NOT DETECTED Final   Staphylococcus epidermidis DETECTED (A) NOT DETECTED Final    Comment: CRITICAL RESULT CALLED TO, READ BACK BY AND VERIFIED WITH: V BRYK,PHARMD@[email or web address]  07/06/21 [place]    Staphylococcus lugdunensis NOT DETECTED NOT DETECTED Final   Streptococcus species NOT DETECTED NOT DETECTED Final   Streptococcus agalactiae NOT DETECTED NOT DETECTED Final   Streptococcus pneumoniae NOT DETECTED NOT DETECTED Final   Streptococcus pyogenes NOT DETECTED NOT DETECTED Final   A.calcoaceticus-baumannii NOT DETECTED NOT DETECTED Final   Bacteroides fragilis NOT DETECTED NOT DETECTED Final   Enterobacterales NOT DETECTED NOT DETECTED Final   Enterobacter cloacae complex NOT DETECTED NOT DETECTED Final   Escherichia coli NOT DETECTED NOT DETECTED Final   Klebsiella aerogenes NOT DETECTED NOT DETECTED Final   Klebsiella oxytoca NOT DETECTED NOT DETECTED Final   Klebsiella pneumoniae NOT DETECTED NOT DETECTED Final   Proteus species NOT DETECTED NOT DETECTED Final   Salmonella species NOT DETECTED NOT DETECTED Final   Serratia marcescens NOT DETECTED NOT DETECTED Final   Haemophilus influenzae NOT DETECTED NOT DETECTED Final   Neisseria meningitidis NOT DETECTED NOT DETECTED Final   Pseudomonas aeruginosa NOT DETECTED NOT DETECTED Final   Stenotrophomonas maltophilia NOT DETECTED NOT DETECTED Final   Candida albicans NOT DETECTED NOT DETECTED Final   Candida auris NOT DETECTED NOT DETECTED Final   Candida glabrata NOT DETECTED NOT DETECTED Final   Candida krusei NOT DETECTED NOT DETECTED Final   Candida parapsilosis NOT DETECTED NOT DETECTED Final   Candida tropicalis NOT DETECTED NOT DETECTED Final   Cryptococcus neoformans/gattii NOT DETECTED NOT DETECTED Final   Methicillin resistance mecA/C NOT DETECTED NOT DETECTED Final    Comment: Performed at [facility], [address], [place], [place] [address]  Culture, blood (single)     Status: Abnormal   Collection Time: 07/05/21  7:49 AM   Specimen: BLOOD RIGHT FOREARM  Result Value Ref Range Status   Specimen Description BLOOD RIGHT FOREARM  Final   Special Requests   Final    BOTTLES DRAWN AEROBIC AND ANAEROBIC Blood Culture adequate volume   Culture  Setup Time   Final    GRAM POSITIVE COCCI IN CLUSTERS IN BOTH AEROBIC AND ANAEROBIC BOTTLES CRITICAL VALUE NOTED.  VALUE IS CONSISTENT WITH PREVIOUSLY REPORTED AND CALLED VALUE.    Culture (A)  Final    STAPHYLOCOCCUS EPIDERMIDIS SUSCEPTIBILITIES PERFORMED ON PREVIOUS CULTURE WITHIN THE LAST 5 DAYS. Performed at [facility], [place] [address], [place],  [address]    Report Status 07/08/2021 FINAL  Final  Culture, blood (routine x 2)     Status: None   Collection Time: 07/06/21  4:51 PM   Specimen: BLOOD  Result Value Ref Range Status   Specimen Description BLOOD RIGHT ANTECUBITAL  Final   Special Requests   Final    BOTTLES DRAWN AEROBIC AND ANAEROBIC Blood  Culture adequate volume   Culture   Final    NO GROWTH 5 DAYS Performed at [facility], [place] [address] [place], [place] [address]    Report Status 07/11/2021 FINAL  Final  Culture, blood (routine x 2)     Status: None   Collection Time: 07/06/21  4:59 PM   Specimen: BLOOD  Result Value Ref Range Status   Specimen Description BLOOD RIGHT ANTECUBITAL  Final   Special Requests   Final    BOTTLES DRAWN AEROBIC AND ANAEROBIC Blood Culture adequate volume   Culture   Final    NO GROWTH 5 DAYS Performed at [facility], [place] [address], [place], [place] [address]    Report Status 07/11/2021 FINAL  Final  Resp Panel by RT-PCR (Flu A&B, Covid) Nasopharyngeal Swab     Status: None   Collection Time: 07/10/21  9:09 PM   Specimen: Nasopharyngeal Swab; Nasopharyngeal(NP) swabs in vial transport medium  Result Value Ref Range Status   SARS Coronavirus 2 by RT  PCR NEGATIVE NEGATIVE Final    Comment: (NOTE) SARS-CoV-2 target nucleic acids are NOT DETECTED.  The SARS-CoV-2 RNA is generally detectable in upper respiratory specimens during the acute phase of infection. The lowest concentration of SARS-CoV-2 viral copies this assay can detect is 138 copies/mL. A negative result does not preclude SARS-Cov-2 infection and should not be used as the sole basis for treatment or other patient management decisions. A negative result may occur with  improper specimen collection/handling, submission of specimen other than nasopharyngeal swab, presence of viral mutation(s) within the areas targeted by this assay, and inadequate number of viral copies(<138 copies/mL). A negative result must be combined with clinical observations, patient history, and epidemiological information. The expected result is Negative.  Fact Sheet for Patients:  EntrepreneurPulse.com.au  Fact Sheet for Healthcare Providers:  IncredibleEmployment.be  This test is no t yet approved or cleared by the [place] FDA and  has been authorized for detection and/or diagnosis of SARS-CoV-2 by FDA under an Emergency Use Authorization (EUA). This EUA will remain  in effect (meaning this test can be used) for the duration of the COVID-19 declaration under Section 564(b)(1) of the Act, 21 U.S.C.section 360bbb-3(b)(1), unless the authorization is terminated  or revoked sooner.       Influenza A by PCR NEGATIVE NEGATIVE Final   Influenza B by PCR NEGATIVE NEGATIVE Final    Comment: (NOTE) The Xpert Xpress SARS-CoV-2/FLU/RSV plus assay is intended as an aid in the diagnosis of influenza from Nasopharyngeal swab specimens and should not be used as a sole basis for treatment. Nasal washings and aspirates are unacceptable for Xpert Xpress SARS-CoV-2/FLU/RSV testing.  Fact Sheet for Patients: EntrepreneurPulse.com.au  Fact Sheet for  Healthcare Providers: IncredibleEmployment.be  This test is not yet approved or cleared by the [place] FDA and has been authorized for detection and/or diagnosis of SARS-CoV-2 by FDA under an Emergency Use Authorization (EUA). This EUA will remain in effect (meaning this test can be used) for the duration of the COVID-19 declaration under Section 564(b)(1) of the Act, 21 U.S.C. section 360bbb-3(b)(1), unless the authorization is terminated or revoked.  Performed at [facility], [place] [address], [place],  [address]      Labs: CBC: Recent Labs  Lab 07/10/21 0930 07/11/21 0340 07/11/21 1114 07/11/21 1518 07/12/21 0719 07/13/21 0943  WBC 5.6 5.7  --  5.1 4.4 5.4  HGB 7.9* 8.0* 7.3* 7.9* 7.2* 8.0*  HCT  26.1* 26.5* 24.5* 25.5* 24.0* 26.1*  MCV 99.2 99.3  --  98.1 99.2 98.9  PLT 241 216  --  222 215 233   Basic Metabolic Panel: Recent Labs  Lab 07/07/21 0027 07/08/21 0335 07/10/21 0930 07/11/21 0340 07/11/21 1518 07/12/21 0719 07/13/21 0925  NA 132* 132* 135 138 134* 137 136  K 4.0 3.7 3.6 5.5* 4.0 3.2* 3.5  CL 94* 95* 94* 103 97* 97* 93*  CO2 24 27 31 28 26 29 27   GLUCOSE 86 106* 102* 99 89 82 96  BUN 21* 38* 11 49* 20 23* 29*  CREATININE 4.59* 6.87* 3.53* 2.67* 5.76* 6.72* 7.98*  CALCIUM 9.1 9.4 9.7 9.2 9.3 8.6* 8.2*  MG 2.0 2.1  --   --   --   --   --   PHOS  --   --   --   --  4.3 4.9* 4.9*   Liver Function Tests: Recent Labs  Lab 07/10/21 0930 07/11/21 1518 07/12/21 0719 07/13/21 0925  AST 21  --   --   --   ALT 17  --   --   --   ALKPHOS 65  --   --   --   BILITOT 0.8  --   --   --   PROT 7.3  --   --   --   ALBUMIN 3.7 3.5 3.2* 3.5   CBG: Recent Labs  Lab 07/07/21 2025  GLUCAP 101*    Time spent: 35 minutes  Signed:  Berle Mull  [facility]  07/13/2021

## 2021-07-13 NOTE — Interval H&P Note (Signed)
History and Physical Interval Note:  No changes in clinical status overnight.  Patient completed bowel prep, reporting clear yellow stools.  No blood in stool.  INR 1.7 on Aug 13, should be <1.5 today.  Plan for colonoscopy to assess for potential source of bleeding.    07/13/2021 7:31 AM  Steven Best  has presented today for surgery, with the diagnosis of History adenomatous colon polyps.  Anemia.  GI bleed in setting of elevated INR.  Chronic Coumadin on hold..  The various methods of treatment have been discussed with the patient and family. After consideration of risks, benefits and other options for treatment, the patient has consented to  Procedure(s): COLONOSCOPY WITH PROPOFOL (N/A) as a surgical intervention.  The patient's history has been reviewed, patient examined, no change in status, stable for surgery.  I have reviewed the patient's chart and labs.  Questions were answered to the patient's satisfaction.     Daryel November

## 2021-07-14 ENCOUNTER — Telehealth: Payer: Self-pay | Admitting: Nephrology

## 2021-07-14 ENCOUNTER — Other Ambulatory Visit (HOSPITAL_COMMUNITY): Payer: Self-pay

## 2021-07-14 ENCOUNTER — Encounter (HOSPITAL_COMMUNITY): Payer: Self-pay | Admitting: Gastroenterology

## 2021-07-14 NOTE — Telephone Encounter (Signed)
Transition of care contact from inpatient facility  Date of discharge: 07/13/21 Date of contact: 07/14/21 Method: Phone Spoke to: Patient  Patient contacted to discuss transition of care from recent inpatient hospitalization. Patient was admitted to [facility] from 8/13-8/16/22 with discharge diagnosis of GI bleeding/gastritis.   Medication changes were reviewed.Coumadin was discontinued.To start Eliquis   Patient will follow up with his/her outpatient HD unit on: Thrusday 8/18.

## 2021-07-15 DIAGNOSIS — N2581 Secondary hyperparathyroidism of renal origin: Secondary | ICD-10-CM | POA: Diagnosis not present

## 2021-07-15 DIAGNOSIS — D631 Anemia in chronic kidney disease: Secondary | ICD-10-CM | POA: Diagnosis not present

## 2021-07-15 DIAGNOSIS — E8770 Fluid overload, unspecified: Secondary | ICD-10-CM | POA: Diagnosis not present

## 2021-07-15 DIAGNOSIS — N186 End stage renal disease: Secondary | ICD-10-CM | POA: Diagnosis not present

## 2021-07-15 DIAGNOSIS — Z992 Dependence on renal dialysis: Secondary | ICD-10-CM | POA: Diagnosis not present

## 2021-07-16 ENCOUNTER — Other Ambulatory Visit (HOSPITAL_COMMUNITY): Payer: Self-pay

## 2021-07-16 ENCOUNTER — Telehealth (HOSPITAL_COMMUNITY): Payer: Self-pay | Admitting: Student-PharmD

## 2021-07-16 NOTE — Telephone Encounter (Signed)
Transitions of Care Pharmacy  ° °Call attempted for a pharmacy transitions of care follow-up. HIPAA appropriate voicemail was left with call back information provided.  ° °Call attempt #1. Will follow-up in 2-3 days.  °  °

## 2021-07-19 ENCOUNTER — Telehealth (HOSPITAL_COMMUNITY): Payer: Self-pay

## 2021-07-19 NOTE — Telephone Encounter (Signed)
Transitions of Care Pharmacy   Call attempted for a pharmacy transitions of care follow-up. Unable to leave voicemail.   Call attempt #2. Will follow-up in 2-3 days.    

## 2021-07-20 DIAGNOSIS — N2581 Secondary hyperparathyroidism of renal origin: Secondary | ICD-10-CM | POA: Diagnosis not present

## 2021-07-20 DIAGNOSIS — E8770 Fluid overload, unspecified: Secondary | ICD-10-CM | POA: Diagnosis not present

## 2021-07-20 DIAGNOSIS — D631 Anemia in chronic kidney disease: Secondary | ICD-10-CM | POA: Diagnosis not present

## 2021-07-20 DIAGNOSIS — N186 End stage renal disease: Secondary | ICD-10-CM | POA: Diagnosis not present

## 2021-07-20 DIAGNOSIS — Z992 Dependence on renal dialysis: Secondary | ICD-10-CM | POA: Diagnosis not present

## 2021-07-21 ENCOUNTER — Telehealth (HOSPITAL_COMMUNITY): Payer: Self-pay | Admitting: Pharmacist

## 2021-07-21 NOTE — Telephone Encounter (Signed)
Third and final attempt, unable to reach patient.

## 2021-07-22 ENCOUNTER — Telehealth: Payer: Self-pay

## 2021-07-22 DIAGNOSIS — D631 Anemia in chronic kidney disease: Secondary | ICD-10-CM | POA: Diagnosis not present

## 2021-07-22 DIAGNOSIS — E8770 Fluid overload, unspecified: Secondary | ICD-10-CM | POA: Diagnosis not present

## 2021-07-22 DIAGNOSIS — N2581 Secondary hyperparathyroidism of renal origin: Secondary | ICD-10-CM | POA: Diagnosis not present

## 2021-07-22 DIAGNOSIS — Z992 Dependence on renal dialysis: Secondary | ICD-10-CM | POA: Diagnosis not present

## 2021-07-22 DIAGNOSIS — N186 End stage renal disease: Secondary | ICD-10-CM | POA: Diagnosis not present

## 2021-07-22 NOTE — Telephone Encounter (Signed)
LMOM FOR OVERDUE INR 

## 2021-07-23 DIAGNOSIS — R051 Acute cough: Secondary | ICD-10-CM | POA: Diagnosis not present

## 2021-07-23 DIAGNOSIS — J029 Acute pharyngitis, unspecified: Secondary | ICD-10-CM | POA: Diagnosis not present

## 2021-07-23 DIAGNOSIS — I4821 Permanent atrial fibrillation: Secondary | ICD-10-CM | POA: Diagnosis not present

## 2021-07-23 DIAGNOSIS — K922 Gastrointestinal hemorrhage, unspecified: Secondary | ICD-10-CM | POA: Diagnosis not present

## 2021-07-23 DIAGNOSIS — D6869 Other thrombophilia: Secondary | ICD-10-CM | POA: Diagnosis not present

## 2021-07-27 DIAGNOSIS — Z992 Dependence on renal dialysis: Secondary | ICD-10-CM | POA: Diagnosis not present

## 2021-07-27 DIAGNOSIS — D631 Anemia in chronic kidney disease: Secondary | ICD-10-CM | POA: Diagnosis not present

## 2021-07-27 DIAGNOSIS — N186 End stage renal disease: Secondary | ICD-10-CM | POA: Diagnosis not present

## 2021-07-27 DIAGNOSIS — E8770 Fluid overload, unspecified: Secondary | ICD-10-CM | POA: Diagnosis not present

## 2021-07-27 DIAGNOSIS — N2581 Secondary hyperparathyroidism of renal origin: Secondary | ICD-10-CM | POA: Diagnosis not present

## 2021-07-28 DIAGNOSIS — N2581 Secondary hyperparathyroidism of renal origin: Secondary | ICD-10-CM | POA: Diagnosis not present

## 2021-07-28 DIAGNOSIS — I129 Hypertensive chronic kidney disease with stage 1 through stage 4 chronic kidney disease, or unspecified chronic kidney disease: Secondary | ICD-10-CM | POA: Diagnosis not present

## 2021-07-28 DIAGNOSIS — Z992 Dependence on renal dialysis: Secondary | ICD-10-CM | POA: Diagnosis not present

## 2021-07-28 DIAGNOSIS — E8770 Fluid overload, unspecified: Secondary | ICD-10-CM | POA: Diagnosis not present

## 2021-07-28 DIAGNOSIS — D631 Anemia in chronic kidney disease: Secondary | ICD-10-CM | POA: Diagnosis not present

## 2021-07-28 DIAGNOSIS — N186 End stage renal disease: Secondary | ICD-10-CM | POA: Diagnosis not present

## 2021-07-29 DIAGNOSIS — I129 Hypertensive chronic kidney disease with stage 1 through stage 4 chronic kidney disease, or unspecified chronic kidney disease: Secondary | ICD-10-CM | POA: Diagnosis not present

## 2021-07-29 DIAGNOSIS — N2581 Secondary hyperparathyroidism of renal origin: Secondary | ICD-10-CM | POA: Diagnosis not present

## 2021-07-29 DIAGNOSIS — Z992 Dependence on renal dialysis: Secondary | ICD-10-CM | POA: Diagnosis not present

## 2021-07-29 DIAGNOSIS — N186 End stage renal disease: Secondary | ICD-10-CM | POA: Diagnosis not present

## 2021-07-29 DIAGNOSIS — D631 Anemia in chronic kidney disease: Secondary | ICD-10-CM | POA: Diagnosis not present

## 2021-07-31 DIAGNOSIS — D631 Anemia in chronic kidney disease: Secondary | ICD-10-CM | POA: Diagnosis not present

## 2021-07-31 DIAGNOSIS — Z992 Dependence on renal dialysis: Secondary | ICD-10-CM | POA: Diagnosis not present

## 2021-07-31 DIAGNOSIS — N186 End stage renal disease: Secondary | ICD-10-CM | POA: Diagnosis not present

## 2021-07-31 DIAGNOSIS — N2581 Secondary hyperparathyroidism of renal origin: Secondary | ICD-10-CM | POA: Diagnosis not present

## 2021-08-03 DIAGNOSIS — Z992 Dependence on renal dialysis: Secondary | ICD-10-CM | POA: Diagnosis not present

## 2021-08-03 DIAGNOSIS — D631 Anemia in chronic kidney disease: Secondary | ICD-10-CM | POA: Diagnosis not present

## 2021-08-03 DIAGNOSIS — N186 End stage renal disease: Secondary | ICD-10-CM | POA: Diagnosis not present

## 2021-08-03 DIAGNOSIS — N2581 Secondary hyperparathyroidism of renal origin: Secondary | ICD-10-CM | POA: Diagnosis not present

## 2021-08-05 ENCOUNTER — Other Ambulatory Visit: Payer: Self-pay | Admitting: Nephrology

## 2021-08-05 DIAGNOSIS — N186 End stage renal disease: Secondary | ICD-10-CM | POA: Diagnosis not present

## 2021-08-05 DIAGNOSIS — Z992 Dependence on renal dialysis: Secondary | ICD-10-CM | POA: Diagnosis not present

## 2021-08-05 DIAGNOSIS — N2581 Secondary hyperparathyroidism of renal origin: Secondary | ICD-10-CM | POA: Diagnosis not present

## 2021-08-05 DIAGNOSIS — R229 Localized swelling, mass and lump, unspecified: Secondary | ICD-10-CM

## 2021-08-05 DIAGNOSIS — D631 Anemia in chronic kidney disease: Secondary | ICD-10-CM | POA: Diagnosis not present

## 2021-08-07 DIAGNOSIS — Z992 Dependence on renal dialysis: Secondary | ICD-10-CM | POA: Diagnosis not present

## 2021-08-07 DIAGNOSIS — N186 End stage renal disease: Secondary | ICD-10-CM | POA: Diagnosis not present

## 2021-08-07 DIAGNOSIS — N2581 Secondary hyperparathyroidism of renal origin: Secondary | ICD-10-CM | POA: Diagnosis not present

## 2021-08-07 DIAGNOSIS — D631 Anemia in chronic kidney disease: Secondary | ICD-10-CM | POA: Diagnosis not present

## 2021-08-09 ENCOUNTER — Telehealth: Payer: Self-pay | Admitting: Internal Medicine

## 2021-08-09 DIAGNOSIS — M79604 Pain in right leg: Secondary | ICD-10-CM

## 2021-08-09 DIAGNOSIS — M79605 Pain in left leg: Secondary | ICD-10-CM

## 2021-08-09 NOTE — Telephone Encounter (Signed)
Called Steven Best reviewed MD recommendations.  He is agreeable to plan of care.  He will get a record of his BP from dialysis center tomorrow 08/10/21 and call in the results.  This will determine if Steven Best can take suggested medication diltiazem XL 120 mg PO QD.  Order placed for arterial duplex.  Steven Best told to f/u with PCP.

## 2021-08-09 NOTE — Telephone Encounter (Signed)
Patient called back saying no one has returning his call as of yet. He is expecting a call back today. Please advise

## 2021-08-09 NOTE — Telephone Encounter (Signed)
pt is calling back stating he is having issues w/his legs, from the top of knee to the bottom of his leg stings and burns and feels like it is cutting when he touches it, leg is very swollen and feels like it is going to pop.

## 2021-08-09 NOTE — Telephone Encounter (Signed)
Patient reports that he was changed back to Eliquis. He reports that he was having nodules under his skin on coumadin. States these have improved.  Patient states that he has been having pain in his legs. He states that it has been going on for a while now. He has hat lower extremity US and CT scan done. Advised patient that he needs to get back in touch with his PCP in regards to his legs.  Patient also reports that his A Fib has gotten worse. He states that his HR increases but usually only with exertion. He also reports SOB with exertion as well as fatigue and dizziness at times. He is taking amiodarone 200 mg daily. He does not have any HR Or BP readings to give me.

## 2021-08-09 NOTE — Telephone Encounter (Signed)
Patient c/o Palpitations:  High priority if patient c/o lightheadedness, shortness of breath, or chest pain  How long have you had palpitations/irregular HR/ Afib? Are you having the symptoms now? About a week  Are you currently experiencing lightheadedness, SOB or CP? All three  Do you have a history of afib (atrial fibrillation) or irregular heart rhythm? yes  Have you checked your BP or HR? (document readings if available): no. Patient has it checked regularly at dialysis  Are you experiencing any other symptoms? Patient is having a sensation that goes down the back of his legs. It feels like someone is cutting them with a knife. He says his legs are hot to the touch

## 2021-08-10 DIAGNOSIS — D631 Anemia in chronic kidney disease: Secondary | ICD-10-CM | POA: Diagnosis not present

## 2021-08-10 DIAGNOSIS — Z992 Dependence on renal dialysis: Secondary | ICD-10-CM | POA: Diagnosis not present

## 2021-08-10 DIAGNOSIS — N2581 Secondary hyperparathyroidism of renal origin: Secondary | ICD-10-CM | POA: Diagnosis not present

## 2021-08-10 DIAGNOSIS — N186 End stage renal disease: Secondary | ICD-10-CM | POA: Diagnosis not present

## 2021-08-12 ENCOUNTER — Encounter (HOSPITAL_COMMUNITY): Payer: Self-pay | Admitting: Emergency Medicine

## 2021-08-12 ENCOUNTER — Emergency Department (HOSPITAL_COMMUNITY)
Admission: EM | Admit: 2021-08-12 | Discharge: 2021-08-12 | Disposition: A | Discharge status: Home / Self Care | Payer: Medicare Other | Attending: Emergency Medicine | Admitting: Emergency Medicine

## 2021-08-12 ENCOUNTER — Encounter (HOSPITAL_COMMUNITY): Payer: Self-pay | Admitting: *Deleted

## 2021-08-12 ENCOUNTER — Other Ambulatory Visit: Payer: Self-pay

## 2021-08-12 DIAGNOSIS — G4709 Other insomnia: Secondary | ICD-10-CM

## 2021-08-12 DIAGNOSIS — I120 Hypertensive chronic kidney disease with stage 5 chronic kidney disease or end stage renal disease: Secondary | ICD-10-CM | POA: Diagnosis not present

## 2021-08-12 DIAGNOSIS — F17210 Nicotine dependence, cigarettes, uncomplicated: Secondary | ICD-10-CM | POA: Insufficient documentation

## 2021-08-12 DIAGNOSIS — G4700 Insomnia, unspecified: Secondary | ICD-10-CM | POA: Diagnosis not present

## 2021-08-12 DIAGNOSIS — N186 End stage renal disease: Secondary | ICD-10-CM

## 2021-08-12 DIAGNOSIS — Z992 Dependence on renal dialysis: Secondary | ICD-10-CM | POA: Diagnosis not present

## 2021-08-12 DIAGNOSIS — D649 Anemia, unspecified: Secondary | ICD-10-CM

## 2021-08-12 DIAGNOSIS — R109 Unspecified abdominal pain: Secondary | ICD-10-CM | POA: Insufficient documentation

## 2021-08-12 DIAGNOSIS — I480 Paroxysmal atrial fibrillation: Secondary | ICD-10-CM | POA: Diagnosis not present

## 2021-08-12 DIAGNOSIS — I10 Essential (primary) hypertension: Secondary | ICD-10-CM | POA: Diagnosis not present

## 2021-08-12 DIAGNOSIS — Z743 Need for continuous supervision: Secondary | ICD-10-CM | POA: Diagnosis not present

## 2021-08-12 DIAGNOSIS — Z7901 Long term (current) use of anticoagulants: Secondary | ICD-10-CM | POA: Insufficient documentation

## 2021-08-12 DIAGNOSIS — R6889 Other general symptoms and signs: Secondary | ICD-10-CM | POA: Diagnosis not present

## 2021-08-12 LAB — COMPREHENSIVE METABOLIC PANEL
ALT: 10 U/L (ref 0–44)
AST: 13 U/L — ABNORMAL LOW (ref 15–41)
Albumin: 3.7 g/dL (ref 3.5–5.0)
Alkaline Phosphatase: 58 U/L (ref 38–126)
Anion gap: 17 — ABNORMAL HIGH (ref 5–15)
BUN: 49 mg/dL — ABNORMAL HIGH (ref 6–20)
CO2: 24 mmol/L (ref 22–32)
Calcium: 10.3 mg/dL (ref 8.9–10.3)
Chloride: 90 mmol/L — ABNORMAL LOW (ref 98–111)
Creatinine, Ser: 6.9 mg/dL — ABNORMAL HIGH (ref 0.61–1.24)
GFR, Estimated: 9 mL/min — ABNORMAL LOW (ref >60)
Glucose, Bld: 102 mg/dL — ABNORMAL HIGH (ref 70–99)
Potassium: 3.6 mmol/L (ref 3.5–5.1)
Sodium: 131 mmol/L — ABNORMAL LOW (ref 135–145)
Total Bilirubin: 0.9 mg/dL (ref 0.3–1.2)
Total Protein: 7.3 g/dL (ref 6.5–8.1)

## 2021-08-12 LAB — CBC
HCT: 27.8 % — ABNORMAL LOW (ref 39.0–52.0)
Hemoglobin: 8.4 g/dL — ABNORMAL LOW (ref 13.0–17.0)
MCH: 28.7 pg (ref 26.0–34.0)
MCHC: 30.2 g/dL (ref 30.0–36.0)
MCV: 94.9 fL (ref 80.0–100.0)
Platelets: 191 10*3/uL (ref 150–400)
RBC: 2.93 MIL/uL — ABNORMAL LOW (ref 4.22–5.81)
RDW: 16 % — ABNORMAL HIGH (ref 11.5–15.5)
WBC: 7.2 10*3/uL (ref 4.0–10.5)
nRBC: 0 % (ref 0.0–0.2)

## 2021-08-12 LAB — LIPASE, BLOOD: Lipase: 28 U/L (ref 11–51)

## 2021-08-12 NOTE — Discharge Instructions (Addendum)
It was our pleasure to provide your ER care today - we hope that you feel better.  Contact your dialysis center this AM to arrange for your dialysis later today or tomorrow AM. Make sure not to skip or miss your dialysis.   Make sure to only take your meds as prescribed, and do not exceed the prescribed dose.   Also follow up with your primary care doctor in the next 1-2 weeks.   Return to ER if worse, new symptoms, fevers, chest pain, increased trouble breathing, or other concern.

## 2021-08-12 NOTE — ED Triage Notes (Signed)
Per EMS, pt was at dialysis this morning however he did not receive dialysis b/c he told them that he took an unknown amount of flexeril and 3 of his .5xanax b/c he could not sleep.  He was very clear that he was not suicidal, he just wanted to sleep.  Pt has been having abdominal pain for 2 days and has a CT scheduled for tomorrow.   134/82 90HR 12RR 95% RA  Pt is alert and oriented and fully awake, states he often takes this medication in this way.  Last Full dialysis treatment was Tuesday.
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
## 2021-08-12 NOTE — ED Notes (Signed)
Vital signs documented at 0900 not his vital signs documented on wrong patient

## 2021-08-13 DIAGNOSIS — D631 Anemia in chronic kidney disease: Secondary | ICD-10-CM | POA: Diagnosis not present

## 2021-08-13 DIAGNOSIS — N186 End stage renal disease: Secondary | ICD-10-CM | POA: Diagnosis not present

## 2021-08-13 DIAGNOSIS — N2581 Secondary hyperparathyroidism of renal origin: Secondary | ICD-10-CM | POA: Diagnosis not present

## 2021-08-13 DIAGNOSIS — Z992 Dependence on renal dialysis: Secondary | ICD-10-CM | POA: Diagnosis not present

## 2021-08-18 DIAGNOSIS — R404 Transient alteration of awareness: Secondary | ICD-10-CM | POA: Diagnosis not present

## 2021-08-18 DIAGNOSIS — I499 Cardiac arrhythmia, unspecified: Secondary | ICD-10-CM | POA: Diagnosis not present

## 2021-08-18 DIAGNOSIS — Z743 Need for continuous supervision: Secondary | ICD-10-CM | POA: Diagnosis not present

## 2021-08-18 DIAGNOSIS — E161 Other hypoglycemia: Secondary | ICD-10-CM | POA: Diagnosis not present

## 2021-08-23 ENCOUNTER — Encounter (HOSPITAL_COMMUNITY): Payer: Self-pay | Admitting: *Deleted

## 2021-08-23 NOTE — Telephone Encounter (Signed)
Called pt to get BP readings.  Voicemail is full unable to leave a message.

## 2021-08-26 ENCOUNTER — Inpatient Hospital Stay: Admission: RE | Admit: 2021-08-26 | Payer: Medicare Other | Source: Ambulatory Visit

## 2021-08-28 DIAGNOSIS — 419620001 Death: Secondary | SNOMED CT | POA: Diagnosis not present

## 2021-08-28 DEATH — deceased

## 2021-09-06 ENCOUNTER — Encounter (HOSPITAL_COMMUNITY): Payer: Self-pay | Admitting: *Deleted

## 2021-09-13 ENCOUNTER — Ambulatory Visit: Payer: Self-pay | Admitting: Internal Medicine

## 2021-09-21 ENCOUNTER — Other Ambulatory Visit (HOSPITAL_COMMUNITY): Payer: Self-pay

## 2021-09-21 ENCOUNTER — Other Ambulatory Visit: Payer: Self-pay

## 2021-10-05 ENCOUNTER — Ambulatory Visit: Payer: Medicare Other | Admitting: Internal Medicine
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
